# Patient Record
Sex: Female | Born: 1940 | Race: White | Hispanic: No | Smoking: Never smoker
Health system: Southern US, Community
[De-identification: ages and names within clinical notes are randomized; demographics above are authoritative.]

## PROBLEM LIST (undated history)

## (undated) DIAGNOSIS — C50919 Malignant neoplasm of unspecified site of unspecified female breast: Secondary | ICD-10-CM

## (undated) DIAGNOSIS — K219 Gastro-esophageal reflux disease without esophagitis: Secondary | ICD-10-CM

## (undated) DIAGNOSIS — M1990 Unspecified osteoarthritis, unspecified site: Secondary | ICD-10-CM

## (undated) DIAGNOSIS — S3210XA Unspecified fracture of sacrum, initial encounter for closed fracture: Secondary | ICD-10-CM

## (undated) DIAGNOSIS — I10 Essential (primary) hypertension: Secondary | ICD-10-CM

## (undated) DIAGNOSIS — E785 Hyperlipidemia, unspecified: Secondary | ICD-10-CM

## (undated) DIAGNOSIS — E039 Hypothyroidism, unspecified: Secondary | ICD-10-CM

## (undated) DIAGNOSIS — G3183 Dementia with Lewy bodies: Principal | ICD-10-CM

## (undated) DIAGNOSIS — F0280 Dementia in other diseases classified elsewhere without behavioral disturbance: Secondary | ICD-10-CM

## (undated) HISTORY — PX: CATARACT EXTRACTION W/ INTRAOCULAR LENS IMPLANT: SHX1309

## (undated) HISTORY — PX: ABDOMINAL HYSTERECTOMY: SHX81

## (undated) HISTORY — DX: Malignant neoplasm of unspecified site of unspecified female breast: C50.919

## (undated) HISTORY — PX: MASTECTOMY: SHX3

## (undated) HISTORY — DX: Hypothyroidism, unspecified: E03.9

## (undated) HISTORY — DX: Unspecified fracture of sacrum, initial encounter for closed fracture: S32.10XA

## (undated) HISTORY — DX: Dementia with Lewy bodies: G31.83

## (undated) HISTORY — PX: RETINAL DETACHMENT REPAIR W/ SCLERAL BUCKLE LE: SHX2338

## (undated) HISTORY — DX: Gastro-esophageal reflux disease without esophagitis: K21.9

## (undated) HISTORY — DX: Dementia in other diseases classified elsewhere, unspecified severity, without behavioral disturbance, psychotic disturbance, mood disturbance, and anxiety: F02.80

---

## 2008-01-31 ENCOUNTER — Ambulatory Visit: Payer: Self-pay | Admitting: Ophthalmology

## 2008-02-13 ENCOUNTER — Emergency Department: Payer: Self-pay | Admitting: Emergency Medicine

## 2008-02-14 ENCOUNTER — Other Ambulatory Visit: Payer: Self-pay

## 2008-02-26 ENCOUNTER — Ambulatory Visit: Payer: Self-pay | Admitting: Internal Medicine

## 2008-04-10 ENCOUNTER — Ambulatory Visit: Payer: Self-pay | Admitting: Internal Medicine

## 2008-05-11 ENCOUNTER — Ambulatory Visit: Payer: Self-pay | Admitting: Internal Medicine

## 2008-09-26 ENCOUNTER — Ambulatory Visit: Payer: Self-pay | Admitting: Family Medicine

## 2009-02-23 ENCOUNTER — Ambulatory Visit: Payer: Self-pay | Admitting: Nurse Practitioner

## 2009-07-13 ENCOUNTER — Ambulatory Visit: Payer: Self-pay | Admitting: Unknown Physician Specialty

## 2009-08-23 ENCOUNTER — Ambulatory Visit: Payer: Self-pay | Admitting: Internal Medicine

## 2009-09-15 ENCOUNTER — Ambulatory Visit: Payer: Self-pay | Admitting: Nurse Practitioner

## 2009-09-16 ENCOUNTER — Ambulatory Visit: Payer: Self-pay | Admitting: Unknown Physician Specialty

## 2010-03-04 ENCOUNTER — Ambulatory Visit: Payer: Self-pay | Admitting: Ophthalmology

## 2010-03-10 ENCOUNTER — Ambulatory Visit: Payer: Self-pay | Admitting: Ophthalmology

## 2010-05-18 ENCOUNTER — Ambulatory Visit: Payer: Self-pay | Admitting: Internal Medicine

## 2010-10-28 ENCOUNTER — Ambulatory Visit: Payer: Self-pay | Admitting: Internal Medicine

## 2010-10-28 ENCOUNTER — Emergency Department: Payer: Self-pay | Admitting: Emergency Medicine

## 2010-11-17 ENCOUNTER — Ambulatory Visit: Payer: Self-pay | Admitting: Ophthalmology

## 2011-07-02 ENCOUNTER — Ambulatory Visit: Payer: Self-pay | Admitting: Internal Medicine

## 2011-07-03 ENCOUNTER — Ambulatory Visit: Payer: Self-pay | Admitting: Family Medicine

## 2011-07-07 ENCOUNTER — Ambulatory Visit: Payer: Self-pay | Admitting: Family Medicine

## 2011-11-21 ENCOUNTER — Emergency Department: Payer: Self-pay | Admitting: Emergency Medicine

## 2011-12-05 ENCOUNTER — Ambulatory Visit: Payer: Self-pay | Admitting: Otolaryngology

## 2012-01-22 ENCOUNTER — Ambulatory Visit: Payer: Self-pay | Admitting: Internal Medicine

## 2012-01-22 LAB — URINALYSIS, COMPLETE

## 2012-01-24 LAB — URINE CULTURE

## 2012-07-01 ENCOUNTER — Ambulatory Visit: Payer: Self-pay | Admitting: Family Medicine

## 2012-07-01 LAB — URINALYSIS, COMPLETE
Glucose,UR: NEGATIVE mg/dL (ref 0–75)
Nitrite: NEGATIVE

## 2012-07-02 LAB — URINE CULTURE

## 2012-08-23 ENCOUNTER — Emergency Department: Payer: Self-pay | Admitting: *Deleted

## 2012-09-04 ENCOUNTER — Ambulatory Visit: Payer: Self-pay | Admitting: Family Medicine

## 2012-09-04 ENCOUNTER — Ambulatory Visit: Payer: Self-pay | Admitting: Orthopedic Surgery

## 2013-01-29 ENCOUNTER — Ambulatory Visit: Payer: Self-pay | Admitting: Family Medicine

## 2013-01-29 LAB — URINALYSIS, COMPLETE: WBC UR: >30 /HPF (ref 0–5)

## 2013-09-17 ENCOUNTER — Ambulatory Visit: Payer: Self-pay | Admitting: Family Medicine

## 2013-12-19 DIAGNOSIS — S3210XA Unspecified fracture of sacrum, initial encounter for closed fracture: Secondary | ICD-10-CM

## 2013-12-19 HISTORY — DX: Unspecified fracture of sacrum, initial encounter for closed fracture: S32.10XA

## 2014-01-01 ENCOUNTER — Emergency Department: Payer: Self-pay | Admitting: Emergency Medicine

## 2014-01-01 LAB — CBC WITH DIFFERENTIAL/PLATELET
Basophil #: 0 10*3/uL (ref 0.0–0.1)
Basophil %: 0.2 %
EOS PCT: 0.4 %
Eosinophil #: 0.1 10*3/uL (ref 0.0–0.7)
HCT: 37.5 % (ref 35.0–47.0)
HGB: 13 g/dL (ref 12.0–16.0)
Lymphocyte #: 1 10*3/uL (ref 1.0–3.6)
Lymphocyte %: 6.8 %
MCH: 34 pg (ref 26.0–34.0)
MCHC: 34.6 g/dL (ref 32.0–36.0)
MCV: 98 fL (ref 80–100)
Monocyte #: 0.6 x10 3/mm (ref 0.2–0.9)
Monocyte %: 3.8 %
NEUTROS PCT: 88.8 %
Neutrophil #: 13.4 10*3/uL — ABNORMAL HIGH (ref 1.4–6.5)
Platelet: 238 10*3/uL (ref 150–440)
RBC: 3.81 10*6/uL (ref 3.80–5.20)
RDW: 13.4 % (ref 11.5–14.5)
WBC: 15.1 10*3/uL — AB (ref 3.6–11.0)

## 2014-01-01 LAB — URINALYSIS, COMPLETE
Bilirubin,UR: NEGATIVE
Blood: NEGATIVE
GLUCOSE, UR: NEGATIVE mg/dL (ref 0–75)
Ketone: NEGATIVE
LEUKOCYTE ESTERASE: NEGATIVE
Nitrite: POSITIVE
PROTEIN: NEGATIVE
Ph: 7 (ref 4.5–8.0)
RBC,UR: 1 /HPF (ref 0–5)
Specific Gravity: 1.016 (ref 1.003–1.030)
Squamous Epithelial: 1
WBC UR: 2 /HPF (ref 0–5)

## 2014-01-01 LAB — BASIC METABOLIC PANEL
ANION GAP: 6 — AB (ref 7–16)
BUN: 11 mg/dL (ref 7–18)
CALCIUM: 8.3 mg/dL — AB (ref 8.5–10.1)
CHLORIDE: 106 mmol/L (ref 98–107)
Co2: 25 mmol/L (ref 21–32)
Creatinine: 0.63 mg/dL (ref 0.60–1.30)
EGFR (African American): >60
Glucose: 106 mg/dL — ABNORMAL HIGH (ref 65–99)
Osmolality: 274 (ref 275–301)
Potassium: 3.6 mmol/L (ref 3.5–5.1)
Sodium: 137 mmol/L (ref 136–145)

## 2014-01-01 LAB — CK TOTAL AND CKMB (NOT AT ARMC)
CK, Total: 66 U/L (ref 21–215)
CK-MB: 0.9 ng/mL (ref 0.5–3.6)

## 2014-01-01 LAB — TROPONIN I: Troponin-I: <0.02 ng/mL

## 2014-10-15 ENCOUNTER — Telehealth: Payer: Self-pay | Admitting: Internal Medicine

## 2014-10-15 NOTE — Telephone Encounter (Signed)
Left message on VM asking daughter to return my call.

## 2014-10-15 NOTE — Telephone Encounter (Signed)
Pt'S daugter would like a call back regarding the upcoming appt 11/17. She has a few questions about what will happen at this visit

## 2014-10-16 NOTE — Telephone Encounter (Signed)
.  left message to have patient's daughter return my call.  

## 2014-11-04 ENCOUNTER — Ambulatory Visit (INDEPENDENT_AMBULATORY_CARE_PROVIDER_SITE_OTHER): Payer: Medicare Other | Admitting: Internal Medicine

## 2014-11-04 ENCOUNTER — Encounter (INDEPENDENT_AMBULATORY_CARE_PROVIDER_SITE_OTHER): Payer: Self-pay

## 2014-11-04 ENCOUNTER — Encounter: Payer: Self-pay | Admitting: Internal Medicine

## 2014-11-04 VITALS — BP 120/70 | HR 84 | Temp 97.5°F | Ht 60.0 in | Wt 128.0 lb

## 2014-11-04 DIAGNOSIS — G3183 Dementia with Lewy bodies: Secondary | ICD-10-CM

## 2014-11-04 DIAGNOSIS — S3210XA Unspecified fracture of sacrum, initial encounter for closed fracture: Secondary | ICD-10-CM | POA: Insufficient documentation

## 2014-11-04 DIAGNOSIS — S3210XS Unspecified fracture of sacrum, sequela: Secondary | ICD-10-CM

## 2014-11-04 DIAGNOSIS — E039 Hypothyroidism, unspecified: Secondary | ICD-10-CM | POA: Insufficient documentation

## 2014-11-04 DIAGNOSIS — E890 Postprocedural hypothyroidism: Secondary | ICD-10-CM

## 2014-11-04 DIAGNOSIS — F0280 Dementia in other diseases classified elsewhere without behavioral disturbance: Secondary | ICD-10-CM | POA: Insufficient documentation

## 2014-11-04 DIAGNOSIS — K219 Gastro-esophageal reflux disease without esophagitis: Secondary | ICD-10-CM

## 2014-11-04 DIAGNOSIS — C50919 Malignant neoplasm of unspecified site of unspecified female breast: Secondary | ICD-10-CM | POA: Insufficient documentation

## 2014-11-04 MED ORDER — RIVASTIGMINE TARTRATE 3 MG PO CAPS: 3.0000 mg | ORAL_CAPSULE | Freq: Two times a day (BID) | ORAL | Status: DC

## 2014-11-04 NOTE — Progress Notes (Signed)
Subjective:    Patient ID: Julia Alvarado, female    DOB: [birth year], 73 y.o.   MRN: [id number]  HPI Here to establish Here with daughter Julia Alvarado and friend Julia Alvarado who stays with her 5 days per week Transferring due to referral to me due to her dementia She is home alone at night and weekends--family may stay with her if needed  Diagnosed with dementia -- from about a year ago Was seeing Dr Cathlean Marseilles ([facility] [place]) [place] yet seen neurologist Mild onset---not handling checkbook, repeating herself, etc Now with visual hallucinations--- sees people who are dead (mom and dad, husband, brother) Doesn't scare her Some concern for abnormal gait--Parkinsonism Had MRI of brain recently-- no CVA found  Has chronic pain in legs Since sacrum fracture 1/15 Not clear if gabapentin helps--may reduce some pain  Known hypothyroid--post op Had large cystic changes and it was removed  Breast cancer 06-Apr-2004 Right mastectomy and RT/chemo Then took med for years Sees oncologist in [place] Recent needle biopsies negative  GERD for a long time Quiet on the omeprazole Some dysphagia in past--- controlled now  No current outpatient prescriptions on file prior to visit.   No current facility-administered medications on file prior to visit.    Allergies  Allergen Reactions  . Cefdinir Other (See Comments)    Liquid form causes oral thrush  . Codeine Nausea And Vomiting    nausea  . Ibandronic Acid Nausea Only  . Latex Hives  . Nitrofurantoin     Other reaction(s): Vomiting  . Raloxifene Other (See Comments)    Per pt, "eye doctor said I cannot take"    Past Medical History  Diagnosis Date  . GERD (gastroesophageal reflux disease)   . Hypothyroidism   . Sacral fracture 1/15  . Lewy body dementia without behavioral disturbance   . Breast cancer     Past Surgical History  Procedure Laterality Date  . Mastectomy Right ~2005  . Retinal detachment repair w/ scleral buckle le     . Abdominal hysterectomy    . Cataract extraction w/ intraocular lens implant Right     Family History  Problem Relation Age of Onset  . Heart disease Father   . Cancer Brother     all except 1 died of cancer    History   Social History  . Marital Status: Widowed    Spouse Name: N/A    Number of Children: 2  . Years of Education: N/A   Occupational History  . [employer]     Retired   Social History Main Topics  . Smoking status: Never Smoker   . Smokeless tobacco: Never Used  . Alcohol Use: No  . Drug Use: No  . Sexual Activity: Not on file   Other Topics Concern  . Not on file   Social History Narrative   Widowed 04-06-2009   4 children--- 2 deceased      Has living will   Daughter and son have health care POA   Would accept resuscitation attempts but no prolonged ventilation   No feeding tube   Review of Systems  Constitutional: Negative for fatigue and unexpected weight change.  HENT: Positive for hearing loss. Negative for dental problem.   Eyes: Negative for visual disturbance.  Respiratory: Negative for cough, chest tightness and shortness of breath.   Cardiovascular: Positive for leg swelling. Negative for chest pain and palpitations.  Gastrointestinal: Negative for nausea, constipation and blood in stool.  Genitourinary: Negative for  dysuria, frequency and hematuria.  Musculoskeletal: Positive for back pain. Negative for arthralgias.  Skin: Negative for rash.  Neurological: Positive for dizziness, weakness and headaches. Negative for syncope.       Occ leg weakness  Psychiatric/Behavioral: Negative for sleep disturbance and dysphoric mood. The patient is nervous/anxious.        Regular nerves       Objective:   Physical Exam  Constitutional: She appears well-developed and well-nourished. No distress.  HENT:  Mouth/Throat: Oropharynx is clear and moist. No oropharyngeal exudate.  Neck: Normal range of motion. Neck supple. No thyromegaly present.    Cardiovascular: Normal rate, regular rhythm, normal heart sounds and intact distal pulses.  Exam reveals no gallop.   No murmur heard. Pulmonary/Chest: Effort normal and breath sounds normal. No respiratory distress. She has no wheezes. She has no rales.  Abdominal: Soft. There is no tenderness.  Musculoskeletal: She exhibits no edema or tenderness.  Lymphadenopathy:    She has no cervical adenopathy.  Neurological:  Knows name and birthday, knows address and phone number November 2015 President--- "Obama, ?" D-l-o-r...Marland KitchenMarland Kitchen. "I can't do that" [id number]-?  Mild truncal stiffness. Small steps but not overtly shuffling. No increased tone No tremor  Skin: No rash noted.  Psychiatric: She has a normal mood and affect. Her behavior is normal.          Assessment & Plan:

## 2014-11-04 NOTE — Progress Notes (Signed)
Pre visit review using our clinic review tool, if applicable. No additional management support is needed unless otherwise documented below in the visit note. 

## 2014-11-04 NOTE — Assessment & Plan Note (Signed)
Seems to be euthyroid Will check labs from other doctor

## 2014-11-04 NOTE — Assessment & Plan Note (Signed)
Still with some pain On gabapentin for this Will continue for now

## 2014-11-04 NOTE — Assessment & Plan Note (Signed)
Controlled on PPI °

## 2014-11-04 NOTE — Assessment & Plan Note (Signed)
Fairly classic presentation with memory issues, early hallucinations and Parkinsonism (though mild) Will get records from her previous physician Daughter already consulted with Lutricia Horsfall Will start rivastigmine Early counseling about long term care needs

## 2014-11-06 ENCOUNTER — Telehealth: Payer: Self-pay | Admitting: Internal Medicine

## 2014-12-08 ENCOUNTER — Ambulatory Visit: Payer: Medicare Other | Admitting: Internal Medicine

## 2014-12-09 ENCOUNTER — Telehealth: Payer: Self-pay | Admitting: Internal Medicine

## 2014-12-09 NOTE — Telephone Encounter (Signed)
Left message for daughter patricia paylor to call office

## 2014-12-09 NOTE — Telephone Encounter (Signed)
Patient did not come for their scheduled appointment 12/08/14 for 1 month follow up.  Please let me know if the patient needs to be contacted immediately for follow up or if no follow up is necessary.

## 2014-12-09 NOTE — Telephone Encounter (Signed)
She does need follow up soon You need to work through daughter since patient has dementia

## 2014-12-15 NOTE — Telephone Encounter (Signed)
Left message asking Julia Alvarado to call office

## 2014-12-22 NOTE — Telephone Encounter (Signed)
Left message asking Julia Alvarado to call office

## 2014-12-26 ENCOUNTER — Encounter: Payer: Self-pay | Admitting: Internal Medicine

## 2014-12-26 NOTE — Telephone Encounter (Signed)
Mailed letter °

## 2015-02-18 ENCOUNTER — Ambulatory Visit: Payer: Self-pay | Admitting: Nurse Practitioner

## 2015-06-15 ENCOUNTER — Telehealth: Payer: Self-pay | Admitting: *Deleted

## 2015-06-15 NOTE — Telephone Encounter (Signed)
Message left daughter to return my call about mammo.

## 2015-06-20 ENCOUNTER — Emergency Department: Payer: Medicare Other

## 2015-06-20 ENCOUNTER — Other Ambulatory Visit: Payer: Self-pay

## 2015-06-20 ENCOUNTER — Emergency Department
Admission: EM | Admit: 2015-06-20 | Discharge: 2015-06-20 | Disposition: A | Discharge status: Home / Self Care | Payer: Medicare Other | Attending: Emergency Medicine | Admitting: Emergency Medicine

## 2015-06-20 ENCOUNTER — Encounter: Payer: Self-pay | Admitting: Emergency Medicine

## 2015-06-20 DIAGNOSIS — N390 Urinary tract infection, site not specified: Secondary | ICD-10-CM | POA: Insufficient documentation

## 2015-06-20 DIAGNOSIS — Z91040 Latex allergy status: Secondary | ICD-10-CM | POA: Insufficient documentation

## 2015-06-20 DIAGNOSIS — F0280 Dementia in other diseases classified elsewhere without behavioral disturbance: Secondary | ICD-10-CM | POA: Insufficient documentation

## 2015-06-20 DIAGNOSIS — Z859 Personal history of malignant neoplasm, unspecified: Secondary | ICD-10-CM | POA: Diagnosis not present

## 2015-06-20 DIAGNOSIS — R079 Chest pain, unspecified: Secondary | ICD-10-CM | POA: Insufficient documentation

## 2015-06-20 DIAGNOSIS — Z9011 Acquired absence of right breast and nipple: Secondary | ICD-10-CM | POA: Diagnosis not present

## 2015-06-20 DIAGNOSIS — G3183 Dementia with Lewy bodies: Secondary | ICD-10-CM | POA: Diagnosis not present

## 2015-06-20 DIAGNOSIS — Z79899 Other long term (current) drug therapy: Secondary | ICD-10-CM | POA: Insufficient documentation

## 2015-06-20 LAB — URINALYSIS COMPLETE WITH MICROSCOPIC (ARMC ONLY)
BILIRUBIN URINE: NEGATIVE
GLUCOSE, UA: NEGATIVE mg/dL
Hgb urine dipstick: NEGATIVE
Ketones, ur: NEGATIVE mg/dL
Nitrite: NEGATIVE
Protein, ur: 30 mg/dL — AB
Specific Gravity, Urine: 1.03 (ref 1.005–1.030)
pH: 6 (ref 5.0–8.0)

## 2015-06-20 LAB — CBC
HCT: 34.3 % — ABNORMAL LOW (ref 35.0–47.0)
HEMOGLOBIN: 11.5 g/dL — AB (ref 12.0–16.0)
MCH: 34.2 pg — AB (ref 26.0–34.0)
MCHC: 33.6 g/dL (ref 32.0–36.0)
MCV: 101.6 fL — ABNORMAL HIGH (ref 80.0–100.0)
Platelets: 298 10*3/uL (ref 150–440)
RBC: 3.38 MIL/uL — ABNORMAL LOW (ref 3.80–5.20)
RDW: 14.2 % (ref 11.5–14.5)
WBC: 7.3 10*3/uL (ref 3.6–11.0)

## 2015-06-20 LAB — COMPREHENSIVE METABOLIC PANEL
ALT: 22 U/L (ref 14–54)
ANION GAP: 7 (ref 5–15)
AST: 20 U/L (ref 15–41)
Albumin: 4.4 g/dL (ref 3.5–5.0)
Alkaline Phosphatase: 64 U/L (ref 38–126)
BILIRUBIN TOTAL: 0.4 mg/dL (ref 0.3–1.2)
BUN: 10 mg/dL (ref 6–20)
CALCIUM: 9.3 mg/dL (ref 8.9–10.3)
CO2: 27 mmol/L (ref 22–32)
Chloride: 97 mmol/L — ABNORMAL LOW (ref 101–111)
Creatinine, Ser: 0.79 mg/dL (ref 0.44–1.00)
GLUCOSE: 104 mg/dL — AB (ref 65–99)
Potassium: 4.5 mmol/L (ref 3.5–5.1)
Sodium: 131 mmol/L — ABNORMAL LOW (ref 135–145)
TOTAL PROTEIN: 7.3 g/dL (ref 6.5–8.1)

## 2015-06-20 LAB — TROPONIN I: Troponin I: <0.03 ng/mL (ref <0.031)

## 2015-06-20 MED ORDER — CEPHALEXIN 500 MG PO CAPS
ORAL_CAPSULE | ORAL | Status: AC
  Filled 2015-06-20: qty 1

## 2015-06-20 MED ORDER — CEPHALEXIN 500 MG PO CAPS
500.0000 mg | ORAL_CAPSULE | Freq: Once | ORAL | Status: AC
  Administered 2015-06-20: 500 mg via ORAL

## 2015-06-20 MED ORDER — CEPHALEXIN 500 MG PO CAPS: 500.0000 mg | ORAL_CAPSULE | Freq: Three times a day (TID) | ORAL | Status: AC

## 2015-06-20 MED ORDER — BACITRACIN ZINC 500 UNIT/GM EX OINT: TOPICAL_OINTMENT | CUTANEOUS | Status: AC

## 2015-06-20 NOTE — ED Notes (Signed)
Patient transported to X-ray 

## 2015-06-20 NOTE — ED Notes (Signed)
Pt alert and oriented X4, active, cooperative, pt in NAD. RR even and unlabored, color WNL.  Pt informed to return if any life threatening symptoms occur.  Left with family members who are taking patient back to [facility].

## 2015-06-20 NOTE — ED Notes (Signed)
MD at bedside. 

## 2015-06-20 NOTE — ED Notes (Signed)
States felt "swimmy headed" while at assisted living, states symptoms have subsided

## 2015-06-20 NOTE — ED Notes (Signed)
Pt c/o chest pain that began last night, feeling light headed this AM. State CP is still present. Pt concerned about her bilateral great toes, wrapped with bandaids on arrival, color WNL.

## 2015-06-20 NOTE — ED Provider Notes (Signed)
[facility] Emergency Department Provider Note  ____________________________________________  Time seen: Approximately 150 PM  I have reviewed the triage vital signs and the nursing notes.   HISTORY  Chief Complaint Near Syncope    HPI Julia Alvarado is a 74 y.o. female who is presenting today with multiple complaints. Her triage complaint was chest pain for one day with lightheadedness. She did not have any syncopal episodes. She does not complain of any lightheadedness at this time but is complaining of right-sided chest pain which is constant and over her old surgical site where she had a right-sided mastectomy. She is also asking about her toe pain which is been a chronic issue over the last 2 months. She denies any shortness of breath. She is accompanied by her son and granddaughter who say that she has had worsening dementia over the past several months.They said that she has had urinary tract infections and she is very confused. The son says that she is not acting today like she is having a urinary tract infection despite her multiple complaints.   Past Medical History  Diagnosis Date  . GERD (gastroesophageal reflux disease)   . Hypothyroidism   . Sacral fracture 1/15  . Lewy body dementia without behavioral disturbance   . Breast cancer   . Breast cancer     Patient Active Problem List   Diagnosis Date Noted  . GERD (gastroesophageal reflux disease)   . Hypothyroidism   . Sacral fracture   . Lewy body dementia without behavioral disturbance   . Breast cancer     Past Surgical History  Procedure Laterality Date  . Mastectomy Right ~2005  . Retinal detachment repair w/ scleral buckle le    . Abdominal hysterectomy    . Cataract extraction w/ intraocular lens implant Right   . Mastectomy      Current Outpatient Rx  Name  Route  Sig  Dispense  Refill  . dorzolamide-timolol (COSOPT) 22.3-6.8 MG/ML ophthalmic solution   Right Eye   Place  1 drop into the right eye daily.          Marland Kitchen gabapentin (NEURONTIN) 300 MG capsule   Oral   Take 300 mg by mouth 2 (two) times daily.          Marland Kitchen glucosamine-chondroitin (GLUCOSAMINE-CHONDROITIN DS) 500-400 MG tablet   Oral   Take 1 tablet by mouth 2 (two) times daily.          Marland Kitchen latanoprost (XALATAN) 0.005 % ophthalmic solution   Right Eye   Place 1 drop into the right eye at bedtime. 1 drop nightly.         . Omega-3 Fatty Acids (FISH OIL) 1000 MG CAPS   Oral   Take 2 capsules by mouth 2 (two) times daily.          Marland Kitchen omeprazole (PRILOSEC) 20 MG capsule   Oral   Take 20 mg by mouth 2 (two) times daily before a meal.          . rivastigmine (EXELON) 3 MG capsule   Oral   Take 1 capsule (3 mg total) by mouth 2 (two) times daily.   60 capsule   1   . SYNTHROID 100 MCG tablet   Oral   Take 100 mcg by mouth daily before breakfast.            Allergies Cefdinir; Codeine; Ibandronic acid; Latex; Nitrofurantoin; and Raloxifene  Family History  Problem Relation Age of Onset  .  Heart disease Father   . Cancer Brother     all except 1 died of cancer    Social History History  Substance Use Topics  . Smoking status: Never Smoker   . Smokeless tobacco: Never Used  . Alcohol Use: No    Review of Systems Constitutional: No fever/chills Eyes: No visual changes. ENT: No sore throat. Cardiovascular: As above Respiratory: Denies shortness of breath. Gastrointestinal: No abdominal pain.  No nausea, no vomiting.  No diarrhea.  No constipation. Genitourinary: Negative for dysuria. Musculoskeletal: Negative for back pain. Skin: Negative for rash. Neurological: Negative for headaches, focal weakness or numbness.  10-point ROS otherwise negative.  ____________________________________________   PHYSICAL EXAM:  VITAL SIGNS: ED Triage Vitals  Enc Vitals Group     BP 06/20/15 1255 132/86 mmHg     Pulse Rate 06/20/15 1255 66     Resp 06/20/15 1255 20      Temp 06/20/15 1255 98.3 F (36.8 C)     Temp Source 06/20/15 1255 Oral     SpO2 06/20/15 1255 100 %     Weight 06/20/15 1255 135 lb (61.236 kg)     Height 06/20/15 1255 5\' 3"  (1.6 m)     Head Cir --      Peak Flow --      Pain Score 06/20/15 1256 0     Pain Loc --      Pain Edu? --      Excl. in [place]? --     Constitutional: Alert and oriented. Well appearing and in no acute distress. Eyes: Conjunctivae are normal. PERRL. EOMI. Head: Atraumatic. Nose: No congestion/rhinnorhea. Mouth/Throat: Mucous membranes are moist.  Oropharynx non-erythematous. Neck: No stridor.   Cardiovascular: Normal rate, regular rhythm. Grossly normal heart sounds.  Good peripheral circulation. Tender to palpation over the right-sided mastectomy site. There is no crepitus. No erythema or deformity visualized. Respiratory: Normal respiratory effort.  No retractions. Lungs CTAB. Gastrointestinal: Soft and nontender. No distention. No abdominal bruits. No CVA tenderness. Musculoskeletal: No lower extremity tenderness nor edema.  No joint effusions. No lesions to the feet or toes. Neurologic:  Normal speech and language. No gross focal neurologic deficits are appreciated. Speech is normal. No gait instability. Skin:  Skin is warm, dry and intact. No rash noted. Psychiatric: Mood and affect are normal. Speech and behavior are normal.  ____________________________________________   LABS (all labs ordered are listed, but only abnormal results are displayed)  Labs Reviewed  CBC - Abnormal; Notable for the following:    RBC 3.38 (*)    Hemoglobin 11.5 (*)    HCT 34.3 (*)    MCV 101.6 (*)    MCH 34.2 (*)    All other components within normal limits  COMPREHENSIVE METABOLIC PANEL - Abnormal; Notable for the following:    Sodium 131 (*)    Chloride 97 (*)    Glucose, Bld 104 (*)    All other components within normal limits  URINALYSIS COMPLETEWITH MICROSCOPIC ([facility] ONLY) - Abnormal; Notable for the following:     Color, Urine YELLOW (*)    APPearance CLEAR (*)    Protein, ur 30 (*)    Leukocytes, UA 3+ (*)    Bacteria, UA FEW (*)    Squamous Epithelial / LPF 0-5 (*)    All other components within normal limits  TROPONIN I   ____________________________________________  EKG  ED ECG REPORT I, Doran Stabler, the attending physician, personally viewed and interpreted this ECG.  Date: 06/20/2015  EKG Time: 1308  Rate: 59  Rhythm: sinus bradycardia  Axis: Normal axis  Intervals:none  ST&T Change: No ST elevations or depressions. No abnormal T-wave inversions.  ____________________________________________  RADIOLOGY  Chest x-ray with changes consistent with COPD. I personally reviewed these images. ____________________________________________   PROCEDURES   ____________________________________________   INITIAL IMPRESSION / ASSESSMENT AND PLAN / ED COURSE  Pertinent labs & imaging results that were available during my care of the patient were reviewed by me and considered in my medical decision making (see chart for details).  ----------------------------------------- 3:21 PM on 06/20/2015 -----------------------------------------  Patient with UA consistent with urinary tract infection. We'll treat with Keflex. Patient not complaining of chest pain at all this time. Perseverating over her right great toe where on the medial side she has had procedures done by her podiatrist for ingrown toenails. There appears to be a small amount of dried blood under the medial border of the toe but without raising the nail. There is no surrounding induration or pus. There is no tenderness on palpation. She is asking for something that will help the healing process since her procedure that her podiatrist. I will prescribe her bacitracin. The family was also told that they may be picked up Neosporin over-the-counter. Patient not complaining of chest pain or shortness of breath at all at this  time. Appears well. We'll discharge to home. Heart score of 3 which makes this patient a low risk patient. Will give follow-up with cardiology. ____________________________________________   FINAL CLINICAL IMPRESSION(S) / ED DIAGNOSES  Acute chest pain. Acute urinary tract infection. Initial visit.    Orbie Pyo, MD 06/20/15 [id number]

## 2015-06-23 ENCOUNTER — Telehealth: Payer: Self-pay | Admitting: *Deleted

## 2015-06-23 NOTE — Telephone Encounter (Signed)
Pt is now in [facility] and see doctors making house calls, spoke with grand-daughter.

## 2015-06-24 NOTE — Telephone Encounter (Signed)
Okay I hadn't heard that I don't think  Please take my name off as PCP

## 2015-07-23 ENCOUNTER — Encounter: Payer: Self-pay | Admitting: Emergency Medicine

## 2015-07-23 ENCOUNTER — Emergency Department: Payer: Medicare Other

## 2015-07-23 ENCOUNTER — Emergency Department
Admission: EM | Admit: 2015-07-23 | Discharge: 2015-07-23 | Disposition: A | Discharge status: Home / Self Care | Payer: Medicare Other | Attending: Emergency Medicine | Admitting: Emergency Medicine

## 2015-07-23 DIAGNOSIS — Z79899 Other long term (current) drug therapy: Secondary | ICD-10-CM | POA: Diagnosis not present

## 2015-07-23 DIAGNOSIS — F0280 Dementia in other diseases classified elsewhere without behavioral disturbance: Secondary | ICD-10-CM | POA: Insufficient documentation

## 2015-07-23 DIAGNOSIS — Z791 Long term (current) use of non-steroidal anti-inflammatories (NSAID): Secondary | ICD-10-CM | POA: Diagnosis not present

## 2015-07-23 DIAGNOSIS — Y92128 Other place in nursing home as the place of occurrence of the external cause: Secondary | ICD-10-CM | POA: Diagnosis not present

## 2015-07-23 DIAGNOSIS — Y998 Other external cause status: Secondary | ICD-10-CM | POA: Insufficient documentation

## 2015-07-23 DIAGNOSIS — Z8659 Personal history of other mental and behavioral disorders: Secondary | ICD-10-CM | POA: Diagnosis not present

## 2015-07-23 DIAGNOSIS — S3992XA Unspecified injury of lower back, initial encounter: Secondary | ICD-10-CM | POA: Insufficient documentation

## 2015-07-23 DIAGNOSIS — G3183 Dementia with Lewy bodies: Secondary | ICD-10-CM | POA: Insufficient documentation

## 2015-07-23 DIAGNOSIS — S0990XA Unspecified injury of head, initial encounter: Secondary | ICD-10-CM | POA: Diagnosis present

## 2015-07-23 DIAGNOSIS — Z79811 Long term (current) use of aromatase inhibitors: Secondary | ICD-10-CM | POA: Diagnosis not present

## 2015-07-23 DIAGNOSIS — W01198A Fall on same level from slipping, tripping and stumbling with subsequent striking against other object, initial encounter: Secondary | ICD-10-CM | POA: Diagnosis not present

## 2015-07-23 DIAGNOSIS — S0003XA Contusion of scalp, initial encounter: Secondary | ICD-10-CM | POA: Diagnosis not present

## 2015-07-23 DIAGNOSIS — M545 Low back pain, unspecified: Secondary | ICD-10-CM

## 2015-07-23 DIAGNOSIS — Z91040 Latex allergy status: Secondary | ICD-10-CM | POA: Diagnosis not present

## 2015-07-23 DIAGNOSIS — Y9389 Activity, other specified: Secondary | ICD-10-CM | POA: Diagnosis not present

## 2015-07-23 MED ORDER — ONDANSETRON 4 MG PO TBDP
4.0000 mg | ORAL_TABLET | Freq: Once | ORAL | Status: AC
  Administered 2015-07-23: 4 mg via ORAL
  Filled 2015-07-23: qty 1

## 2015-07-23 MED ORDER — HYDROCODONE-ACETAMINOPHEN 5-325 MG PO TABS
1.0000 | ORAL_TABLET | Freq: Once | ORAL | Status: AC
Start: 2015-07-23 — End: 2015-07-23
  Administered 2015-07-23: 1 via ORAL
  Filled 2015-07-23: qty 1

## 2015-07-23 NOTE — Discharge Instructions (Signed)
Head Injury °You have received a head injury. It does not appear serious at this time. Headaches and vomiting are common following head injury. It should be easy to awaken from sleeping. Sometimes it is necessary for you to stay in the emergency department for a while for observation. Sometimes admission to the hospital may be needed. After injuries such as yours, most problems occur within the first 24 hours, but side effects may occur up to 7-10 days after the injury. It is important for you to carefully monitor your condition and contact your health care provider or seek immediate medical care if there is a change in your condition. °WHAT ARE THE TYPES OF HEAD INJURIES? °Head injuries can be as minor as a bump. Some head injuries can be more severe. More severe head injuries include: °· A jarring injury to the brain (concussion). °· A bruise of the brain (contusion). This mean there is bleeding in the brain that can cause swelling. °· A cracked skull (skull fracture). °· Bleeding in the brain that collects, clots, and forms a bump (hematoma). °WHAT CAUSES A HEAD INJURY? °A serious head injury is most likely to happen to someone who is in a car wreck and is not wearing a seat belt. Other causes of major head injuries include bicycle or motorcycle accidents, sports injuries, and falls. °HOW ARE HEAD INJURIES DIAGNOSED? °A complete history of the event leading to the injury and your current symptoms will be helpful in diagnosing head injuries. Many times, pictures of the brain, such as CT or MRI are needed to see the extent of the injury. Often, an overnight hospital stay is necessary for observation.  °WHEN SHOULD I SEEK IMMEDIATE MEDICAL CARE?  °You should get help right away if: °· You have confusion or drowsiness. °· You feel sick to your stomach (nauseous) or have continued, forceful vomiting. °· You have dizziness or unsteadiness that is getting worse. °· You have severe, continued headaches not relieved by  medicine. Only take over-the-counter or prescription medicines for pain, fever, or discomfort as directed by your health care provider. °· You do not have normal function of the arms or legs or are unable to walk. °· You notice changes in the black spots in the center of the colored part of your eye (pupil). °· You have a clear or bloody fluid coming from your nose or ears. °· You have a loss of vision. °During the next 24 hours after the injury, you must stay with someone who can watch you for the warning signs. This person should contact local emergency services (911 in the [place] if you have seizures, you become unconscious, or you are unable to wake up. °HOW CAN I PREVENT A HEAD INJURY IN THE FUTURE? °The most important factor for preventing major head injuries is avoiding motor vehicle accidents.  To minimize the potential for damage to your head, it is crucial to wear seat belts while riding in motor vehicles. Wearing helmets while bike riding and playing collision sports (like football) is also helpful. Also, avoiding dangerous activities around the house will further help reduce your risk of head injury.  °WHEN CAN I RETURN TO NORMAL ACTIVITIES AND ATHLETICS? °You should be reevaluated by your health care provider before returning to these activities. If you have any of the following symptoms, you should not return to activities or contact sports until 1 week after the symptoms have stopped: °· Persistent headache. °· Dizziness or vertigo. °· Poor attention and concentration. °· Confusion. °·   Memory problems.  Nausea or vomiting.  Fatigue or tire easily.  Irritability.  Intolerant of bright lights or loud noises.  Anxiety or depression.  Disturbed sleep. MAKE SURE YOU:   Understand these instructions.  Will watch your condition.  Will get help right away if you are not doing well or get worse. Document Released: 12/05/2005 Document Revised: 12/10/2013 Document Reviewed:  08/12/2013 [facility] Patient Information 2015 [employer], [place]. This information is not intended to replace advice given to you by your health care provider. Make sure you discuss any questions you have with your health care provider. Return immediately if condition worsens, please take Tylenol or Motrin for pain. Neck primary physician for further outpatient evaluation as needed. No acute abnormalities were cited on CAT scan evaluation of the head, neck, and plain films of the lumbar spine.

## 2015-07-23 NOTE — ED Notes (Signed)
Pt used bedpan 

## 2015-07-23 NOTE — ED Notes (Signed)
Pt returns from CT with family at bedside.

## 2015-07-23 NOTE — ED Notes (Signed)
edp at bedside to clear pt from backboard with assist from 3 other staff members. Pt remains in [facility] until xrays are clear.

## 2015-07-23 NOTE — ED Notes (Signed)
Patient transported to CT. Pt given PO zofran for nausea. Pt continues to have episodes of nausea. Will reassess nausea prior to giving pain meds.

## 2015-07-23 NOTE — ED Notes (Signed)
Pt from [facility] to ed via ems with reports of having a fall. Pt was walking down hall, fell and hit the left side of head on the wall with leaving an indention in the wall. Pt fully immobilzed on arrival to ed with c/o low back pain and headache. Hematoma palpated to left side lateral head, no bleeding noted. Pt alert and oriented to self and place. Pt experienced one episode of nausea on arrival to ed with no vomiting notedl. vss.

## 2015-07-23 NOTE — ED Provider Notes (Signed)
[facility] Emergency Department Provider Note  ____________________________________________  Time seen: 33  I have reviewed the triage vital signs and the nursing notes.   HISTORY  Chief Complaint Fall   { HPI Julia Alvarado is a 74 y.o. female who presents after a non-syncopal fall local nursing facility. Patient apparently turned and lost her balance and struck the left side of her head on a wall showing an indentation in the wall. Patient was transported by EMS on board and collared and brought here to the emergency department. The patient primarily complains of low back pain. According to nursing staff she has a long history of complaints of back pain and there was no obvious indications of direct trauma to the back. Patient also has a known history of dementia and seems to be at her baseline mental status.  {  Past Medical History  Diagnosis Date  . GERD (gastroesophageal reflux disease)   . Hypothyroidism   . Sacral fracture 1/15  . Lewy body dementia without behavioral disturbance   . Breast cancer   . Breast cancer     Patient Active Problem List   Diagnosis Date Noted  . GERD (gastroesophageal reflux disease)   . Hypothyroidism   . Sacral fracture   . Lewy body dementia without behavioral disturbance   . Breast cancer     Past Surgical History  Procedure Laterality Date  . Mastectomy Right ~2005  . Retinal detachment repair w/ scleral buckle le    . Abdominal hysterectomy    . Cataract extraction w/ intraocular lens implant Right   . Mastectomy      Current Outpatient Rx  Name  Route  Sig  Dispense  Refill  . citalopram (CELEXA) 20 MG tablet   Oral   Take 20 mg by mouth daily.         . dorzolamide-timolol (COSOPT) 22.3-6.8 MG/ML ophthalmic solution   Right Eye   Place 1 drop into the right eye 2 (two) times daily.         . fluticasone (FLONASE) 50 MCG/ACT nasal spray   Each Nare   Place into both nostrils daily.          Marland Kitchen gabapentin (NEURONTIN) 100 MG capsule   Oral   Take 100 mg by mouth 3 (three) times daily. Take 1 capsule twice daily and then take 2 capsules at bedtime.         Marland Kitchen glucosamine-chondroitin (GLUCOSAMINE-CHONDROITIN DS) 500-400 MG tablet   Oral   Take 1 tablet by mouth 3 (three) times daily.         . GuaiFENesin (COUGH SYRUP PO)   Oral   Take 10 mLs by mouth every 6 (six) hours as needed.         Marland Kitchen ibuprofen (ADVIL,MOTRIN) 400 MG tablet   Oral   Take 400 mg by mouth 3 (three) times daily.         Marland Kitchen ibuprofen (ADVIL,MOTRIN) 800 MG tablet   Oral   Take 800 mg by mouth every 8 (eight) hours as needed for mild pain.         Marland Kitchen latanoprost (XALATAN) 0.005 % ophthalmic solution   Both Eyes   Place 1 drop into both eyes at bedtime.         Marland Kitchen levothyroxine (SYNTHROID, LEVOTHROID) 100 MCG tablet   Oral   Take 100 mcg by mouth daily before breakfast.         . lisinopril (PRINIVIL,ZESTRIL) 5 MG  tablet   Oral   Take 5 mg by mouth daily.         Marland Kitchen LORazepam (ATIVAN) 0.5 MG tablet   Oral   Take 0.5 mg by mouth every 8 (eight) hours as needed for anxiety.         . magnesium sulfate (EPSOM SALTS) crystals   Topical   Apply 1 application topically 2 (two) times daily. Two times daily for two weeks.         . Melatonin 3 MG CAPS   Oral   Take 1 capsule by mouth at bedtime.         . Omega-3 Fatty Acids (FISH OIL) 1000 MG CAPS   Oral   Take 1 capsule by mouth daily.         . sodium chloride (OCEAN) 0.65 % SOLN nasal spray   Each Nare   Place 1 spray into both nostrils 5 (five) times daily.         . traMADol (ULTRAM) 50 MG tablet   Oral   Take 50 mg by mouth every 6 (six) hours as needed.         . bacitracin ointment      Apply to affected area daily for 1-2 weeks.   30 g   0   . rivastigmine (EXELON) 3 MG capsule   Oral   Take 1 capsule (3 mg total) by mouth 2 (two) times daily.   60 capsule   1     Allergies Cefdinir;  Codeine; Ibandronic acid; Latex; Nitrofurantoin; and Raloxifene  Family History  Problem Relation Age of Onset  . Heart disease Father   . Cancer Brother     all except 1 died of cancer    Social History History  Substance Use Topics  . Smoking status: Never Smoker   . Smokeless tobacco: Never Used  . Alcohol Use: No    Review of Systems  Patient denies any complaints of arm, or thoracic spine pain patient denies any pain or swelling in the upper or lower extremities. There is no previous symptoms such as chest pain or near syncope stated by the patient at the scene. Patient does describe some nausea on arrival.   ____________________________________________   PHYSICAL EXAM:  VITAL SIGNS: ED Triage Vitals  Enc Vitals Group     BP 07/23/15 0902 152/61 mmHg     Pulse Rate 07/23/15 0902 61     Resp 07/23/15 0902 16     Temp 07/23/15 0902 98.2 F (36.8 C)     Temp Source 07/23/15 0902 Oral     SpO2 07/23/15 0902 97 %     Weight 07/23/15 0902 138 lb (62.596 kg)     Height 07/23/15 0902 5\' 6"  (1.676 m)     Head Cir --      Peak Flow --      Pain Score 07/23/15 0905 5     Pain Loc --      Pain Edu? --      Excl. in [place]? --     { Constitutional: Alert and oriented. Well appearing and in no distress. Eyes: Conjunctivae are normal.  ENT   Head: Patient has a 2 cm left parietal hematoma with no overlying bleeding or laceration.  Mouth/Throat: Mucous membranes are moist. Neck is supple. Collar was removed after CAT scan evaluation with no crepitus or tenderness noted. Cardiovascular: Normal rate, regular rhythm. Normal and symmetric distal pulses are present in all extremities.  No murmurs, rubs, or gallops. Respiratory: Normal respiratory effort without tachypnea nor retractions. Breath sounds are clear and equal bilaterally.  Gastrointestinal: Soft and non-tender in all quadrants. No distention. There is no CVA tenderness. Genitourinary: deferred Musculoskeletal:  Nontender with normal range of motion in all extremities. No lower extremity tenderness nor edema. Neurologic:  Normal speech and language. No gross focal neurologic deficits are appreciated. Skin:  Skin is warm, dry and intact. No rash noted. Psychiatric: Patient has baseline dementia and according to the family is at her normal mental status. Patient was log rolled and had palpation of the thoracic or lumbar spine but no crepitus or step-off noted. No posterior contusions or abrasions are noted. ____________________________________________    LABS (pertinent positives/negatives)  Labs Reviewed - No data to display  ____________________________________________   EKG  none  ____________________________________________    RADIOLOGY I have personally reviewed any xrays that were ordered on this patient: Patient had head CT, cervical spine CAT scan and lumbar spine plain films which did not show any significant abnormalities. Of note was a left parietal hematoma.  ____________________________________________   PROCEDURES  Procedure(s) performed: none  Critical Care performed: None  ____________________________________________   INITIAL IMPRESSION / ASSESSMENT AND PLAN / ED COURSE  Pertinent labs & imaging results that were available during my care of the patient were reviewed by me and considered in my medical decision making (see chart for details). Patient's stay was uneventful and she was able to ambulate with assistance. After her x-ray and CAT scan reviewed as collar was removed and the patient was able to demonstrate no significant neuropathy. Patient was taken back to the nursing facility by her family. All questions and concerns were addressed.  ____________________________________________   FINAL CLINICAL IMPRESSION(S) / ED DIAGNOSES  Final diagnoses:  Head trauma, initial encounter  Back pain at L4-L5 level     Daymon Larsen, MD 07/23/15 [phone]

## 2015-07-27 ENCOUNTER — Other Ambulatory Visit
Admission: RE | Admit: 2015-07-27 | Discharge: 2015-07-27 | Disposition: A | Discharge status: Home / Self Care | Payer: Medicare Other | Source: Ambulatory Visit | Attending: Nurse Practitioner | Admitting: Nurse Practitioner

## 2015-07-27 DIAGNOSIS — R410 Disorientation, unspecified: Secondary | ICD-10-CM | POA: Diagnosis present

## 2015-07-27 LAB — URINALYSIS COMPLETE WITH MICROSCOPIC (ARMC ONLY)
Bilirubin Urine: NEGATIVE
GLUCOSE, UA: NEGATIVE mg/dL
HGB URINE DIPSTICK: NEGATIVE
Ketones, ur: NEGATIVE mg/dL
Nitrite: POSITIVE — AB
Protein, ur: NEGATIVE mg/dL
Specific Gravity, Urine: 1.005 (ref 1.005–1.030)
pH: 6 (ref 5.0–8.0)

## 2015-07-29 LAB — URINE CULTURE

## 2015-08-15 ENCOUNTER — Emergency Department
Admission: EM | Admit: 2015-08-15 | Discharge: 2015-08-15 | Disposition: A | Discharge status: Home / Self Care | Payer: Medicare Other | Attending: Emergency Medicine | Admitting: Emergency Medicine

## 2015-08-15 ENCOUNTER — Encounter: Payer: Self-pay | Admitting: Emergency Medicine

## 2015-08-15 ENCOUNTER — Emergency Department: Payer: Medicare Other

## 2015-08-15 DIAGNOSIS — F0280 Dementia in other diseases classified elsewhere without behavioral disturbance: Secondary | ICD-10-CM | POA: Diagnosis not present

## 2015-08-15 DIAGNOSIS — Z79899 Other long term (current) drug therapy: Secondary | ICD-10-CM | POA: Diagnosis not present

## 2015-08-15 DIAGNOSIS — Y9389 Activity, other specified: Secondary | ICD-10-CM | POA: Insufficient documentation

## 2015-08-15 DIAGNOSIS — Y998 Other external cause status: Secondary | ICD-10-CM | POA: Diagnosis not present

## 2015-08-15 DIAGNOSIS — Y9289 Other specified places as the place of occurrence of the external cause: Secondary | ICD-10-CM | POA: Insufficient documentation

## 2015-08-15 DIAGNOSIS — Z792 Long term (current) use of antibiotics: Secondary | ICD-10-CM | POA: Diagnosis not present

## 2015-08-15 DIAGNOSIS — Z791 Long term (current) use of non-steroidal anti-inflammatories (NSAID): Secondary | ICD-10-CM | POA: Diagnosis not present

## 2015-08-15 DIAGNOSIS — S42214A Unspecified nondisplaced fracture of surgical neck of right humerus, initial encounter for closed fracture: Secondary | ICD-10-CM | POA: Diagnosis not present

## 2015-08-15 DIAGNOSIS — S42201A Unspecified fracture of upper end of right humerus, initial encounter for closed fracture: Secondary | ICD-10-CM

## 2015-08-15 DIAGNOSIS — S4991XA Unspecified injury of right shoulder and upper arm, initial encounter: Secondary | ICD-10-CM | POA: Diagnosis present

## 2015-08-15 DIAGNOSIS — Z91040 Latex allergy status: Secondary | ICD-10-CM | POA: Insufficient documentation

## 2015-08-15 DIAGNOSIS — G3183 Dementia with Lewy bodies: Secondary | ICD-10-CM | POA: Diagnosis not present

## 2015-08-15 DIAGNOSIS — W1839XA Other fall on same level, initial encounter: Secondary | ICD-10-CM | POA: Insufficient documentation

## 2015-08-15 MED ORDER — TRAMADOL HCL 50 MG PO TABS: 100.0000 mg | ORAL_TABLET | Freq: Once | ORAL | Status: DC
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
## 2015-08-15 NOTE — Discharge Instructions (Signed)
As we discussed, Julia Alvarado has a fracture of the upper part of her upper arm.  It is not expected that she will need surgery but she needs to wear her shoulder immobilizer to keep the bones together and allow them to begin to heal.  She should follow-up with Dr. Sabra Heck next week, in about 5 days.  Please have her continue taking over-the-counter pain medicine as needed.  He should also try to follow up with her regular doctor on Monday to discuss additional long-term care options given her apparently worsening dementia.   Humerus Fracture, Treated with Immobilization The humerus is the large bone in your upper arm. You have a broken (fractured) humerus. These fractures are easily diagnosed with X-rays. TREATMENT  Simple fractures which will heal without disability are treated with simple immobilization. Immobilization means you will wear a cast, splint, or sling. You have a fracture which will do well with immobilization. The fracture will heal well simply by being held in a good position until it is stable enough to begin range of motion exercises. Do not take part in activities which would further injure your arm.  HOME CARE INSTRUCTIONS   Put ice on the injured area.  Put ice in a plastic bag.  Place a towel between your skin and the bag.  Leave the ice on for 15-20 minutes, 03-04 times a day.  If you have a cast:  Do not scratch the skin under the cast using sharp or pointed objects.  Check the skin around the cast every day. You may put lotion on any red or sore areas.  Keep your cast dry and clean.  If you have a splint:  Wear the splint as directed.  Keep your splint dry and clean.  You may loosen the elastic around the splint if your fingers become numb, tingle, or turn cold or blue.  If you have a sling:  Wear the sling as directed.  Do not put pressure on any part of your cast or splint until it is fully hardened.  Your cast or splint can be protected during  bathing with a plastic bag. Do not lower the cast or splint into water.  Only take over-the-counter or prescription medicines for pain, discomfort, or fever as directed by your caregiver.  Do range of motion exercises as instructed by your caregiver.  Follow up as directed by your caregiver. This is very important in order to avoid permanent injury or disability and chronic pain. SEEK IMMEDIATE MEDICAL CARE IF:   Your skin or nails in the injured arm turn blue or gray.  Your arm feels cold or numb.  You develop severe pain in the injured arm.  You are having problems with the medicines you were given. MAKE SURE YOU:   Understand these instructions.  Will watch your condition.  Will get help right away if you are not doing well or get worse. Document Released: 03/13/2001 Document Revised: 02/27/2012 Document Reviewed: 01/19/2011 [employer] Patient Information 2015 [place], [place]. This information is not intended to replace advice given to you by your health care provider. Make sure you discuss any questions you have with your health care provider.

## 2015-08-15 NOTE — ED Notes (Signed)
Iv reported in computer from July d/c'd from computer

## 2015-08-17 ENCOUNTER — Telehealth: Payer: Self-pay

## 2015-08-17 LAB — HM MAMMOGRAPHY

## 2015-08-17 NOTE — Telephone Encounter (Signed)
Patient declined information on Mammogram.

## 2016-01-01 ENCOUNTER — Other Ambulatory Visit: Payer: Self-pay | Admitting: Unknown Physician Specialty

## 2016-01-01 DIAGNOSIS — R1310 Dysphagia, unspecified: Secondary | ICD-10-CM

## 2016-02-29 ENCOUNTER — Ambulatory Visit
Admission: RE | Admit: 2016-02-29 | Discharge: 2016-02-29 | Disposition: A | Discharge status: Home / Self Care | Payer: Medicare Other | Source: Ambulatory Visit | Attending: Unknown Physician Specialty | Admitting: Unknown Physician Specialty

## 2016-02-29 DIAGNOSIS — R1312 Dysphagia, oropharyngeal phase: Secondary | ICD-10-CM

## 2016-02-29 DIAGNOSIS — R1013 Epigastric pain: Secondary | ICD-10-CM | POA: Diagnosis not present

## 2016-02-29 DIAGNOSIS — R1310 Dysphagia, unspecified: Secondary | ICD-10-CM

## 2016-02-29 NOTE — Therapy (Signed)
[place], [place], [address] Phone: [phone]   Fax:     Modified Barium Swallow  Patient Details  Name: Julia Alvarado MRN: [id number] Date of Birth: 1941-06-26 No Data Recorded  Encounter Date: 02/29/2016      End of Session - 02/29/16 1306    Visit Number 1   Number of Visits 1   Date for SLP Re-Evaluation 02/29/16   SLP Start Time 1207   SLP Stop Time  1250   SLP Time Calculation (min) 43 min   Activity Tolerance Patient tolerated treatment well      Past Medical History  Diagnosis Date  . GERD (gastroesophageal reflux disease)   . Hypothyroidism   . Sacral fracture 1/15  . Lewy body dementia without behavioral disturbance   . Breast cancer   . Breast cancer     Past Surgical History  Procedure Laterality Date  . Mastectomy Right ~2005  . Retinal detachment repair w/ scleral buckle le    . Abdominal hysterectomy    . Cataract extraction w/ intraocular lens implant Right   . Mastectomy      There were no vitals filed for this visit.  Visit Diagnosis: Oropharyngeal dysphagia  Dysphagia - Plan: DG OP Swallowing Func-Medicare/Speech Path, DG OP Swallowing Func-Medicare/Speech Path     Subjective: Patient behavior: (alertness, ability to follow instructions, etc.): confused but able to follow simple directions  Chief complaint: foods and medication sticking in her throat   Objective:  Radiological Procedure: A videoflouroscopic evaluation of oral-preparatory, reflex initiation, and pharyngeal phases of the swallow was performed; as well as a screening of the upper esophageal phase.  I. POSTURE: Upright in MBS chair  II. VIEW: Lateral  III. COMPENSATORY STRATEGIES: alternate liquids and solids  IV. BOLUSES ADMINISTERED:   Thin Liquid: 2 small sips, 2 larger sips   Puree: 2 teaspoon presentations   Mechanical Soft: 1/4 graham cracker in applesauce  V. RESULTS  OF EVALUATION: A. ORAL PREPARATORY PHASE: (The lips, tongue, and velum are observed for strength and coordination)       **Overall Severity Rating: Within normal limits  B. SWALLOW INITIATION/REFLEX: (The reflex is normal if "triggered" by the time the bolus reached the base of the tongue)  **Overall Severity Rating: Mild- triggers at the valleculae  C. PHARYNGEAL PHASE: (Pharyngeal function is normal if the bolus shows rapid, smooth, and continuous transit through the pharynx and there is no pharyngeal residue after the swallow)  **Overall Severity Rating: Mild- decreased pharyngeal pressure generation with mild vallecular residue (puree only)  D. LARYNGEAL PENETRATION: (Material entering into the laryngeal inlet/vestibule but not aspirated) None  E. ASPIRATION: None  F. ESOPHAGEAL PHASE: (Screening of the upper esophagus) no abnormalities seen within the viewable cervical esophagus  ASSESSMENT: 75 year old woman, with history of Lewy Body dementia, GERD, thyroidectomy, and complaints of foods/medicines sticking in her throat, is presenting with mild oropharyngeal dysphagia.  Oral control of the bolus including oral hold, rotary mastication, and anterior to posterior transfer are within normal limits. Timing of the pharyngeal swallow is delayed, triggering at the valleculae.  The patient demonstrates decreased effort of swallowing resulting in decreased pharyngeal pressure generation with pureed solids (resulting in vallecular residue).  The patient demonstrates increased effort of swallow with chewable solid and thin liquids with trace residue post swallow.  Other pharyngeal aspects of the swallow including hyolaryngeal excursion, epiglottic inversion, duration/amplitude of UES opening, and laryngeal vestibule closure  at the height of the swallow are within normal limits.  The patient is at reduced risk for aspiration.  SLP advised caregiver to encourage alternating liquids and solids while eating  and to give meds whole in applesauce with water afterwards.  PLAN/RECOMMENDATIONS:   A. Diet: Regular   B. Swallowing Precautions: Alternate liquids and solids, medication in applesauce with water chaser   C. Recommended consultation to N/A   D. Therapy recommendations N/A   E. Results and recommendations were discussed with patient's caregiver immediately following the study and final report routed to referring MD           G-Codes - Mar 06, 2016 1308    Swallow Discharge Status [phone]) At least 20 percent but less than 40 percent impaired, limited or restricted          Problem List Patient Active Problem List   Diagnosis Date Noted  . GERD (gastroesophageal reflux disease)   . Hypothyroidism   . Sacral fracture ([place])   . Lewy body dementia without behavioral disturbance   . Breast cancer ([place])    Leroy Sea, MS/CCC- SLP  Lou Miner March 06, 2016, 1:11 PM  [facility] [place], [place], [address] Phone: [phone]   Fax:     Name: Julia Alvarado MRN: [id number] Date of Birth: 05/15/1941

## 2016-06-13 NOTE — Discharge Instructions (Signed)

## 2016-06-15 ENCOUNTER — Encounter
Admission: RE | Disposition: A | Discharge status: Home / Self Care | Payer: Self-pay | Source: Ambulatory Visit | Attending: Ophthalmology

## 2016-06-15 ENCOUNTER — Ambulatory Visit: Payer: Medicare Other | Admitting: Anesthesiology

## 2016-06-15 ENCOUNTER — Ambulatory Visit
Admission: RE | Admit: 2016-06-15 | Discharge: 2016-06-15 | Disposition: A | Discharge status: Home / Self Care | Payer: Medicare Other | Source: Ambulatory Visit | Attending: Ophthalmology | Admitting: Ophthalmology

## 2016-06-15 DIAGNOSIS — H2511 Age-related nuclear cataract, right eye: Secondary | ICD-10-CM | POA: Insufficient documentation

## 2016-06-15 DIAGNOSIS — M1990 Unspecified osteoarthritis, unspecified site: Secondary | ICD-10-CM | POA: Insufficient documentation

## 2016-06-15 DIAGNOSIS — Z791 Long term (current) use of non-steroidal anti-inflammatories (NSAID): Secondary | ICD-10-CM | POA: Insufficient documentation

## 2016-06-15 DIAGNOSIS — E039 Hypothyroidism, unspecified: Secondary | ICD-10-CM | POA: Diagnosis not present

## 2016-06-15 DIAGNOSIS — Z7951 Long term (current) use of inhaled steroids: Secondary | ICD-10-CM | POA: Insufficient documentation

## 2016-06-15 DIAGNOSIS — Z79899 Other long term (current) drug therapy: Secondary | ICD-10-CM | POA: Insufficient documentation

## 2016-06-15 DIAGNOSIS — H5703 Miosis: Secondary | ICD-10-CM | POA: Insufficient documentation

## 2016-06-15 HISTORY — PX: CATARACT EXTRACTION W/PHACO: SHX586

## 2016-06-15 HISTORY — DX: Hyperlipidemia, unspecified: E78.5

## 2016-06-15 HISTORY — DX: Unspecified osteoarthritis, unspecified site: M19.90

## 2016-06-15 SURGERY — PHACOEMULSIFICATION, CATARACT, WITH IOL INSERTION
Anesthesia: Monitor Anesthesia Care | Site: Eye | Laterality: Right | Wound class: Clean

## 2016-06-15 MED ORDER — EPINEPHRINE HCL 1 MG/ML IJ SOLN
INTRAOCULAR | Status: DC | PRN
  Administered 2016-06-15: 56 mL via OPHTHALMIC

## 2016-06-15 MED ORDER — MIDAZOLAM HCL 2 MG/2ML IJ SOLN
INTRAMUSCULAR | Status: DC | PRN
  Administered 2016-06-15: .5 mg via INTRAVENOUS

## 2016-06-15 MED ORDER — FENTANYL CITRATE (PF) 100 MCG/2ML IJ SOLN
INTRAMUSCULAR | Status: DC | PRN
  Administered 2016-06-15: 25 ug via INTRAVENOUS

## 2016-06-15 MED ORDER — BALANCED SALT IO SOLN
INTRAOCULAR | Status: DC | PRN
  Administered 2016-06-15: .5 mL via OPHTHALMIC

## 2016-06-15 MED ORDER — POVIDONE-IODINE 5 % OP SOLN
1.0000 "application " | OPHTHALMIC | Status: DC | PRN
  Administered 2016-06-15: 1 via OPHTHALMIC

## 2016-06-15 MED ORDER — TIMOLOL MALEATE 0.5 % OP SOLN
OPHTHALMIC | Status: DC | PRN
  Administered 2016-06-15: 1 [drp] via OPHTHALMIC

## 2016-06-15 MED ORDER — CEFUROXIME OPHTHALMIC INJECTION 1 MG/0.1 ML
INJECTION | OPHTHALMIC | Status: DC | PRN
  Administered 2016-06-15: 0.1 mL via INTRACAMERAL

## 2016-06-15 MED ORDER — ARMC OPHTHALMIC DILATING GEL
1.0000 "application " | OPHTHALMIC | Status: DC | PRN
  Administered 2016-06-15 (×2): 1 via OPHTHALMIC

## 2016-06-15 MED ORDER — NA HYALUR & NA CHOND-NA HYALUR 0.4-0.35 ML IO KIT
PACK | INTRAOCULAR | Status: DC | PRN
  Administered 2016-06-15: 1 mL via INTRAOCULAR

## 2016-06-15 MED ORDER — TETRACAINE HCL 0.5 % OP SOLN
1.0000 [drp] | OPHTHALMIC | Status: DC | PRN
  Administered 2016-06-15: 1 [drp] via OPHTHALMIC

## 2016-06-15 MED ORDER — BRIMONIDINE TARTRATE 0.2 % OP SOLN
OPHTHALMIC | Status: DC | PRN
  Administered 2016-06-15: 1 [drp] via OPHTHALMIC

## 2016-06-15 SURGICAL SUPPLY — 25 items
CANNULA ANT/CHMB 27GA (MISCELLANEOUS) ×3 IMPLANT
CARTRIDGE ABBOTT (MISCELLANEOUS) IMPLANT
GLOVE SURG LX 7.5 STRW (GLOVE) ×2
GLOVE SURG LX STRL 7.5 STRW (GLOVE) ×1 IMPLANT
GLOVE SURG TRIUMPH 8.0 PF LTX (GLOVE) ×3 IMPLANT
GOWN STRL REUS W/ TWL LRG LVL3 (GOWN DISPOSABLE) ×2 IMPLANT
GOWN STRL REUS W/TWL LRG LVL3 (GOWN DISPOSABLE) ×4
LENS IOL ACRYSOF IQ 22.5 (Intraocular Lens) ×3 IMPLANT
MARKER SKIN DUAL TIP RULER LAB (MISCELLANEOUS) ×3 IMPLANT
NDL RETROBULBAR .5 NSTRL (NEEDLE) IMPLANT
NEEDLE FILTER BLUNT 18X 1/2SAF (NEEDLE) ×4
NEEDLE FILTER BLUNT 18X1 1/2 (NEEDLE) ×2 IMPLANT
PACK CATARACT BRASINGTON (MISCELLANEOUS) ×3 IMPLANT
PACK EYE AFTER SURG (MISCELLANEOUS) ×3 IMPLANT
PACK OPTHALMIC (MISCELLANEOUS) ×3 IMPLANT
RING MALYGIN 7.0 (MISCELLANEOUS) ×3 IMPLANT
SUT ETHILON 10-0 CS-B-6CS-B-6 (SUTURE)
SUT VICRYL  9 0 (SUTURE)
SUT VICRYL 9 0 (SUTURE) IMPLANT
SUTURE EHLN 10-0 CS-B-6CS-B-6 (SUTURE) IMPLANT
SYR 3ML LL SCALE MARK (SYRINGE) ×6 IMPLANT
SYR 5ML LL (SYRINGE) ×3 IMPLANT
SYR TB 1ML LUER SLIP (SYRINGE) ×3 IMPLANT
WATER STERILE IRR 250ML POUR (IV SOLUTION) ×3 IMPLANT
WIPE NON LINTING 3.25X3.25 (MISCELLANEOUS) ×3 IMPLANT

## 2016-06-15 NOTE — Op Note (Signed)
OPERATIVE NOTE  Julia Alvarado [id number] 06/15/2016   PREOPERATIVE DIAGNOSIS:    Nuclear Sclerotic Cataract Right eye with miotic pupil.        H25.11  POSTOPERATIVE DIAGNOSIS: Nuclear Sclerotic Cataract Right eye with miotic pupil.          PROCEDURE:  Phacoemusification with posterior chamber intraocular lens placement of the right eye which required pupil stretching with the Malyugin pupil expansion device.  LENS:   Implant Name Type Inv. Item Serial No. Manufacturer Lot No. LRB No. Used  LENS IOL ACRYSOF IQ 22.5 - [id number] Intraocular Lens LENS IOL ACRYSOF IQ 22.5 [id number] ALCON   Right 1       ULTRASOUND TIME: 18.5 % of 1 minutes 7 seconds, CDE 12.4  SURGEON:  Wyonia Hough, MD   ANESTHESIA:  Topical with tetracaine drops and 2% Xylocaine jelly, augmented with 1% preservative-free intracameral lidocaine.   COMPLICATIONS:  None.   DESCRIPTION OF PROCEDURE:  The patient was identified in the holding room and transported to the operating room and placed in the supine position under the operating microscope. Theright eye was identified as the operative eye and it was prepped and draped in the usual sterile ophthalmic fashion.   A 1 millimeter clear-corneal paracentesis was made at the 12:00 position.  0.5 ml of preservative-free 1% lidocaine was injected into the anterior chamber. The anterior chamber was filled with Viscoat viscoelastic.  A 2.4 millimeter keratome was used to make a near-clear corneal incision at the 9:00 position. A Malyugin pupil expander was then placed through the main incision and into the anterior chamber of the eye.  The edge of the iris was secured on the lip of the pupil expander and it was released, thereby expanding the pupil to approximately 7 millimeters for completion of the cataract surgery.  Additional Viscoat was placed in the anterior chamber.  A cystotome and capsulorrhexis forceps were used to make a curvilinear capsulorrhexis.    Balanced salt solution was used to hydrodissect and hydrodelineate the lens nucleus.   Phacoemulsification was used in stop and chop fashion to remove the lens, nucleus and epinucleus.  The remaining cortex was aspirated using the irrigation aspiration handpiece.  Additional Provisc was placed into the eye to distend the capsular bag for lens placement.  A lens was then injected into the capsular bag.  The pupil expanding ring was removed using a Kuglen hook and insertion device. The remaining viscoelastic was aspirated from the capsular bag and the anterior chamber.  The anterior chamber was filled with balanced salt solution to inflate to a physiologic pressure.  Wounds were hydrated with balanced salt solution.  The anterior chamber was inflated to a physiologic pressure with balanced salt solution.  No wound leaks were noted.Cefuroxime 0.1 ml of a 10mg /ml solution was injected into the anterior chamber for a dose of 1 mg of intracameral antibiotic at the completion of the case. Timolol and Brimonidine drops were applied to the eye.  The patient was taken to the recovery room in stable condition without complications of anesthesia or surgery.  Charlee Whitebread 06/15/2016, 10:04 AM

## 2016-06-15 NOTE — H&P (Signed)
  The History and Physical notes are on paper, have been signed, and are to be scanned. The patient remains stable and unchanged from the H&P.   Previous H&P reviewed, patient examined, and there are no changes.  Anarie Kalish 06/15/2016 9:14 AM

## 2016-06-15 NOTE — Transfer of Care (Signed)
Immediate Anesthesia Transfer of Care Note  Patient: Julia Alvarado  Procedure(s) Performed: Procedure(s): CATARACT EXTRACTION PHACO AND INTRAOCULAR LENS PLACEMENT (IOC) (Right)  Patient Location: PACU  Anesthesia Type: MAC  Level of Consciousness: awake, alert  and patient cooperative  Airway and Oxygen Therapy: Patient Spontanous Breathing and Patient connected to supplemental oxygen  Post-op Assessment: Post-op Vital signs reviewed, Patient's Cardiovascular Status Stable, Respiratory Function Stable, Patent Airway and No signs of Nausea or vomiting  Post-op Vital Signs: Reviewed and stable  Complications: No apparent anesthesia complications

## 2016-06-15 NOTE — Anesthesia Postprocedure Evaluation (Signed)
Anesthesia Post Note  Patient: Julia Alvarado  Procedure(s) Performed: Procedure(s) (LRB): CATARACT EXTRACTION PHACO AND INTRAOCULAR LENS PLACEMENT (IOC) (Right)  Patient location during evaluation: PACU Anesthesia Type: MAC Level of consciousness: awake and alert and oriented Pain management: satisfactory to patient Vital Signs Assessment: post-procedure vital signs reviewed and stable Respiratory status: spontaneous breathing, nonlabored ventilation and respiratory function stable Cardiovascular status: blood pressure returned to baseline and stable Postop Assessment: Adequate PO intake and No signs of nausea or vomiting Anesthetic complications: no    Raliegh Ip

## 2016-06-15 NOTE — Anesthesia Preprocedure Evaluation (Signed)
Anesthesia Evaluation  Patient identified by MRN, date of birth, ID band  Reviewed: Allergy & Precautions, H&P , NPO status , Patient's Chart, lab work & pertinent test results  Airway Mallampati: III  TM Distance: >3 FB Neck ROM: full    Dental  (+) Edentulous Upper, Edentulous Lower   Pulmonary    Pulmonary exam normal        Cardiovascular  Rhythm:regular Rate:Normal     Neuro/Psych PSYCHIATRIC DISORDERS    GI/Hepatic GERD  ,  Endo/Other  Hypothyroidism   Renal/GU      Musculoskeletal   Abdominal   Peds  Hematology   Anesthesia Other Findings   Reproductive/Obstetrics                             Anesthesia Physical Anesthesia Plan  ASA: II  Anesthesia Plan: MAC   Post-op Pain Management:    Induction:   Airway Management Planned:   Additional Equipment:   Intra-op Plan:   Post-operative Plan:   Informed Consent: I have reviewed the patients History and Physical, chart, labs and discussed the procedure including the risks, benefits and alternatives for the proposed anesthesia with the patient or authorized representative who has indicated his/her understanding and acceptance.     Plan Discussed with: CRNA  Anesthesia Plan Comments:         Anesthesia Quick Evaluation

## 2016-06-16 ENCOUNTER — Encounter: Payer: Self-pay | Admitting: Ophthalmology

## 2016-06-24 NOTE — Telephone Encounter (Signed)
error 

## 2016-11-24 ENCOUNTER — Encounter: Payer: Self-pay | Admitting: *Deleted

## 2016-11-24 ENCOUNTER — Emergency Department: Payer: Medicare Other

## 2016-11-24 ENCOUNTER — Emergency Department
Admission: EM | Admit: 2016-11-24 | Discharge: 2016-11-24 | Disposition: A | Discharge status: Home / Self Care | Payer: Medicare Other | Attending: Emergency Medicine | Admitting: Emergency Medicine

## 2016-11-24 DIAGNOSIS — Z853 Personal history of malignant neoplasm of breast: Secondary | ICD-10-CM | POA: Insufficient documentation

## 2016-11-24 DIAGNOSIS — I10 Essential (primary) hypertension: Secondary | ICD-10-CM | POA: Diagnosis not present

## 2016-11-24 DIAGNOSIS — E039 Hypothyroidism, unspecified: Secondary | ICD-10-CM | POA: Diagnosis not present

## 2016-11-24 DIAGNOSIS — Z79899 Other long term (current) drug therapy: Secondary | ICD-10-CM | POA: Insufficient documentation

## 2016-11-24 DIAGNOSIS — R1030 Lower abdominal pain, unspecified: Secondary | ICD-10-CM | POA: Diagnosis present

## 2016-11-24 DIAGNOSIS — N390 Urinary tract infection, site not specified: Secondary | ICD-10-CM | POA: Insufficient documentation

## 2016-11-24 DIAGNOSIS — Z791 Long term (current) use of non-steroidal anti-inflammatories (NSAID): Secondary | ICD-10-CM | POA: Insufficient documentation

## 2016-11-24 HISTORY — DX: Essential (primary) hypertension: I10

## 2016-11-24 LAB — LIPASE, BLOOD: LIPASE: 24 U/L (ref 11–51)

## 2016-11-24 LAB — URINALYSIS, COMPLETE (UACMP) WITH MICROSCOPIC
Bacteria, UA: NONE SEEN
Bilirubin Urine: NEGATIVE
Glucose, UA: NEGATIVE mg/dL
Hgb urine dipstick: NEGATIVE
Ketones, ur: 5 mg/dL — AB
Nitrite: NEGATIVE
PH: 5 (ref 5.0–8.0)
Protein, ur: 30 mg/dL — AB
SPECIFIC GRAVITY, URINE: 1.019 (ref 1.005–1.030)

## 2016-11-24 LAB — COMPREHENSIVE METABOLIC PANEL
ALT: 21 U/L (ref 14–54)
AST: 35 U/L (ref 15–41)
Albumin: 4.3 g/dL (ref 3.5–5.0)
Alkaline Phosphatase: 64 U/L (ref 38–126)
Anion gap: 9 (ref 5–15)
BUN: 8 mg/dL (ref 6–20)
CO2: 25 mmol/L (ref 22–32)
CREATININE: 0.81 mg/dL (ref 0.44–1.00)
Calcium: 9.2 mg/dL (ref 8.9–10.3)
Chloride: 97 mmol/L — ABNORMAL LOW (ref 101–111)
GFR calc Af Amer: >60 mL/min (ref >60)
GLUCOSE: 117 mg/dL — AB (ref 65–99)
Potassium: 3.7 mmol/L (ref 3.5–5.1)
Sodium: 131 mmol/L — ABNORMAL LOW (ref 135–145)
Total Bilirubin: 0.2 mg/dL — ABNORMAL LOW (ref 0.3–1.2)
Total Protein: 7.5 g/dL (ref 6.5–8.1)

## 2016-11-24 LAB — CBC
HCT: 37.5 % (ref 35.0–47.0)
Hemoglobin: 12.9 g/dL (ref 12.0–16.0)
MCH: 31.8 pg (ref 26.0–34.0)
MCHC: 34.4 g/dL (ref 32.0–36.0)
MCV: 92.4 fL (ref 80.0–100.0)
PLATELETS: 315 10*3/uL (ref 150–440)
RBC: 4.06 MIL/uL (ref 3.80–5.20)
RDW: 14.4 % (ref 11.5–14.5)
WBC: 6.5 10*3/uL (ref 3.6–11.0)

## 2016-11-24 MED ORDER — IOPAMIDOL (ISOVUE-300) INJECTION 61%
100.0000 mL | Freq: Once | INTRAVENOUS | Status: AC | PRN
  Administered 2016-11-24: 100 mL via INTRAVENOUS

## 2016-11-24 MED ORDER — DEXTROSE 5 % IV SOLN: 1.0000 g | Freq: Once | INTRAVENOUS | Status: DC

## 2016-11-24 MED ORDER — IOPAMIDOL (ISOVUE-300) INJECTION 61%
30.0000 mL | Freq: Once | INTRAVENOUS | Status: AC
  Administered 2016-11-24: 30 mL via ORAL

## 2016-11-24 MED ORDER — CEPHALEXIN 500 MG PO CAPS: 500.0000 mg | ORAL_CAPSULE | Freq: Three times a day (TID) | ORAL | 0 refills | Status: AC

## 2016-11-24 MED ORDER — CEFTRIAXONE SODIUM-DEXTROSE 1-3.74 GM-% IV SOLR
1.0000 g | INTRAVENOUS | Status: DC
  Administered 2016-11-24: 1 g via INTRAVENOUS
  Filled 2016-11-24: qty 50

## 2016-11-24 NOTE — ED Triage Notes (Signed)
Pt lives at [facility], owner explains pt has had a UTI intermittent for the last 4-months, pt complains of lower abdominal pain with dysuria

## 2016-11-24 NOTE — ED Provider Notes (Signed)
[facility] Emergency Department Provider Note  ____________________________________________   I have reviewed the triage vital signs and the nursing notes.   HISTORY  Chief Complaint Abdominal Pain and Dysuria    HPI Julia Alvarado is a 75 y.o. female with a history of chronic recurrent urinary tract infections is been off and on antibiotics for the last 2 months according to patient. Patient and caretakers state that she still has urinary tract symptoms. She was seen by her doctor because she started to have lower abdominal pain. Her doctor that she likely had diverticulitis, started her on Cipro Flagyl. This was 2 days ago. Patient continues with dysuria, feels generally weak, and has lower abdominal discomfort. Denies fever or vomiting.      Past Medical History:  Diagnosis Date  . Arthritis   . Breast cancer ([place])   . Breast cancer ([place])   . GERD (gastroesophageal reflux disease)   . Hyperlipemia   . Hypertension   . Hypothyroidism   . Lewy body dementia without behavioral disturbance   . Sacral fracture ([place]) 1/15    Patient Active Problem List   Diagnosis Date Noted  . GERD (gastroesophageal reflux disease)   . Hypothyroidism   . Sacral fracture ([place])   . Lewy body dementia without behavioral disturbance   . Breast cancer [facility])     Past Surgical History:  Procedure Laterality Date  . ABDOMINAL HYSTERECTOMY    . CATARACT EXTRACTION W/ INTRAOCULAR LENS IMPLANT Right   . CATARACT EXTRACTION W/PHACO Right 06/15/2016   Procedure: CATARACT EXTRACTION PHACO AND INTRAOCULAR LENS PLACEMENT (IOC);  Surgeon: Leandrew Koyanagi, MD;  Location: [facility];  Service: Ophthalmology;  Laterality: Right;  . MASTECTOMY Right ~2005  . MASTECTOMY    . RETINAL DETACHMENT REPAIR W/ SCLERAL BUCKLE LE      Prior to Admission medications   Medication Sig Start Date End Date Taking? Authorizing Provider  citalopram (CELEXA) 20 MG tablet Take 20  mg by mouth daily.    Historical Provider, MD  donepezil (ARICEPT) 5 MG tablet Take 5 mg by mouth at bedtime.    Historical Provider, MD  dorzolamide-timolol (COSOPT) 22.3-6.8 MG/ML ophthalmic solution Place 1 drop into the right eye 2 (two) times daily.    Historical Provider, MD  fluticasone (FLONASE) 50 MCG/ACT nasal spray Place into both nostrils daily.    Historical Provider, MD  gabapentin (NEURONTIN) 100 MG capsule Take 100 mg by mouth 3 (three) times daily. Reported on 06/09/2016    Historical Provider, MD  glucosamine-chondroitin (GLUCOSAMINE-CHONDROITIN DS) 500-400 MG tablet Take 1 tablet by mouth 3 (three) times daily. Reported on 06/09/2016    Historical Provider, MD  GuaiFENesin (COUGH SYRUP PO) Take 10 mLs by mouth every 6 (six) hours as needed. Reported on 06/09/2016    Historical Provider, MD  hydroxypropyl methylcellulose / hypromellose (ISOPTO TEARS / GONIOVISC) 2.5 % ophthalmic solution 1 drop.    Historical Provider, MD  ibuprofen (ADVIL,MOTRIN) 400 MG tablet Take 400 mg by mouth 3 (three) times daily. Reported on 06/09/2016    Historical Provider, MD  ibuprofen (ADVIL,MOTRIN) 800 MG tablet Take 800 mg by mouth every 8 (eight) hours as needed for mild pain. Reported on 06/09/2016    Historical Provider, MD  latanoprost (XALATAN) 0.005 % ophthalmic solution Place 1 drop into both eyes at bedtime. Reported on 06/09/2016    Historical Provider, MD  levothyroxine (SYNTHROID, LEVOTHROID) 100 MCG tablet Take 100 mcg by mouth daily before breakfast. Reported on 06/09/2016  Historical Provider, MD  lisinopril (PRINIVIL,ZESTRIL) 5 MG tablet Take 5 mg by mouth daily. Reported on 06/09/2016    Historical Provider, MD  loratadine (CLARITIN) 10 MG tablet Take 10 mg by mouth daily.    Historical Provider, MD  LORazepam (ATIVAN) 0.5 MG tablet Take 0.5 mg by mouth every 8 (eight) hours as needed for anxiety. Reported on 06/09/2016    Historical Provider, MD  magnesium sulfate (EPSOM SALTS) crystals Apply  1 application topically 2 (two) times daily. Reported on 06/09/2016    Historical Provider, MD  Melatonin 3 MG CAPS Take 1 capsule by mouth at bedtime. Reported on 06/09/2016    Historical Provider, MD  memantine (NAMENDA) 5 MG tablet Take 5 mg by mouth 2 (two) times daily.    Historical Provider, MD  omega-3 acid ethyl esters (LOVAZA) 1 g capsule Take by mouth 2 (two) times daily.    Historical Provider, MD  Omega-3 Fatty Acids (FISH OIL) 1000 MG CAPS Take 1 capsule by mouth daily. Reported on 06/09/2016    Historical Provider, MD  risperiDONE (RISPERDAL) 0.25 MG tablet Take 0.25 mg by mouth 2 (two) times daily.    Historical Provider, MD  rivastigmine (EXELON) 3 MG capsule Take 1 capsule (3 mg total) by mouth 2 (two) times daily. Patient not taking: Reported on 06/09/2016 11/04/14   Venia Carbon, MD  sodium chloride (OCEAN) 0.65 % SOLN nasal spray Place 1 spray into both nostrils 5 (five) times daily. Reported on 06/09/2016    Historical Provider, MD  traMADol (ULTRAM) 50 MG tablet Take 50 mg by mouth every 6 (six) hours as needed. Reported on 06/09/2016    Historical Provider, MD  traZODone (DESYREL) 50 MG tablet Take 50 mg by mouth at bedtime.    Historical Provider, MD  vitamin B-12 (CYANOCOBALAMIN) 1000 MCG tablet Take 1,000 mcg by mouth daily.    Historical Provider, MD    Allergies Cefdinir; Codeine; Ibandronic acid; Latex; Nitrofurantoin; and Raloxifene  Family History  Problem Relation Age of Onset  . Heart disease Father   . Cancer Brother     all except 1 died of cancer    Social History Social History  Substance Use Topics  . Smoking status: Never Smoker  . Smokeless tobacco: Never Used  . Alcohol use No    Review of Systems Constitutional: No fever/chills Eyes: No visual changes. ENT: No sore throat. No stiff neck no neck pain Cardiovascular: Denies chest pain. Respiratory: Denies shortness of breath. Gastrointestinal:   no vomiting.  No diarrhea.  No  constipation. Genitourinary: Positive for dysuria. Musculoskeletal: Negative lower extremity swelling Skin: Negative for rash. Neurological: Negative for severe headaches, focal weakness or numbness. 10-point ROS otherwise negative.  ____________________________________________   PHYSICAL EXAM:  VITAL SIGNS: ED Triage Vitals  Enc Vitals Group     BP 11/24/16 1258 (!) 148/88     Pulse Rate 11/24/16 1258 89     Resp 11/24/16 1258 20     Temp 11/24/16 1258 98 F (36.7 C)     Temp Source 11/24/16 1258 Oral     SpO2 11/24/16 1258 98 %     Weight 11/24/16 1259 125 lb (56.7 kg)     Height 11/24/16 1259 5\' 2"  (1.575 m)     Head Circumference --      Peak Flow --      Pain Score --      Pain Loc --      Pain Edu? --  Excl. in [place]? --     Constitutional: Alert and oriented. Well appearing and in no acute distress. Eyes: Conjunctivae are normal. PERRL. EOMI. Head: Atraumatic. Nose: No congestion/rhinnorhea. Mouth/Throat: Mucous membranes are moist.  Oropharynx non-erythematous. Neck: No stridor.   Nontender with no meningismus Cardiovascular: Normal rate, regular rhythm. Grossly normal heart sounds.  Good peripheral circulation. Respiratory: Normal respiratory effort.  No retractions. Lungs CTAB. Abdominal: Soft and Lower abdominal tenderness noted. No distention. No guarding no rebound Back:  There is no focal tenderness or step off.  there is no midline tenderness there are no lesions noted. there is no CVA tenderness Musculoskeletal: No lower extremity tenderness, no upper extremity tenderness. No joint effusions, no DVT signs strong distal pulses no edema Neurologic:  Normal speech and language. No gross focal neurologic deficits are appreciated.  Skin:  Skin is warm, dry and intact. No rash noted. Psychiatric: Mood and affect are normal. Speech and behavior are normal.  ____________________________________________   LABS (all labs ordered are listed, but only abnormal  results are displayed)  Labs Reviewed  COMPREHENSIVE METABOLIC PANEL - Abnormal; Notable for the following:       Result Value   Sodium 131 (*)    Chloride 97 (*)    Glucose, Bld 117 (*)    Total Bilirubin 0.2 (*)    All other components within normal limits  URINALYSIS, COMPLETE (UACMP) WITH MICROSCOPIC - Abnormal; Notable for the following:    Color, Urine YELLOW (*)    APPearance CLEAR (*)    Ketones, ur 5 (*)    Protein, ur 30 (*)    Leukocytes, UA LARGE (*)    Squamous Epithelial / LPF 0-5 (*)    All other components within normal limits  URINE CULTURE  LIPASE, BLOOD  CBC   ____________________________________________  EKG  I personally interpreted any EKGs ordered by me or triage  ____________________________________________  RADIOLOGY  I reviewed any imaging ordered by me or triage that were performed during my shift and, if possible, patient and/or family made aware of any abnormal findings. ____________________________________________   PROCEDURES  Procedure(s) performed: None  Procedures  Critical Care performed: None  ____________________________________________   INITIAL IMPRESSION / ASSESSMENT AND PLAN / ED COURSE  Pertinent labs & imaging results that were available during my care of the patient were reviewed by me and considered in my medical decision making (see chart for details).  Patient has persistent urinary tract infection. No evidence of diverticulitis fortunately. She is on antibiotics at this time but not getting better and feeling weaker. Review of form or urine cultures suggested this is because of extensive resistance. Patient has allergy to Ceftin however she does not know what it is. Notes suggest that she has a oral thrush with cefdinir. This is not a true allergy. Patient states she has never swelled up.   The bacterium is resistant to Bactrim, also to Cipro it is sensitive to ceftriaxone but she has a cefdinir allergy which is  apparently oral thrush. If that is the only allergy, that should be okay to give her Rocephin and send her home on Keflex, however, the patient does not recall what her allergy is.. It is however sensitive to Zosyn but not oral forms of penicillin. Also appears to be sensitive to nitrofurantoin, however patient cannot tolerate that medication because it causes her to vomit and in any event is a cheer static medication. I think at this point given that there is no true allergy to  cephalosporins, we will give her a shot of Rocephin and see if we can safely get her home on Keflex if she tolerates that.  Clinical Course    ____________________________________________   FINAL CLINICAL IMPRESSION(S) / ED DIAGNOSES  Final diagnoses:  None      This chart was dictated using voice recognition software.  Despite best efforts to proofread,  errors can occur which can change meaning.      Schuyler Amor, MD 11/24/16 [phone]

## 2016-11-24 NOTE — Discharge Instructions (Signed)
Stop taking the Cipro and the Flagyl, started taking the Keflex return to the emergency room for any new or worrisome symptoms include fever, increased pain, weakness or you feel worse in any way.

## 2016-11-24 NOTE — ED Notes (Signed)
Patient transported to CT 

## 2016-11-24 NOTE — ED Notes (Signed)
Reviewed d/c instructions, follow-up care, and prescription with pt and caregiver. Pt and caregiver verbalized understanding

## 2016-11-25 LAB — URINE CULTURE: Culture: NO GROWTH

## 2016-12-16 ENCOUNTER — Inpatient Hospital Stay
Admission: EM | Admit: 2016-12-16 | Discharge: 2016-12-18 | DRG: 644 | Disposition: A | Discharge status: Home / Self Care | Payer: Medicare Other | Attending: Internal Medicine | Admitting: Internal Medicine

## 2016-12-16 ENCOUNTER — Emergency Department: Payer: Medicare Other

## 2016-12-16 DIAGNOSIS — E890 Postprocedural hypothyroidism: Secondary | ICD-10-CM

## 2016-12-16 DIAGNOSIS — K219 Gastro-esophageal reflux disease without esophagitis: Secondary | ICD-10-CM | POA: Diagnosis present

## 2016-12-16 DIAGNOSIS — E039 Hypothyroidism, unspecified: Secondary | ICD-10-CM | POA: Diagnosis present

## 2016-12-16 DIAGNOSIS — E785 Hyperlipidemia, unspecified: Secondary | ICD-10-CM | POA: Diagnosis present

## 2016-12-16 DIAGNOSIS — Z91040 Latex allergy status: Secondary | ICD-10-CM

## 2016-12-16 DIAGNOSIS — Z809 Family history of malignant neoplasm, unspecified: Secondary | ICD-10-CM

## 2016-12-16 DIAGNOSIS — R262 Difficulty in walking, not elsewhere classified: Secondary | ICD-10-CM

## 2016-12-16 DIAGNOSIS — Z888 Allergy status to other drugs, medicaments and biological substances status: Secondary | ICD-10-CM

## 2016-12-16 DIAGNOSIS — E871 Hypo-osmolality and hyponatremia: Secondary | ICD-10-CM

## 2016-12-16 DIAGNOSIS — Z79899 Other long term (current) drug therapy: Secondary | ICD-10-CM

## 2016-12-16 DIAGNOSIS — R531 Weakness: Secondary | ICD-10-CM

## 2016-12-16 DIAGNOSIS — I10 Essential (primary) hypertension: Secondary | ICD-10-CM | POA: Diagnosis present

## 2016-12-16 DIAGNOSIS — F329 Major depressive disorder, single episode, unspecified: Secondary | ICD-10-CM | POA: Diagnosis present

## 2016-12-16 DIAGNOSIS — Z961 Presence of intraocular lens: Secondary | ICD-10-CM | POA: Diagnosis present

## 2016-12-16 DIAGNOSIS — Z9011 Acquired absence of right breast and nipple: Secondary | ICD-10-CM

## 2016-12-16 DIAGNOSIS — Z885 Allergy status to narcotic agent status: Secondary | ICD-10-CM

## 2016-12-16 DIAGNOSIS — M6281 Muscle weakness (generalized): Secondary | ICD-10-CM

## 2016-12-16 DIAGNOSIS — Z8249 Family history of ischemic heart disease and other diseases of the circulatory system: Secondary | ICD-10-CM

## 2016-12-16 DIAGNOSIS — Z853 Personal history of malignant neoplasm of breast: Secondary | ICD-10-CM

## 2016-12-16 DIAGNOSIS — F0280 Dementia in other diseases classified elsewhere without behavioral disturbance: Secondary | ICD-10-CM | POA: Diagnosis present

## 2016-12-16 DIAGNOSIS — G3183 Dementia with Lewy bodies: Secondary | ICD-10-CM | POA: Diagnosis present

## 2016-12-16 LAB — COMPREHENSIVE METABOLIC PANEL WITH GFR
ALT: 14 U/L (ref 14–54)
AST: 19 U/L (ref 15–41)
Albumin: 4.1 g/dL (ref 3.5–5.0)
Alkaline Phosphatase: 50 U/L (ref 38–126)
Anion gap: 8 (ref 5–15)
BUN: 9 mg/dL (ref 6–20)
CO2: 26 mmol/L (ref 22–32)
Calcium: 9.2 mg/dL (ref 8.9–10.3)
Chloride: 93 mmol/L — ABNORMAL LOW (ref 101–111)
Creatinine, Ser: 0.68 mg/dL (ref 0.44–1.00)
GFR calc Af Amer: >60 mL/min
GFR calc non Af Amer: >60 mL/min
Glucose, Bld: 95 mg/dL (ref 65–99)
Potassium: 3.5 mmol/L (ref 3.5–5.1)
Sodium: 127 mmol/L — ABNORMAL LOW (ref 135–145)
Total Bilirubin: 0.8 mg/dL (ref 0.3–1.2)
Total Protein: 6.9 g/dL (ref 6.5–8.1)

## 2016-12-16 LAB — CBC
HCT: 33.4 % — ABNORMAL LOW (ref 35.0–47.0)
Hemoglobin: 11.5 g/dL — ABNORMAL LOW (ref 12.0–16.0)
MCH: 31.9 pg (ref 26.0–34.0)
MCHC: 34.5 g/dL (ref 32.0–36.0)
MCV: 92.7 fL (ref 80.0–100.0)
PLATELETS: 279 10*3/uL (ref 150–440)
RBC: 3.6 MIL/uL — AB (ref 3.80–5.20)
RDW: 15.5 % — AB (ref 11.5–14.5)
WBC: 5.1 10*3/uL (ref 3.6–11.0)

## 2016-12-16 NOTE — ED Notes (Signed)
ED Provider at bedside. 

## 2016-12-16 NOTE — ED Triage Notes (Signed)
Pt was treated for UTI over a week ago and was referred to urology for recurrent infection. Co pain all over, weakness, body aches, no fever.

## 2016-12-17 ENCOUNTER — Emergency Department: Payer: Medicare Other

## 2016-12-17 ENCOUNTER — Encounter: Payer: Self-pay | Admitting: *Deleted

## 2016-12-17 DIAGNOSIS — E871 Hypo-osmolality and hyponatremia: Secondary | ICD-10-CM | POA: Diagnosis present

## 2016-12-17 DIAGNOSIS — E890 Postprocedural hypothyroidism: Secondary | ICD-10-CM | POA: Diagnosis present

## 2016-12-17 DIAGNOSIS — Z79899 Other long term (current) drug therapy: Secondary | ICD-10-CM | POA: Diagnosis not present

## 2016-12-17 DIAGNOSIS — E785 Hyperlipidemia, unspecified: Secondary | ICD-10-CM | POA: Diagnosis present

## 2016-12-17 DIAGNOSIS — Z8249 Family history of ischemic heart disease and other diseases of the circulatory system: Secondary | ICD-10-CM | POA: Diagnosis not present

## 2016-12-17 DIAGNOSIS — K219 Gastro-esophageal reflux disease without esophagitis: Secondary | ICD-10-CM | POA: Diagnosis present

## 2016-12-17 DIAGNOSIS — Z888 Allergy status to other drugs, medicaments and biological substances status: Secondary | ICD-10-CM | POA: Diagnosis not present

## 2016-12-17 DIAGNOSIS — Z853 Personal history of malignant neoplasm of breast: Secondary | ICD-10-CM | POA: Diagnosis not present

## 2016-12-17 DIAGNOSIS — Z809 Family history of malignant neoplasm, unspecified: Secondary | ICD-10-CM | POA: Diagnosis not present

## 2016-12-17 DIAGNOSIS — I10 Essential (primary) hypertension: Secondary | ICD-10-CM | POA: Diagnosis present

## 2016-12-17 DIAGNOSIS — E039 Hypothyroidism, unspecified: Secondary | ICD-10-CM | POA: Diagnosis present

## 2016-12-17 DIAGNOSIS — Z9011 Acquired absence of right breast and nipple: Secondary | ICD-10-CM | POA: Diagnosis not present

## 2016-12-17 DIAGNOSIS — F0280 Dementia in other diseases classified elsewhere without behavioral disturbance: Secondary | ICD-10-CM | POA: Diagnosis present

## 2016-12-17 DIAGNOSIS — Z885 Allergy status to narcotic agent status: Secondary | ICD-10-CM | POA: Diagnosis not present

## 2016-12-17 DIAGNOSIS — G3183 Dementia with Lewy bodies: Secondary | ICD-10-CM | POA: Diagnosis present

## 2016-12-17 DIAGNOSIS — Z91040 Latex allergy status: Secondary | ICD-10-CM | POA: Diagnosis not present

## 2016-12-17 DIAGNOSIS — F329 Major depressive disorder, single episode, unspecified: Secondary | ICD-10-CM | POA: Diagnosis present

## 2016-12-17 DIAGNOSIS — Z961 Presence of intraocular lens: Secondary | ICD-10-CM | POA: Diagnosis present

## 2016-12-17 LAB — HEPATIC FUNCTION PANEL
ALBUMIN: 4 g/dL (ref 3.5–5.0)
ALT: 14 U/L (ref 14–54)
AST: 21 U/L (ref 15–41)
Alkaline Phosphatase: 48 U/L (ref 38–126)
BILIRUBIN TOTAL: 0.6 mg/dL (ref 0.3–1.2)
Bilirubin, Direct: <0.1 mg/dL — ABNORMAL LOW (ref 0.1–0.5)
TOTAL PROTEIN: 6.8 g/dL (ref 6.5–8.1)

## 2016-12-17 LAB — URINALYSIS, COMPLETE (UACMP) WITH MICROSCOPIC
BACTERIA UA: NONE SEEN
Bilirubin Urine: NEGATIVE
Glucose, UA: NEGATIVE mg/dL
Ketones, ur: NEGATIVE mg/dL
Leukocytes, UA: NEGATIVE
Nitrite: NEGATIVE
PROTEIN: NEGATIVE mg/dL
Specific Gravity, Urine: 1.005 (ref 1.005–1.030)
pH: 6 (ref 5.0–8.0)

## 2016-12-17 LAB — TROPONIN I

## 2016-12-17 LAB — LACTIC ACID, PLASMA: Lactic Acid, Venous: 0.8 mmol/L (ref 0.5–1.9)

## 2016-12-17 LAB — TSH: TSH: 47.074 u[IU]/mL — AB (ref 0.350–4.500)

## 2016-12-17 LAB — T4, FREE: Free T4: 0.92 ng/dL (ref 0.61–1.12)

## 2016-12-17 LAB — MRSA PCR SCREENING: MRSA by PCR: NEGATIVE

## 2016-12-17 MED ORDER — HYDRALAZINE HCL 20 MG/ML IJ SOLN
10.0000 mg | Freq: Once | INTRAMUSCULAR | Status: AC
  Administered 2016-12-17: 10 mg via INTRAVENOUS
  Filled 2016-12-17: qty 1

## 2016-12-17 MED ORDER — ACETAMINOPHEN 650 MG RE SUPP: 650.0000 mg | Freq: Four times a day (QID) | RECTAL | Status: DC | PRN

## 2016-12-17 MED ORDER — ENOXAPARIN SODIUM 40 MG/0.4ML ~~LOC~~ SOLN
40.0000 mg | SUBCUTANEOUS | Status: DC
  Administered 2016-12-17: 40 mg via SUBCUTANEOUS
  Filled 2016-12-17: qty 0.4

## 2016-12-17 MED ORDER — DONEPEZIL HCL 5 MG PO TABS
5.0000 mg | ORAL_TABLET | Freq: Every day | ORAL | Status: DC
  Administered 2016-12-17: 5 mg via ORAL
  Filled 2016-12-17: qty 1

## 2016-12-17 MED ORDER — LORATADINE 10 MG PO TABS
10.0000 mg | ORAL_TABLET | Freq: Every day | ORAL | Status: DC
  Administered 2016-12-17 – 2016-12-18 (×2): 10 mg via ORAL
  Filled 2016-12-17 (×2): qty 1

## 2016-12-17 MED ORDER — DOCUSATE SODIUM 100 MG PO CAPS
100.0000 mg | ORAL_CAPSULE | Freq: Two times a day (BID) | ORAL | Status: DC
  Administered 2016-12-17 – 2016-12-18 (×3): 100 mg via ORAL
  Filled 2016-12-17 (×3): qty 1

## 2016-12-17 MED ORDER — POLYVINYL ALCOHOL 1.4 % OP SOLN
1.0000 [drp] | OPHTHALMIC | Status: DC | PRN
  Filled 2016-12-17: qty 15

## 2016-12-17 MED ORDER — IBUPROFEN 400 MG PO TABS
400.0000 mg | ORAL_TABLET | Freq: Three times a day (TID) | ORAL | Status: DC
  Administered 2016-12-17 – 2016-12-18 (×4): 400 mg via ORAL
  Filled 2016-12-17 (×4): qty 1

## 2016-12-17 MED ORDER — SODIUM CHLORIDE 0.9% FLUSH
3.0000 mL | Freq: Two times a day (BID) | INTRAVENOUS | Status: DC
  Administered 2016-12-17 – 2016-12-18 (×3): 3 mL via INTRAVENOUS

## 2016-12-17 MED ORDER — HYDRALAZINE HCL 20 MG/ML IJ SOLN: 10.0000 mg | INTRAMUSCULAR | Status: DC | PRN

## 2016-12-17 MED ORDER — SALINE SPRAY 0.65 % NA SOLN
1.0000 | Freq: Every day | NASAL | Status: DC
  Administered 2016-12-17 – 2016-12-18 (×7): 1 via NASAL
  Filled 2016-12-17: qty 44

## 2016-12-17 MED ORDER — MELATONIN 5 MG PO TABS
2.5000 mg | ORAL_TABLET | Freq: Every day | ORAL | Status: DC
  Administered 2016-12-17: 2.5 mg via ORAL
  Filled 2016-12-17: qty 0.5

## 2016-12-17 MED ORDER — ONDANSETRON HCL 4 MG/2ML IJ SOLN: 4.0000 mg | Freq: Four times a day (QID) | INTRAMUSCULAR | Status: DC | PRN

## 2016-12-17 MED ORDER — GLUCOSAMINE-CHONDROITIN 500-400 MG PO TABS: 1.0000 | ORAL_TABLET | Freq: Three times a day (TID) | ORAL | Status: DC

## 2016-12-17 MED ORDER — LATANOPROST 0.005 % OP SOLN
1.0000 [drp] | Freq: Every day | OPHTHALMIC | Status: DC
  Administered 2016-12-17: 1 [drp] via OPHTHALMIC
  Filled 2016-12-17: qty 2.5

## 2016-12-17 MED ORDER — ONDANSETRON HCL 4 MG PO TABS: 4.0000 mg | ORAL_TABLET | Freq: Four times a day (QID) | ORAL | Status: DC | PRN

## 2016-12-17 MED ORDER — MELATONIN 3 MG PO CAPS: 1.0000 | ORAL_CAPSULE | Freq: Every day | ORAL | Status: DC

## 2016-12-17 MED ORDER — LEVOTHYROXINE SODIUM 50 MCG PO TABS
100.0000 ug | ORAL_TABLET | Freq: Every day | ORAL | Status: DC
  Administered 2016-12-17 – 2016-12-18 (×2): 100 ug via ORAL
  Filled 2016-12-17: qty 2
  Filled 2016-12-17 (×2): qty 1

## 2016-12-17 MED ORDER — VITAMIN B-12 1000 MCG PO TABS
1000.0000 ug | ORAL_TABLET | Freq: Every day | ORAL | Status: DC
  Administered 2016-12-17 – 2016-12-18 (×2): 1000 ug via ORAL
  Filled 2016-12-17 (×2): qty 1

## 2016-12-17 MED ORDER — MEMANTINE HCL 5 MG PO TABS
5.0000 mg | ORAL_TABLET | Freq: Two times a day (BID) | ORAL | Status: DC
  Administered 2016-12-17 – 2016-12-18 (×3): 5 mg via ORAL
  Filled 2016-12-17 (×3): qty 1

## 2016-12-17 MED ORDER — LEVOTHYROXINE SODIUM 100 MCG IV SOLR
25.0000 ug | Freq: Once | INTRAVENOUS | Status: AC
  Administered 2016-12-17: 25 ug via INTRAVENOUS
  Filled 2016-12-17: qty 5

## 2016-12-17 MED ORDER — TRAZODONE HCL 50 MG PO TABS
25.0000 mg | ORAL_TABLET | Freq: Every evening | ORAL | Status: DC | PRN
  Administered 2016-12-17 (×2): 25 mg via ORAL
  Filled 2016-12-17 (×2): qty 1

## 2016-12-17 MED ORDER — OMEGA-3-ACID ETHYL ESTERS 1 G PO CAPS
1.0000 g | ORAL_CAPSULE | Freq: Every day | ORAL | Status: DC
  Administered 2016-12-17 – 2016-12-18 (×2): 1 g via ORAL
  Filled 2016-12-17 (×2): qty 1

## 2016-12-17 MED ORDER — DORZOLAMIDE HCL-TIMOLOL MAL 2-0.5 % OP SOLN
1.0000 [drp] | Freq: Two times a day (BID) | OPHTHALMIC | Status: DC
  Administered 2016-12-17 – 2016-12-18 (×3): 1 [drp] via OPHTHALMIC
  Filled 2016-12-17: qty 10

## 2016-12-17 MED ORDER — HYPROMELLOSE (GONIOSCOPIC) 2.5 % OP SOLN: 1.0000 [drp] | OPHTHALMIC | Status: DC | PRN

## 2016-12-17 MED ORDER — CITALOPRAM HYDROBROMIDE 20 MG PO TABS
20.0000 mg | ORAL_TABLET | Freq: Every day | ORAL | Status: DC
  Administered 2016-12-17 – 2016-12-18 (×2): 20 mg via ORAL
  Filled 2016-12-17 (×2): qty 1

## 2016-12-17 MED ORDER — FLUTICASONE PROPIONATE 50 MCG/ACT NA SUSP
1.0000 | Freq: Every day | NASAL | Status: DC
  Administered 2016-12-17 – 2016-12-18 (×2): 1 via NASAL
  Filled 2016-12-17: qty 16

## 2016-12-17 MED ORDER — ACETAMINOPHEN 325 MG PO TABS
650.0000 mg | ORAL_TABLET | Freq: Four times a day (QID) | ORAL | Status: DC | PRN
  Administered 2016-12-17: 650 mg via ORAL
  Filled 2016-12-17: qty 2

## 2016-12-17 MED ORDER — RISPERIDONE 1 MG PO TABS: 0.5000 mg | ORAL_TABLET | Freq: Two times a day (BID) | ORAL | Status: DC | PRN

## 2016-12-17 NOTE — ED Provider Notes (Signed)
[facility] Emergency Department Provider Note   ____________________________________________   First MD Initiated Contact with Patient 12/16/16 2328     (approximate)  I have reviewed the triage vital signs and the nursing notes.   HISTORY  Chief Complaint Weakness    HPI Julia Alvarado is a 75 y.o. female who comes into the hospital today with weakness. According to the patient's caregivers she has had a UTI for the past few months. She has been steadily getting weaker and weaker. She was here about 2-3 weeks ago and found to have a UTI. The patient has not had a clear up of her symptoms. She has seen her primary care physician and was told that she still had a UTI. The patient has been referred to urology. She has an appointment on January 19 but has been more disoriented with some increased weakness and decreased appetite over the last 2 months. The patient may have a good day that is worse. She has been on Cipro, Keflex, fosfomycin but has not gotten any better. The patient has not had any fevers but has had some nausea with diarrhea dizziness and leg pain. The patient was brought in for further evaluation of these symptoms.   Past Medical History:  Diagnosis Date  . Arthritis   . Breast cancer ([place])   . Breast cancer ([place])   . GERD (gastroesophageal reflux disease)   . Hyperlipemia   . Hypertension   . Hypothyroidism   . Lewy body dementia without behavioral disturbance   . Sacral fracture ([place]) 1/15    Patient Active Problem List   Diagnosis Date Noted  . GERD (gastroesophageal reflux disease)   . Hypothyroidism   . Sacral fracture ([place])   . Lewy body dementia without behavioral disturbance   . Breast cancer [facility])     Past Surgical History:  Procedure Laterality Date  . ABDOMINAL HYSTERECTOMY    . CATARACT EXTRACTION W/ INTRAOCULAR LENS IMPLANT Right   . CATARACT EXTRACTION W/PHACO Right 06/15/2016   Procedure: CATARACT EXTRACTION  PHACO AND INTRAOCULAR LENS PLACEMENT (IOC);  Surgeon: Leandrew Koyanagi, MD;  Location: [place];  Service: Ophthalmology;  Laterality: Right;  . MASTECTOMY Right ~2005  . MASTECTOMY    . RETINAL DETACHMENT REPAIR W/ SCLERAL BUCKLE LE      Prior to Admission medications   Medication Sig Start Date End Date Taking? Authorizing Provider  citalopram (CELEXA) 20 MG tablet Take 20 mg by mouth daily.   Yes Historical Provider, MD  donepezil (ARICEPT) 5 MG tablet Take 5 mg by mouth at bedtime.   Yes Historical Provider, MD  dorzolamide-timolol (COSOPT) 22.3-6.8 MG/ML ophthalmic solution Place 1 drop into the right eye 2 (two) times daily.   Yes Historical Provider, MD  fluticasone (FLONASE) 50 MCG/ACT nasal spray Place into both nostrils daily.   Yes Historical Provider, MD  glucosamine-chondroitin (GLUCOSAMINE-CHONDROITIN DS) 500-400 MG tablet Take 1 tablet by mouth 3 (three) times daily. Reported on 06/09/2016   Yes Historical Provider, MD  hydroxypropyl methylcellulose / hypromellose (ISOPTO TEARS / GONIOVISC) 2.5 % ophthalmic solution Place 1 drop into both eyes as needed for dry eyes.    Yes Historical Provider, MD  ibuprofen (ADVIL,MOTRIN) 400 MG tablet Take 400 mg by mouth 3 (three) times daily. Reported on 06/09/2016   Yes Historical Provider, MD  latanoprost (XALATAN) 0.005 % ophthalmic solution Place 1 drop into both eyes at bedtime. Reported on 06/09/2016   Yes Historical Provider, MD  levothyroxine (SYNTHROID,  LEVOTHROID) 100 MCG tablet Take 100 mcg by mouth daily before breakfast. Reported on 06/09/2016   Yes Historical Provider, MD  loratadine (CLARITIN) 10 MG tablet Take 10 mg by mouth daily.   Yes Historical Provider, MD  Melatonin 3 MG CAPS Take 1 capsule by mouth at bedtime. Reported on 06/09/2016   Yes Historical Provider, MD  memantine (NAMENDA) 5 MG tablet Take 5 mg by mouth 2 (two) times daily.   Yes Historical Provider, MD  Omega-3 Fatty Acids (FISH OIL) 1000 MG CAPS Take  1 capsule by mouth daily. Reported on 06/09/2016   Yes Historical Provider, MD  risperiDONE (RISPERDAL) 0.25 MG tablet Take 0.5 mg by mouth 2 (two) times daily.    Yes Historical Provider, MD  sodium chloride (OCEAN) 0.65 % SOLN nasal spray Place 1 spray into both nostrils 5 (five) times daily. Reported on 06/09/2016   Yes Historical Provider, MD  traZODone (DESYREL) 50 MG tablet Take 25 mg by mouth at bedtime.    Yes Historical Provider, MD  vitamin B-12 (CYANOCOBALAMIN) 1000 MCG tablet Take 1,000 mcg by mouth daily.   Yes Historical Provider, MD  rivastigmine (EXELON) 3 MG capsule Take 1 capsule (3 mg total) by mouth 2 (two) times daily. Patient not taking: Reported on 12/17/2016 11/04/14   Venia Carbon, MD    Allergies Cefdinir; Codeine; Ibandronic acid; Latex; Nitrofurantoin; and Raloxifene  Family History  Problem Relation Age of Onset  . Heart disease Father   . Cancer Brother     all except 1 died of cancer    Social History Social History  Substance Use Topics  . Smoking status: Never Smoker  . Smokeless tobacco: Never Used  . Alcohol use No    Review of Systems Constitutional: No fever/chills Eyes: No visual changes. ENT: No sore throat. Cardiovascular: Denies chest pain. Respiratory: Denies shortness of breath. Gastrointestinal: No abdominal pain.  No nausea, no vomiting.  No diarrhea.  No constipation. Genitourinary: Negative for dysuria. Musculoskeletal: Negative for back pain. Skin: Negative for rash. Neurological: Generalized weakness  10-point ROS otherwise negative.  ____________________________________________   PHYSICAL EXAM:  VITAL SIGNS: ED Triage Vitals  Enc Vitals Group     BP 12/16/16 1932 (!) 163/68     Pulse Rate 12/16/16 1932 60     Resp --      Temp 12/16/16 1932 98.1 F (36.7 C)     Temp Source 12/16/16 1932 Oral     SpO2 12/16/16 1932 98 %     Weight 12/16/16 1939 135 lb (61.2 kg)     Height --      Head Circumference --       Peak Flow --      Pain Score 12/16/16 1938 5     Pain Loc --      Pain Edu? --      Excl. in [place]? --     Constitutional: Alert and Disoriented as is baseline. Well appearing and in no acute distress. Eyes: Conjunctivae are normal. PERRL. EOMI. Head: Atraumatic. Nose: No congestion/rhinnorhea. Mouth/Throat: Mucous membranes are moist.  Oropharynx non-erythematous. Cardiovascular: Normal rate, regular rhythm. Grossly normal heart sounds.  Good peripheral circulation. Respiratory: Normal respiratory effort.  No retractions. Lungs CTAB. Gastrointestinal: Soft With some mild left lower quadrant tenderness to palpation. No distention. Positive bowel sounds Musculoskeletal: No lower extremity tenderness nor edema.   Neurologic:  Normal speech and language.  Skin:  Skin is warm, dry and intact.  Psychiatric: Mood and affect are  normal.   ____________________________________________   LABS (all labs ordered are listed, but only abnormal results are displayed)  Labs Reviewed  CBC - Abnormal; Notable for the following:       Result Value   RBC 3.60 (*)    Hemoglobin 11.5 (*)    HCT 33.4 (*)    RDW 15.5 (*)    All other components within normal limits  COMPREHENSIVE METABOLIC PANEL - Abnormal; Notable for the following:    Sodium 127 (*)    Chloride 93 (*)    All other components within normal limits  URINALYSIS, COMPLETE (UACMP) WITH MICROSCOPIC - Abnormal; Notable for the following:    Color, Urine STRAW (*)    APPearance CLEAR (*)    Hgb urine dipstick SMALL (*)    Squamous Epithelial / LPF 0-5 (*)    All other components within normal limits  HEPATIC FUNCTION PANEL - Abnormal; Notable for the following:    Bilirubin, Direct <0.1 (*)    All other components within normal limits  TSH - Abnormal; Notable for the following:    TSH 47.074 (*)    All other components within normal limits  TROPONIN I  LACTIC ACID, PLASMA  T4    ____________________________________________  EKG  ED ECG REPORT I, Loney Hering, the attending physician, personally viewed and interpreted this ECG.   Date: 12/17/2016  EKG Time: 132  Rate: 54  Rhythm: sinus bradycardia  Axis: normal  Intervals:none  ST&T Change: none  ____________________________________________  RADIOLOGY  CT head Chest x-ray ____________________________________________   PROCEDURES  Procedure(s) performed: None  Procedures  Critical Care performed: No  ____________________________________________   INITIAL IMPRESSION / ASSESSMENT AND PLAN / ED COURSE  Pertinent labs & imaging results that were available during my care of the patient were reviewed by me and considered in my medical decision making (see chart for details).  This is a 75 year old female who comes into the hospital today with some increased weakness over the past few months. The family is concerned that this may be due to another UTI that has not cleared. The patient has had many clean-catch urinalysis he is but has not had a catheterization. I will catheter the patient for urine. I will also check some other blood work including the patient's thyroid studies. I will reassess the patient.  Clinical Course as of Dec 18 143  Sat Dec 17, 2016  0050 Stable atrophy and chronic small vessel ischemia. No CT findings of acute abnormality.   CT Head Wo Contrast [AW]  0059 Stable cardiomegaly with left basilar atelectasis. Biapical pleuroparenchymal thickening and scarring.   DG Chest 2 View [AW]    Clinical Course User Index [AW] Loney Hering, MD   The patient does have some mild hyponatremia. She also has a very elevated TSH with a concern that her thyroid levels may be off. I will give the patient a 500, bolus of normal saline. I will check an EKG analysis and the patient to the hospitalist service weakness.  I confirmed with the patient's son and he states that  the patient's face does seem fuller and her tongue does seem securing her skin seems drier. I feel that this is all symptoms of her hypothyroid and possibly some mild myxedema. The patient again will be admitted to the hospitalist service.  ____________________________________________   FINAL CLINICAL IMPRESSION(S) / ED DIAGNOSES  Final diagnoses:  Weakness  Postoperative hypothyroidism  Hyponatremia      NEW MEDICATIONS STARTED  DURING THIS VISIT:  New Prescriptions   No medications on file     Note:  This document was prepared using Dragon voice recognition software and may include unintentional dictation errors.    Loney Hering, MD 12/17/16 [phone]

## 2016-12-17 NOTE — H&P (Signed)
Julia Alvarado is an 75 y.o. female.   Chief Complaint: Weakness HPI: The patient with past medical history of dementia and hypothyroidism presents emergency department from her nursing home due to weakness. She cannot contribute to her own history. The patient has been treated multiple times over the last few months for recurrent urinary tract infection. Source of weakness was thought to be the same this admission however the patient seemed to have slowed behavior is well as nondependent edema. Laboratory evaluation revealed significantly elevated TSH. Urine appears to be clean. Due to her relative inability to ambulate secondary to weakness and mild myxedema the emergency department staff called the hospitalist service for admission.  Past Medical History:  Diagnosis Date  . Arthritis   . Breast cancer ([place])   . Breast cancer ([place])   . GERD (gastroesophageal reflux disease)   . Hyperlipemia   . Hypertension   . Hypothyroidism   . Lewy body dementia without behavioral disturbance   . Sacral fracture ([place]) 1/15    Past Surgical History:  Procedure Laterality Date  . ABDOMINAL HYSTERECTOMY    . CATARACT EXTRACTION W/ INTRAOCULAR LENS IMPLANT Right   . CATARACT EXTRACTION W/PHACO Right 06/15/2016   Procedure: CATARACT EXTRACTION PHACO AND INTRAOCULAR LENS PLACEMENT (IOC);  Surgeon: Leandrew Koyanagi, MD;  Location: [place];  Service: Ophthalmology;  Laterality: Right;  . MASTECTOMY Right ~2005  . MASTECTOMY    . RETINAL DETACHMENT REPAIR W/ SCLERAL BUCKLE LE      Family History  Problem Relation Age of Onset  . Heart disease Father   . Cancer Brother     all except 1 died of cancer   Social History:  reports that she has never smoked. She has never used smokeless tobacco. She reports that she does not drink alcohol or use drugs.  Allergies:  Allergies  Allergen Reactions  . Cefdinir Other (See Comments)    Liquid form causes oral thrush  . Codeine Nausea And  Vomiting    nausea  . Ibandronic Acid Nausea Only  . Latex Hives  . Nitrofurantoin     Other reaction(s): Vomiting  . Raloxifene Other (See Comments)    Per pt, "eye doctor said I cannot take"    Medications Prior to Admission  Medication Sig Dispense Refill  . citalopram (CELEXA) 20 MG tablet Take 20 mg by mouth daily.    Marland Kitchen donepezil (ARICEPT) 5 MG tablet Take 5 mg by mouth at bedtime.    . dorzolamide-timolol (COSOPT) 22.3-6.8 MG/ML ophthalmic solution Place 1 drop into the right eye 2 (two) times daily.    . fluticasone (FLONASE) 50 MCG/ACT nasal spray Place into both nostrils daily.    Marland Kitchen glucosamine-chondroitin (GLUCOSAMINE-CHONDROITIN DS) 500-400 MG tablet Take 1 tablet by mouth 3 (three) times daily. Reported on 06/09/2016    . hydroxypropyl methylcellulose / hypromellose (ISOPTO TEARS / GONIOVISC) 2.5 % ophthalmic solution Place 1 drop into both eyes as needed for dry eyes.     Marland Kitchen ibuprofen (ADVIL,MOTRIN) 400 MG tablet Take 400 mg by mouth 3 (three) times daily. Reported on 06/09/2016    . latanoprost (XALATAN) 0.005 % ophthalmic solution Place 1 drop into both eyes at bedtime. Reported on 06/09/2016    . levothyroxine (SYNTHROID, LEVOTHROID) 100 MCG tablet Take 100 mcg by mouth daily before breakfast. Reported on 06/09/2016    . loratadine (CLARITIN) 10 MG tablet Take 10 mg by mouth daily.    . Melatonin 3 MG CAPS Take 1 capsule by mouth  at bedtime. Reported on 06/09/2016    . memantine (NAMENDA) 5 MG tablet Take 5 mg by mouth 2 (two) times daily.    . Omega-3 Fatty Acids (FISH OIL) 1000 MG CAPS Take 1 capsule by mouth daily. Reported on 06/09/2016    . risperiDONE (RISPERDAL) 0.25 MG tablet Take 0.5 mg by mouth 2 (two) times daily.     . sodium chloride (OCEAN) 0.65 % SOLN nasal spray Place 1 spray into both nostrils 5 (five) times daily. Reported on 06/09/2016    . traZODone (DESYREL) 50 MG tablet Take 25 mg by mouth at bedtime.     . vitamin B-12 (CYANOCOBALAMIN) 1000 MCG tablet Take  1,000 mcg by mouth daily.    . rivastigmine (EXELON) 3 MG capsule Take 1 capsule (3 mg total) by mouth 2 (two) times daily. (Patient not taking: Reported on 12/17/2016) 60 capsule 1    Results for orders placed or performed during the hospital encounter of 12/16/16 (from the past 48 hour(s))  CBC     Status: Abnormal   Collection Time: 12/16/16  7:39 PM  Result Value Ref Range   WBC 5.1 3.6 - 11.0 K/uL   RBC 3.60 (L) 3.80 - 5.20 MIL/uL   Hemoglobin 11.5 (L) 12.0 - 16.0 g/dL   HCT 33.4 (L) 35.0 - 47.0 %   MCV 92.7 80.0 - 100.0 fL   MCH 31.9 26.0 - 34.0 pg   MCHC 34.5 32.0 - 36.0 g/dL   RDW 15.5 (H) 11.5 - 14.5 %   Platelets 279 150 - 440 K/uL  Comprehensive metabolic panel     Status: Abnormal   Collection Time: 12/16/16  7:39 PM  Result Value Ref Range   Sodium 127 (L) 135 - 145 mmol/L   Potassium 3.5 3.5 - 5.1 mmol/L   Chloride 93 (L) 101 - 111 mmol/L   CO2 26 22 - 32 mmol/L   Glucose, Bld 95 65 - 99 mg/dL   BUN 9 6 - 20 mg/dL   Creatinine, Ser 0.68 0.44 - 1.00 mg/dL   Calcium 9.2 8.9 - 10.3 mg/dL   Total Protein 6.9 6.5 - 8.1 g/dL   Albumin 4.1 3.5 - 5.0 g/dL   AST 19 15 - 41 U/L   ALT 14 14 - 54 U/L   Alkaline Phosphatase 50 38 - 126 U/L   Total Bilirubin 0.8 0.3 - 1.2 mg/dL   GFR calc non Af Amer >60 >60 mL/min   GFR calc Af Amer >60 >60 mL/min    Comment: (NOTE) The eGFR has been calculated using the CKD EPI equation. This calculation has not been validated in all clinical situations. eGFR's persistently <60 mL/min signify possible Chronic Kidney Disease.    Anion gap 8 5 - 15  Troponin I     Status: None   Collection Time: 12/16/16  7:39 PM  Result Value Ref Range   Troponin I <0.03 <0.03 ng/mL  Hepatic function panel     Status: Abnormal   Collection Time: 12/16/16  7:39 PM  Result Value Ref Range   Total Protein 6.8 6.5 - 8.1 g/dL   Albumin 4.0 3.5 - 5.0 g/dL   AST 21 15 - 41 U/L   ALT 14 14 - 54 U/L   Alkaline Phosphatase 48 38 - 126 U/L   Total  Bilirubin 0.6 0.3 - 1.2 mg/dL   Bilirubin, Direct <0.1 (L) 0.1 - 0.5 mg/dL   Indirect Bilirubin NOT CALCULATED 0.3 - 0.9 mg/dL  TSH  Status: Abnormal   Collection Time: 12/16/16  7:39 PM  Result Value Ref Range   TSH 47.074 (H) 0.350 - 4.500 uIU/mL    Comment: Performed by a 3rd Generation assay with a functional sensitivity of <=0.01 uIU/mL.  Urinalysis, Complete w Microscopic     Status: Abnormal   Collection Time: 12/16/16 11:50 PM  Result Value Ref Range   Color, Urine STRAW (A) YELLOW   APPearance CLEAR (A) CLEAR   Specific Gravity, Urine 1.005 1.005 - 1.030   pH 6.0 5.0 - 8.0   Glucose, UA NEGATIVE NEGATIVE mg/dL   Hgb urine dipstick SMALL (A) NEGATIVE   Bilirubin Urine NEGATIVE NEGATIVE   Ketones, ur NEGATIVE NEGATIVE mg/dL   Protein, ur NEGATIVE NEGATIVE mg/dL   Nitrite NEGATIVE NEGATIVE   Leukocytes, UA NEGATIVE NEGATIVE   RBC / HPF 0-5 0 - 5 RBC/hpf   WBC, UA 0-5 0 - 5 WBC/hpf   Bacteria, UA NONE SEEN NONE SEEN   Squamous Epithelial / LPF 0-5 (A) NONE SEEN   Mucous PRESENT   Lactic acid, plasma     Status: None   Collection Time: 12/16/16 11:50 PM  Result Value Ref Range   Lactic Acid, Venous 0.8 0.5 - 1.9 mmol/L   Dg Chest 2 View  Result Date: 12/17/2016 CLINICAL DATA:  Weakness, body ache and pain.  No fever. EXAM: CHEST  2 VIEW COMPARISON:  CXR 06/20/2015, right shoulder radiographs 08/15/2015 FINDINGS: There is stable cardiomegaly with chronic mild interstitial prominence and biapical pleuroparenchymal scarring and slight thickening. Left lower lobe atelectasis and/or scarring is noted. No pneumonic consolidation or overt pulmonary edema. No pneumothorax. Minimal blunting of the posterior costophrenic angle consistent with tiny effusion. Old right surgical neck fracture. IMPRESSION: Stable cardiomegaly with left basilar atelectasis. Biapical pleuroparenchymal thickening and scarring. Electronically Signed   By: Ashley Royalty M.D.   On: 12/17/2016 00:55   Ct Head Wo  Contrast  Result Date: 12/17/2016 CLINICAL DATA:  Increasing weakness. EXAM: CT HEAD WITHOUT CONTRAST TECHNIQUE: Contiguous axial images were obtained from the base of the skull through the vertex without intravenous contrast. COMPARISON:  07/23/2015 FINDINGS: Brain: No evidence of acute infarction, hemorrhage, hydrocephalus, extra-axial collection or mass lesion/mass effect. Generalized atrophy and chronic small vessel ischemia, stable from prior exam. Possible remote ischemia inferior right frontal lobe. Vascular: No hyperdense vessel or unexpected calcification. Skull: Normal. Negative for fracture or focal lesion. Sinuses/Orbits: Minimal opacification of lower right mastoid air cells. Paranasal sinuses are well-aerated. Other: None. IMPRESSION: Stable atrophy and chronic small vessel ischemia. No CT findings of acute abnormality. Electronically Signed   By: Jeb Levering M.D.   On: 12/17/2016 00:39    Review of Systems  Unable to perform ROS: Dementia    Blood pressure (!) 162/54, pulse (!) 58, temperature 98.2 F (36.8 C), temperature source Oral, resp. rate 20, height 5' (1.524 m), weight 61.2 kg (134 lb 14.4 oz), SpO2 99 %. Physical Exam  Nursing note and vitals reviewed. Constitutional: She is oriented to person, place, and time. She appears well-developed and well-nourished. No distress.  HENT:  Head: Normocephalic and atraumatic.  Mouth/Throat: Oropharynx is clear and moist.  Eyes: Conjunctivae and EOM are normal. Pupils are equal, round, and reactive to light. No scleral icterus.  Neck: Normal range of motion. Neck supple. No JVD present. No tracheal deviation present. No thyromegaly present.  Cardiovascular: Normal rate, regular rhythm and normal heart sounds.  Exam reveals no gallop and no friction rub.   No murmur  heard. Respiratory: Effort normal and breath sounds normal.  GI: Soft. Bowel sounds are normal. She exhibits no distension. There is no tenderness.  Genitourinary:   Genitourinary Comments: Deferred  Musculoskeletal: She exhibits edema (trace- face, hand, pre-tibial).  Lymphadenopathy:    She has no cervical adenopathy.  Neurological: She is alert and oriented to person, place, and time. No cranial nerve deficit. She exhibits normal muscle tone.  Skin: Skin is warm and dry. No rash noted. No erythema.  Psychiatric: She has a normal mood and affect. Her behavior is normal. Judgment and thought content normal.     Assessment/Plan This is a 75 year old female admitted for myxedema. 1. Myxedema: Trace edema and nondependent swelling of the face. I have ordered a dose of Synthroid IV. We will continue with oral thyroid hormone replacement. T4 pending. No signs or symptoms of heart failure at this time. Likely etiology is not taking medication on an empty stomach or missed administrations of medication at her nursing facility. The patient's family states that she does not pocket medicine and her mouth. 2. 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 I have encouraged the latter to be given only as needed. Encourage by mouth intake. Replete thyroid hormone. 3. Dementia: Continue Aricept and Namenda 4. Depression: Continue Celexa 5. DVT prophylaxis: Lovenox 6. GI prophylaxis: None The patient is a full code. Time spent on admission orders and care approximately 45 minutes  Harrie Foreman, MD 12/17/2016, 7:24 AM

## 2016-12-17 NOTE — Progress Notes (Signed)
PHARMACIST - PHYSICIAN ORDER COMMUNICATION  CONCERNING: P&T Medication Policy on Herbal Medications  DESCRIPTION:  This patient's order for:  Glucosamine-chondroitin  has been noted.  This product(s) is classified as an "herbal" or natural product. Due to a lack of definitive safety studies or FDA approval, nonstandard manufacturing practices, plus the potential risk of unknown drug-drug interactions while on inpatient medications, the Pharmacy and Therapeutics Committee does not permit the use of "herbal" or natural products of this type within [place].   ACTION TAKEN: The pharmacy department is unable to verify this order at this time  Please reevaluate patient's clinical condition at discharge and address if the herbal or natural product(s) should be resumed at that time.    

## 2016-12-17 NOTE — ED Notes (Signed)
Patient transported to X-ray 

## 2016-12-17 NOTE — Progress Notes (Signed)
Met with patient's family, she is essentially back to her baseline Anticipated discharge in the morning

## 2016-12-17 NOTE — Evaluation (Signed)
Physical Therapy Evaluation Patient Details Name: Julia Alvarado MRN: [id number] DOB: 1941-11-25 Today's Date: 12/17/2016   History of Present Illness  presented to ER secondary to progressive LE weakness, inability to walk; admitted with myxedema.  CTH negative for acute change; orthostatic vital signs negative.  Clinical Impression  Upon evaluation, patient alert and oriented to self only; follows simple commands, but remains globally confused with limited/no ability to carry-over new information at this time. Bilat UE/LEs generally weak and deconditioned, but functional for basic transfers and gait (at least 4-/5).  Denies pain, except bilat great toes (unrated). Currently requiring mod assist for bed mobility; min/mod assist for sit/stand, basic transfers and gait (4') with RW.  Manual facilitation for anterior/lateral weight shifting with gait efforts; poor ability to maintian balance outside immediate BOS.  Recommend use of RW and +1 assist for all mobility at this time. Would benefit from skilled PT to address above deficits and promote optimal return to PLOF; recommend transition to STR upon discharge from acute hospitalization.     Follow Up Recommendations SNF    Equipment Recommendations  Rolling walker with 5" wheels    Recommendations for Other Services       Precautions / Restrictions Precautions Precautions: Fall Restrictions Weight Bearing Restrictions: No      Mobility  Bed Mobility Overal bed mobility: Needs Assistance Bed Mobility: Supine to Sit     Supine to sit: Mod assist        Transfers Overall transfer level: Needs assistance Equipment used: Rolling walker (2 wheeled) Transfers: Sit to/from Stand Sit to Stand: Min assist;Mod assist            Ambulation/Gait Ambulation/Gait assistance: Min assist Ambulation Distance (Feet): 60 Feet Assistive device: Rolling walker (2 wheeled)     Gait velocity interpretation: <1.8 ft/sec,  indicative of risk for recurrent falls General Gait Details: shuffling gait pattern with decreased heel strike/toe off bilat, excessive L > R IR throughout gait cycle.  Poor balance reactions; requires min assist/hands-on assist for optimal safety with mobility.  Stairs            Wheelchair Mobility    Modified Rankin (Stroke Patients Only)       Balance Overall balance assessment: Needs assistance Sitting-balance support: Feet supported;No upper extremity supported Sitting balance-Leahy Scale: Good     Standing balance support: Bilateral upper extremity supported Standing balance-Leahy Scale: Fair                               Pertinent Vitals/Pain Pain Assessment: No/denies pain    Home Living                   Additional Comments: Patient unable to provide-reports living in [place] with her mother    Prior Function           Comments: Patient unable to provide-appears to utilize RW with mobility (fairly comfortable with use of device)     Hand Dominance        Extremity/Trunk Assessment   Upper Extremity Assessment Upper Extremity Assessment: Generalized weakness    Lower Extremity Assessment Lower Extremity Assessment: Generalized weakness (grossly 4-/5 throughout)       Communication   Communication: No difficulties  Cognition Arousal/Alertness: Awake/alert Behavior During Therapy: WFL for tasks assessed/performed Overall Cognitive Status: History of cognitive impairments - at baseline  General Comments: oriented to self only; follows simple, one-step commands, but demonstrates limited/no carry-over of new information, absent awareness of situation, deficits and need for assist    General Comments      Exercises     Assessment/Plan    PT Assessment Patient needs continued PT services  PT Problem List Decreased strength;Decreased activity tolerance;Decreased balance;Decreased mobility;Decreased  coordination;Decreased cognition;Decreased knowledge of use of DME;Decreased knowledge of precautions;Decreased safety awareness;Cardiopulmonary status limiting activity          PT Treatment Interventions DME instruction;Gait training;Functional mobility training;Therapeutic activities;Therapeutic exercise;Balance training;Cognitive remediation;Patient/family education    PT Goals (Current goals can be found in the Care Plan section)  Acute Rehab PT Goals PT Goal Formulation: Patient unable to participate in goal setting Time For Goal Achievement: 12/31/16 Potential to Achieve Goals: Fair    Frequency Min 2X/week   Barriers to discharge Decreased caregiver support      Co-evaluation               End of Session Equipment Utilized During Treatment: Gait belt Activity Tolerance: Patient tolerated treatment well Patient left: in chair;with call bell/phone within reach;with chair alarm set Nurse Communication: Mobility status         Time: 1043-1106 PT Time Calculation (min) (ACUTE ONLY): 23 min   Charges:   PT Evaluation $PT Eval Low Complexity: 1 Procedure     PT G Codes:        Miqueas Whilden H. Owens Shark, PT, DPT, NCS 12/17/16, 11:33 AM [phone]

## 2016-12-17 NOTE — Plan of Care (Signed)
Problem: Education: Goal: Knowledge of [place] [facility] Education information/materials will improve Outcome: Not Progressing Patient has dementia.  Problem: Safety: Goal: Ability to remain free from injury will improve Outcome: Progressing Patient has bed alarm on due to being unsteady on her feet.  Problem: Health Behavior/Discharge Planning: Goal: Ability to manage health-related needs will improve Outcome: Not Progressing Patient has dementia and son is power of attorney.  Problem: Activity: Goal: Risk for activity intolerance will decrease Outcome: Progressing Patient is two assist to bathroom and uses a walker to ambulate.

## 2016-12-18 ENCOUNTER — Encounter: Payer: Self-pay | Admitting: *Deleted

## 2016-12-18 LAB — BASIC METABOLIC PANEL
ANION GAP: 8 (ref 5–15)
BUN: 8 mg/dL (ref 6–20)
CO2: 26 mmol/L (ref 22–32)
CREATININE: 0.57 mg/dL (ref 0.44–1.00)
Calcium: 9 mg/dL (ref 8.9–10.3)
Chloride: 97 mmol/L — ABNORMAL LOW (ref 101–111)
GLUCOSE: 101 mg/dL — AB (ref 65–99)
Potassium: 3.3 mmol/L — ABNORMAL LOW (ref 3.5–5.1)
Sodium: 131 mmol/L — ABNORMAL LOW (ref 135–145)

## 2016-12-18 LAB — HEMOGLOBIN A1C
Hgb A1c MFr Bld: 5.4 % (ref 4.8–5.6)
Mean Plasma Glucose: 108 mg/dL

## 2016-12-18 MED ORDER — LEVOTHYROXINE SODIUM 112 MCG PO TABS: 112.0000 ug | ORAL_TABLET | Freq: Every day | ORAL | 0 refills | Status: DC

## 2016-12-18 NOTE — Clinical Social Work Note (Signed)
Clinical Social Work Assessment  Patient Details  Name: Julia Alvarado MRN: [id number] Date of Birth: 04/03/1941  Date of referral:  12/18/16               Reason for consult:  Facility Placement                Permission sought to share information with:  Facility Art therapist granted to share information::  Yes, Verbal Permission Granted  Name::        Agency::     Relationship::     Contact Information:     Housing/Transportation Living arrangements for the past 2 months:  Group Home Source of Information:  Adult Children Patient Interpreter Needed:  None Criminal Activity/Legal Involvement Pertinent to Current Situation/Hospitalization:  No - Comment as needed Significant Relationships:  Adult Children, Community Support Lives with:  Facility Resident Do you feel safe going back to the place where you live?  Yes Need for family participation in patient care:  No (Coment)  Care giving concerns:  [facility] Worker assessment / plan:  CSW alerted to facility resident through chart review. CSW contacted the patient's daughter Mardene Celeste to find out which facility: [facility]. Mardene Celeste reported that the family is refusing SNF (PT recommendation) and would prefer she return to the [facility]. Mardene Celeste reported that the patient's brother would be transporting her to the Home. CSW attempted to contact group home with no answer. CSW will con't to attempt contact for dc planning.   Employment status:  Retired Forensic scientist:  Medicare PT Recommendations:  [place] / Referral to community resources:   (None)  Patient/Family's Response to care:  Family thanked CSW for assistance.  Patient/Family's Understanding of and Emotional Response to Diagnosis, Current Treatment, and Prognosis:  Family involved with care and aware of needs.  Emotional Assessment Appearance:  Appears  stated age Attitude/Demeanor/Rapport:   (Patient has dementia.) Affect (typically observed):   (Patient has dementia.) Orientation:  Oriented to Self Alcohol / Substance use:  Never Used Psych involvement (Current and /or in the community):  No (Comment)  Discharge Needs  Concerns to be addressed:  Discharge Planning Concerns Readmission within the last 30 days:  No Current discharge risk:  None Barriers to Discharge:  No Barriers Identified   Zettie Pho, LCSW 12/18/2016, 10:05 AM

## 2016-12-18 NOTE — NC FL2 (Signed)
[place] LEVEL OF CARE SCREENING TOOL     IDENTIFICATION  Patient Name: Julia Alvarado Birthdate: 07/26/1941 Sex: female Admission Date (Current Location): 12/16/2016  [facility] and [place] Number:  Engineering geologist and Address:  [facility], [address] [place], [place] [address]      Provider Number:    Attending Physician Name and Address:  Lytle Butte, MD  Relative Name and Phone Number:       Current Level of Care: Hospital Recommended Level of Care: [facility] Prior Approval Number:    Date Approved/Denied:   PASRR Number:    Discharge Plan: Other (Comment) [facility])    Current Diagnoses: Patient Active Problem List   Diagnosis Date Noted  . 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 GERD (gastroesophageal reflux disease)   . Hypothyroidism   . Sacral fracture ([place])   . Lewy body dementia without behavioral disturbance   . Breast cancer ([place])     Orientation RESPIRATION BLADDER Height & Weight     Self  Normal Continent Weight: 137 lb 8 oz (62.4 kg) Height:  5' (152.4 cm)  BEHAVIORAL SYMPTOMS/MOOD NEUROLOGICAL BOWEL NUTRITION STATUS      Continent    AMBULATORY STATUS COMMUNICATION OF NEEDS Skin   Limited Assist Non-Verbally Normal                       Personal Care Assistance Level of Assistance  Bathing Bathing Assistance: Limited assistance   Dressing Assistance: Limited assistance     Functional Limitations Info             SPECIAL CARE FACTORS FREQUENCY                       Contractures Contractures Info: Present    Additional Factors Info  Allergies   Allergies Info: Cefdinir, Codeine, Ibandronic Acid, Latex, Nitrofurantoin, Raloxifene           Current Medications (12/18/2016):  This is the current hospital active medication list Current Facility-Administered Medications  Medication Dose Route Frequency Provider Last Rate Last Dose  . acetaminophen (TYLENOL)  tablet 650 mg  650 mg Oral Q6H PRN Harrie Foreman, MD   650 mg at 12/17/16 0813   Or  . acetaminophen (TYLENOL) suppository 650 mg  650 mg Rectal Q6H PRN Harrie Foreman, MD      . citalopram (CELEXA) tablet 20 mg  20 mg Oral Daily Harrie Foreman, MD   20 mg at 12/18/16 [id number]  . docusate sodium (COLACE) capsule 100 mg  100 mg Oral BID Harrie Foreman, MD   100 mg at 12/18/16 [id number]  . donepezil (ARICEPT) tablet 5 mg  5 mg Oral QHS Harrie Foreman, MD   5 mg at 12/17/16 2103  . dorzolamide-timolol (COSOPT) 22.3-6.8 MG/ML ophthalmic solution 1 drop  1 drop Right Eye BID Harrie Foreman, MD   1 drop at 12/18/16 0830  . enoxaparin (LOVENOX) injection 40 mg  40 mg Subcutaneous Q24H Harrie Foreman, MD   40 mg at 12/17/16 2102  . fluticasone (FLONASE) 50 MCG/ACT nasal spray 1 spray  1 spray Each Nare Daily Harrie Foreman, MD   1 spray at 12/18/16 0830  . hydrALAZINE (APRESOLINE) injection 10 mg  10 mg Intravenous Q4H PRN Lytle Butte, MD      . ibuprofen (ADVIL,MOTRIN) tablet 400 mg  400 mg Oral TID Norva Riffle  Marcille Blanco, MD   400 mg at 12/18/16 [id number]  . latanoprost (XALATAN) 0.005 % ophthalmic solution 1 drop  1 drop Both Eyes QHS Harrie Foreman, MD   1 drop at 12/17/16 2101  . levothyroxine (SYNTHROID, LEVOTHROID) tablet 100 mcg  100 mcg Oral QAC breakfast Harrie Foreman, MD   100 mcg at 12/18/16 [id number]  . loratadine (CLARITIN) tablet 10 mg  10 mg Oral Daily Harrie Foreman, MD   10 mg at 12/18/16 [id number]  . Melatonin TABS 2.5 mg  2.5 mg Oral QHS Harrie Foreman, MD   2.5 mg at 12/17/16 2103  . memantine (NAMENDA) tablet 5 mg  5 mg Oral BID Harrie Foreman, MD   5 mg at 12/18/16 [id number]  . omega-3 acid ethyl esters (LOVAZA) capsule 1 g  1 g Oral Daily Harrie Foreman, MD   1 g at 12/18/16 [id number]  . ondansetron (ZOFRAN) tablet 4 mg  4 mg Oral Q6H PRN Harrie Foreman, MD       Or  . ondansetron [facility]) injection 4 mg  4 mg Intravenous Q6H PRN Harrie Foreman, MD      . polyvinyl  alcohol (LIQUIFILM TEARS) 1.4 % ophthalmic solution 1 drop  1 drop Both Eyes PRN Harrie Foreman, MD      . risperiDONE (RISPERDAL) tablet 0.5 mg  0.5 mg Oral BID BM PRN Harrie Foreman, MD      . sodium chloride (OCEAN) 0.65 % nasal spray 1 spray  1 spray Each Nare 5 X Daily Harrie Foreman, MD   1 spray at 12/18/16 0830  . sodium chloride flush (NS) 0.9 % injection 3 mL  3 mL Intravenous Q12H Harrie Foreman, MD   3 mL at 12/18/16 1000  . traZODone (DESYREL) tablet 25 mg  25 mg Oral QHS PRN Harrie Foreman, MD   25 mg at 12/17/16 2301  . vitamin B-12 (CYANOCOBALAMIN) tablet 1,000 mcg  1,000 mcg Oral Daily Harrie Foreman, MD   1,000 mcg at 12/18/16 [id number]     Discharge Medications: DISCHARGE MEDICATIONS:       Current Discharge Medication List        CONTINUE these medications which have CHANGED   Details  levothyroxine (SYNTHROID, LEVOTHROID) 112 MCG tablet Take 1 tablet (112 mcg total) by mouth daily before breakfast. Reported on 06/09/2016 Qty: 30 tablet, Refills: 0          CONTINUE these medications which have NOT CHANGED   Details  citalopram (CELEXA) 20 MG tablet Take 20 mg by mouth daily.    donepezil (ARICEPT) 5 MG tablet Take 5 mg by mouth at bedtime.    dorzolamide-timolol (COSOPT) 22.3-6.8 MG/ML ophthalmic solution Place 1 drop into the right eye 2 (two) times daily.    fluticasone (FLONASE) 50 MCG/ACT nasal spray Place into both nostrils daily.    glucosamine-chondroitin (GLUCOSAMINE-CHONDROITIN DS) 500-400 MG tablet Take 1 tablet by mouth 3 (three) times daily. Reported on 06/09/2016    hydroxypropyl methylcellulose / hypromellose (ISOPTO TEARS / GONIOVISC) 2.5 % ophthalmic solution Place 1 drop into both eyes as needed for dry eyes.     ibuprofen (ADVIL,MOTRIN) 400 MG tablet Take 400 mg by mouth 3 (three) times daily. Reported on 06/09/2016    latanoprost (XALATAN) 0.005 % ophthalmic solution Place 1 drop into both eyes at bedtime. Reported  on 06/09/2016    loratadine (CLARITIN) 10 MG tablet Take 10 mg by mouth daily.  Melatonin 3 MG CAPS Take 1 capsule by mouth at bedtime. Reported on 06/09/2016    memantine (NAMENDA) 5 MG tablet Take 5 mg by mouth 2 (two) times daily.    Omega-3 Fatty Acids (FISH OIL) 1000 MG CAPS Take 1 capsule by mouth daily. Reported on 06/09/2016    risperiDONE (RISPERDAL) 0.25 MG tablet Take 0.5 mg by mouth 2 (two) times daily.     sodium chloride (OCEAN) 0.65 % SOLN nasal spray Place 1 spray into both nostrils 5 (five) times daily. Reported on 06/09/2016    traZODone (DESYREL) 50 MG tablet Take 25 mg by mouth at bedtime.     vitamin B-12 (CYANOCOBALAMIN) 1000 MCG tablet Take 1,000 mcg by mouth daily.         STOP taking these medications     rivastigmine (EXELON) 3 MG capsule       Relevant Imaging Results:  Relevant Lab Results:   Additional Information    Zettie Pho, LCSW

## 2016-12-18 NOTE — Clinical Social Work Note (Signed)
CSW able to contact [facility] representative. [facility] representative reported that their fax is not working and that they would prefer a printed copy of the FL 2. CSW preparing dc packet at their request.   Santiago Bumpers, MSW, LCSW-A [phone]

## 2016-12-18 NOTE — Progress Notes (Signed)
Dr. Lavetta Nielsen was notified of patient c/o left calf pain. No redness and swelling noted.

## 2016-12-18 NOTE — Progress Notes (Signed)
Alert and oriented x 2. Vital signs stable . No signs of acute distress. Discharge instructions given. Family verbalized understanding. No other issues noted at this time. Transported by son to group home.

## 2016-12-18 NOTE — Discharge Instructions (Signed)
Hyponatremia °Introduction °Hyponatremia is when the amount of salt (sodium) in your blood is too low. When salt levels are low, your cells absorb extra water and they swell. The swelling happens throughout the body, but it mostly affects the brain. °Follow these instructions at home: °· Take medicines only as told by your doctor. Many medicines can make this condition worse. Talk with your doctor about any medicines that you are currently taking. °· Carefully follow a recommended diet as told by your doctor. °· Carefully follow instructions from your doctor about fluid restrictions. °· Keep all follow-up visits as told by your doctor. This is important. °· Do not drink alcohol. °Contact a doctor if: °· You feel sicker to your stomach (nauseous). °· You feel more confused. °· You feel more tired (fatigued). °· Your headache gets worse. °· You feel weaker. °· Your symptoms go away and then they come back. °· You have trouble following the diet instructions. °Get help right away if: °· You start to twitch and shake (have a seizure). °· You pass out (faint). °· You keep having watery poop (diarrhea). °· You keep throwing up (vomiting). °This information is not intended to replace advice given to you by your health care provider. Make sure you discuss any questions you have with your health care provider. °Document Released: 08/17/2011 Document Revised: 05/12/2016 Document Reviewed: 12/01/2014 °© 2017 [employer] ° °

## 2016-12-18 NOTE — Discharge Summary (Signed)
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 Arthritis   . Breast cancer ([place])   . Breast cancer ([place])   . GERD (gastroesophageal reflux disease)   . Hyperlipemia   . Hypertension   . Hypothyroidism   . Lewy body dementia without behavioral disturbance   . Sacral fracture [facility]) 1/15    HOSPITAL COURSE:  Julia Alvarado  is a 75 y.o. female admitted 12/16/2016 with chief complaint Weakness . Please see H&P performed by Harrie Foreman, MD for further information. Patient presented with weakness and altered mental status described as lethargy. Noted abnormal TFTs and hyponatremia on admission. Received IV synthroid x1, increase in synthroid. Sodium improved, patient back to baseline per family  DISCHARGE CONDITIONS:   stable  CONSULTS OBTAINED:    DRUG ALLERGIES:   Allergies  Allergen Reactions  . Cefdinir Other (See Comments)    Liquid form causes oral thrush  . Codeine Nausea And Vomiting    nausea  . Ibandronic Acid Nausea Only  . Latex Hives  . Nitrofurantoin     Other reaction(s): Vomiting  . Raloxifene Other (See Comments)    Per pt, "eye doctor said I cannot take"    DISCHARGE MEDICATIONS:   Current Discharge Medication List    CONTINUE these medications which have CHANGED   Details  levothyroxine (SYNTHROID, LEVOTHROID) 112 MCG tablet Take 1 tablet (112 mcg total) by mouth daily before breakfast. Reported on 06/09/2016 Qty: 30 tablet, Refills: 0      CONTINUE these medications which  have NOT CHANGED   Details  citalopram (CELEXA) 20 MG tablet Take 20 mg by mouth daily.    donepezil (ARICEPT) 5 MG tablet Take 5 mg by mouth at bedtime.    dorzolamide-timolol (COSOPT) 22.3-6.8 MG/ML ophthalmic solution Place 1 drop into the right eye 2 (two) times daily.    fluticasone (FLONASE) 50 MCG/ACT nasal spray Place into both nostrils daily.    glucosamine-chondroitin (GLUCOSAMINE-CHONDROITIN DS) 500-400 MG tablet Take 1 tablet by mouth 3 (three) times daily. Reported on 06/09/2016    hydroxypropyl methylcellulose / hypromellose (ISOPTO TEARS / GONIOVISC) 2.5 % ophthalmic solution Place 1 drop into both eyes as needed for dry eyes.     ibuprofen (ADVIL,MOTRIN) 400 MG tablet Take 400 mg by mouth 3 (three) times daily. Reported on 06/09/2016    latanoprost (XALATAN) 0.005 % ophthalmic solution Place 1 drop into both eyes at bedtime. Reported on 06/09/2016    loratadine (CLARITIN) 10 MG tablet Take 10 mg by mouth daily.    Melatonin 3 MG CAPS Take 1 capsule by mouth at bedtime. Reported on 06/09/2016    memantine (NAMENDA) 5 MG tablet Take 5 mg by mouth 2 (two) times daily.    Omega-3 Fatty Acids (FISH OIL) 1000 MG CAPS Take 1 capsule by mouth daily. Reported on 06/09/2016    risperiDONE (RISPERDAL) 0.25 MG tablet Take 0.5 mg by mouth 2 (two) times daily.     sodium chloride (OCEAN) 0.65 % SOLN nasal spray Place 1 spray into both nostrils 5 (five) times daily.  Reported on 06/09/2016    traZODone (DESYREL) 50 MG tablet Take 25 mg by mouth at bedtime.     vitamin B-12 (CYANOCOBALAMIN) 1000 MCG tablet Take 1,000 mcg by mouth daily.      STOP taking these medications     rivastigmine (EXELON) 3 MG capsule          DISCHARGE INSTRUCTIONS:    DIET:  Regular diet  DISCHARGE CONDITION:  Stable  ACTIVITY:  Activity as tolerated  OXYGEN:  Home Oxygen: No.   Oxygen Delivery: room air  DISCHARGE LOCATION:  home   If you experience worsening of your admission  symptoms, develop shortness of breath, life threatening emergency, suicidal or homicidal thoughts you must seek medical attention immediately by calling 911 or calling your MD immediately  if symptoms less severe.  You Must read complete instructions/literature along with all the possible adverse reactions/side effects for all the Medicines you take and that have been prescribed to you. Take any new Medicines after you have completely understood and accpet all the possible adverse reactions/side effects.   Please note  You were cared for by a hospitalist during your hospital stay. If you have any questions about your discharge medications or the care you received while you were in the hospital after you are discharged, you can call the unit and asked to speak with the hospitalist on call if the hospitalist that took care of you is not available. Once you are discharged, your primary care physician will handle any further medical issues. Please note that NO REFILLS for any discharge medications will be authorized once you are discharged, as it is imperative that you return to your primary care physician (or establish a relationship with a primary care physician if you do not have one) for your aftercare needs so that they can reassess your need for medications and monitor your lab values.    On the day of Discharge:   VITAL SIGNS:  Blood pressure (!) 169/79, pulse 76, temperature 98.4 F (36.9 C), temperature source Oral, resp. rate (!) 21, height 5' (1.524 m), weight 62.4 kg (137 lb 8 oz), SpO2 96 %.  I/O:   Intake/Output Summary (Last 24 hours) at 12/18/16 0939 Last data filed at 12/17/16 2101  Gross per 24 hour  Intake              483 ml  Output              200 ml  Net              283 ml    PHYSICAL EXAMINATION:  GENERAL:  75 y.o.-year-old patient lying in the bed with no acute distress.  EYES: Pupils equal, round, reactive to light and accommodation. No scleral icterus. Extraocular  muscles intact.  HEENT: Head atraumatic, normocephalic. Oropharynx and nasopharynx clear.  NECK:  Supple, no jugular venous distention. No thyroid enlargement, no tenderness.  LUNGS: Normal breath sounds bilaterally, no wheezing, rales,rhonchi or crepitation. No use of accessory muscles of respiration.  CARDIOVASCULAR: S1, S2 normal. No murmurs, rubs, or gallops.  ABDOMEN: Soft, non-tender, non-distended. Bowel sounds present. No organomegaly or mass.  EXTREMITIES: No pedal edema, cyanosis, or clubbing.  NEUROLOGIC: Cranial nerves II through XII are intact. Muscle strength 5/5 in all extremities. Sensation intact. Gait not checked.  PSYCHIATRIC: The patient is alert and oriented x 3.  SKIN: No obvious rash, lesion, or ulcer.   DATA REVIEW:   CBC  Recent Labs Lab 12/16/16 1939  WBC 5.1  HGB 11.5*  HCT 33.4*  PLT 279    Chemistries   Recent Labs Lab 12/16/16 1939 12/18/16 0547  NA 127* 131*  K 3.5 3.3*  CL 93* 97*  CO2 26 26  GLUCOSE 95 101*  BUN 9 8  CREATININE 0.68 0.57  CALCIUM 9.2 9.0  AST 21  19  --   ALT 14  14  --   ALKPHOS 48  50  --   BILITOT 0.6  0.8  --     Cardiac Enzymes  Recent Labs Lab 12/16/16 1939  TROPONINI <0.03    Microbiology Results  Results for orders placed or performed during the hospital encounter of 12/16/16  MRSA PCR Screening     Status: None   Collection Time: 12/17/16  6:43 AM  Result Value Ref Range Status   MRSA by PCR NEGATIVE NEGATIVE Final    Comment:        The GeneXpert MRSA Assay (FDA approved for NASAL specimens only), is one component of a comprehensive MRSA colonization surveillance program. It is not intended to diagnose MRSA infection nor to guide or monitor treatment for MRSA infections.     RADIOLOGY:  Dg Chest 2 View  Result Date: 12/17/2016 CLINICAL DATA:  Weakness, body ache and pain.  No fever. EXAM: CHEST  2 VIEW COMPARISON:  CXR 06/20/2015, right shoulder radiographs 08/15/2015 FINDINGS:  There is stable cardiomegaly with chronic mild interstitial prominence and biapical pleuroparenchymal scarring and slight thickening. Left lower lobe atelectasis and/or scarring is noted. No pneumonic consolidation or overt pulmonary edema. No pneumothorax. Minimal blunting of the posterior costophrenic angle consistent with tiny effusion. Old right surgical neck fracture. IMPRESSION: Stable cardiomegaly with left basilar atelectasis. Biapical pleuroparenchymal thickening and scarring. Electronically Signed   By: Ashley Royalty M.D.   On: 12/17/2016 00:55   Ct Head Wo Contrast  Result Date: 12/17/2016 CLINICAL DATA:  Increasing weakness. EXAM: CT HEAD WITHOUT CONTRAST TECHNIQUE: Contiguous axial images were obtained from the base of the skull through the vertex without intravenous contrast. COMPARISON:  07/23/2015 FINDINGS: Brain: No evidence of acute infarction, hemorrhage, hydrocephalus, extra-axial collection or mass lesion/mass effect. Generalized atrophy and chronic small vessel ischemia, stable from prior exam. Possible remote ischemia inferior right frontal lobe. Vascular: No hyperdense vessel or unexpected calcification. Skull: Normal. Negative for fracture or focal lesion. Sinuses/Orbits: Minimal opacification of lower right mastoid air cells. Paranasal sinuses are well-aerated. Other: None. IMPRESSION: Stable atrophy and chronic small vessel ischemia. No CT findings of acute abnormality. Electronically Signed   By: Jeb Levering M.D.   On: 12/17/2016 00:39     Management plans discussed with the patient, family and they are in agreement.  CODE STATUS:     Code Status Orders        Start     Ordered   12/17/16 0514  Full code  Continuous     12/17/16 0513    Code Status History    Date Active Date Inactive Code Status Order ID Comments User Context   This patient has a current code status but no historical code status.    Advance Directive Documentation   Flowsheet Row Most Recent  Value  Type of Advance Directive  Living will, Healthcare Power of Attorney  Pre-existing out of facility DNR order (yellow form or pink MOST form)  No data  "MOST" Form in Place?  No data      TOTAL TIME TAKING CARE OF THIS PATIENT: 33 minutes.  Labrenda Lasky,  Karenann Cai.D on 12/18/2016 at 9:39 AM  Between 7am to 6pm - Pager - [phone]  After 6pm go to [email or web address] - Technical brewer [facility]  Office  [phone]  CC: Primary care physician; Valera Castle, MD

## 2016-12-18 NOTE — Plan of Care (Signed)
Problem: Education: Goal: Knowledge of [facility] Education information/materials will improve Outcome: Not Progressing Patient has dementia.  Problem: Health Behavior/Discharge Planning: Goal: Ability to manage health-related needs will improve Outcome: Not Progressing Patient has dementia.   

## 2016-12-19 LAB — T4: T4 TOTAL: 6.6 ug/dL (ref 4.5–12.0)

## 2017-01-03 ENCOUNTER — Encounter: Payer: Self-pay | Admitting: Urology

## 2017-01-03 ENCOUNTER — Ambulatory Visit (INDEPENDENT_AMBULATORY_CARE_PROVIDER_SITE_OTHER): Payer: Medicare Other | Admitting: Urology

## 2017-01-03 VITALS — BP 137/78 | HR 71 | Ht 63.0 in | Wt 134.7 lb

## 2017-01-03 DIAGNOSIS — N952 Postmenopausal atrophic vaginitis: Secondary | ICD-10-CM

## 2017-01-03 DIAGNOSIS — N390 Urinary tract infection, site not specified: Secondary | ICD-10-CM | POA: Diagnosis not present

## 2017-01-03 DIAGNOSIS — R32 Unspecified urinary incontinence: Secondary | ICD-10-CM

## 2017-01-03 LAB — URINALYSIS, COMPLETE
Bilirubin, UA: NEGATIVE
GLUCOSE, UA: NEGATIVE
KETONES UA: NEGATIVE
Nitrite, UA: NEGATIVE
Specific Gravity, UA: 1.015 (ref 1.005–1.030)
Urobilinogen, Ur: 0.2 mg/dL (ref 0.2–1.0)
pH, UA: 6 (ref 5.0–7.5)

## 2017-01-03 LAB — MICROSCOPIC EXAMINATION
Epithelial Cells (non renal): NONE SEEN /hpf (ref 0–10)
RBC, UA: NONE SEEN /hpf (ref 0–?)
WBC, UA: >30 /hpf — AB (ref 0–?)

## 2017-01-03 LAB — BLADDER SCAN AMB NON-IMAGING: SCAN RESULT: 233

## 2017-01-03 MED ORDER — RISAQUAD PO CAPS: 2.0000 | ORAL_CAPSULE | Freq: Every day | ORAL | Status: DC

## 2017-01-03 MED ORDER — VITAMIN C 500 MG PO TABS: 500.0000 mg | ORAL_TABLET | Freq: Every day | ORAL | 12 refills | Status: DC

## 2017-01-03 NOTE — Progress Notes (Signed)
01/03/2017 10:27 AM   [address] [birth year] [id number]  Referring provider: Valera Castle, MD [address] [place],  [id number]  Chief Complaint  Patient presents with  . New Patient (Initial Visit)    recurrent uti referred by Dr. Kym Groom    HPI: Patient is a 76 -year-old Caucasian female who is referred to [place] by, Dr. Kym Groom, for recurrent urinary tract infections who presents with her transportation, [name] from [facility].    Patient is a poor historian.  Patient states that she has had two to three urinary tract infections over the last year.  Her symptoms with a urinary tract infection consist of burning in the vaginal area and hurting in the bottom of her stomach.  She denies gross hematuria. She has not had any recent fevers, chills, nausea or vomiting.   She does not have a history of nephrolithiasis, GU surgery or GU trauma.   Reviewing her records,  she has had one documented positive urine culture for E. Coli in 2016 with a variable resistance pattern.    She is not sexually active.  She is post menopausal.   She has a history of breast cancer.  She admits to constipation and/or diarrhea.   She does not engage in good perineal hygiene.  She wears pull ups at all time.  She does not take tub baths.   She has incontinence.    She has had a contrast CT on 11/24/2016 which noted no acute finding to explain abdominal pain.  Extensive colonic diverticulosis.  I have independently reviewed the films.    She is drinking 5 glasses of water daily.  Pepsi once in awhile.  She has cranberry juice when she has UTI symptoms.  Her baseline urinary symptoms consist of frequency, dysuria, incontinence, intermittency and hesitancy.  Her CATH UA was malordous, >30 WBC's and moderate bacteria.  Her PVR was 233 mL.    She is complaining of burning in her vaginal area, headache and hurting in her lower stomach today.    PMH: Past Medical History:    Diagnosis Date  . Arthritis   . Breast cancer ([place])   . Breast cancer ([place])   . GERD (gastroesophageal reflux disease)   . Hyperlipemia   . Hypertension   . Hypothyroidism   . Lewy body dementia without behavioral disturbance   . Sacral fracture ([place]) 1/15    Surgical History: Past Surgical History:  Procedure Laterality Date  . ABDOMINAL HYSTERECTOMY    . CATARACT EXTRACTION W/ INTRAOCULAR LENS IMPLANT Right   . CATARACT EXTRACTION W/PHACO Right 06/15/2016   Procedure: CATARACT EXTRACTION PHACO AND INTRAOCULAR LENS PLACEMENT (IOC);  Surgeon: Leandrew Koyanagi, MD;  Location: [facility];  Service: Ophthalmology;  Laterality: Right;  . MASTECTOMY Right ~2005  . MASTECTOMY    . RETINAL DETACHMENT REPAIR W/ SCLERAL BUCKLE LE      Home Medications:  Allergies as of 01/03/2017      Reactions   Cefdinir Other (See Comments)   Liquid form causes oral thrush   Codeine Nausea And Vomiting   nausea   Ibandronic Acid Nausea Only   Latex Hives   Nitrofurantoin    Other reaction(s): Vomiting   Raloxifene Other (See Comments)   Per pt, "eye doctor said I cannot take" Per pt, "eye doctor said I cannot take" Per pt, "eye doctor said I cannot take"      Medication List  Accurate as of 01/03/17 10:27 AM. Always use your most recent med list.          citalopram 20 MG tablet Commonly known as:  CELEXA Take 20 mg by mouth daily.   donepezil 5 MG tablet Commonly known as:  ARICEPT Take 5 mg by mouth at bedtime.   dorzolamide-timolol 22.3-6.8 MG/ML ophthalmic solution Commonly known as:  COSOPT Place 1 drop into the right eye 2 (two) times daily.   Fish Oil 1000 MG Caps Take 1 capsule by mouth daily. Reported on 06/09/2016   fluconazole 150 MG tablet Commonly known as:  DIFLUCAN 1 tab po today and 1 tab in three days.   fluticasone 50 MCG/ACT nasal spray Commonly known as:  FLONASE Place into both nostrils daily.   GLUCOSAMINE-CHONDROITIN DS 500-400 MG  tablet Generic drug:  glucosamine-chondroitin Take 1 tablet by mouth 3 (three) times daily. Reported on 06/09/2016   hydroxypropyl methylcellulose / hypromellose 2.5 % ophthalmic solution Commonly known as:  ISOPTO TEARS / GONIOVISC Place 1 drop into both eyes as needed for dry eyes.   ibuprofen 400 MG tablet Commonly known as:  ADVIL,MOTRIN Take 400 mg by mouth 3 (three) times daily. Reported on 06/09/2016   latanoprost 0.005 % ophthalmic solution Commonly known as:  XALATAN Place 1 drop into both eyes at bedtime. Reported on 06/09/2016   levothyroxine 112 MCG tablet Commonly known as:  SYNTHROID, LEVOTHROID Take 1 tablet (112 mcg total) by mouth daily before breakfast. Reported on 06/09/2016   loratadine 10 MG tablet Commonly known as:  CLARITIN Take 10 mg by mouth daily.   Melatonin 3 MG Caps Take 1 capsule by mouth at bedtime. Reported on 06/09/2016   memantine 5 MG tablet Commonly known as:  NAMENDA Take 5 mg by mouth 2 (two) times daily.   risperiDONE 0.25 MG tablet Commonly known as:  RISPERDAL Take 0.5 mg by mouth 2 (two) times daily.   sodium chloride 0.65 % Soln nasal spray Commonly known as:  OCEAN Place 1 spray into both nostrils 5 (five) times daily. Reported on 06/09/2016   traZODone 50 MG tablet Commonly known as:  DESYREL Take 25 mg by mouth at bedtime.   vitamin B-12 1000 MCG tablet Commonly known as:  CYANOCOBALAMIN Take 1,000 mcg by mouth daily.       Allergies:  Allergies  Allergen Reactions  . Cefdinir Other (See Comments)    Liquid form causes oral thrush  . Codeine Nausea And Vomiting    nausea  . Ibandronic Acid Nausea Only  . Latex Hives  . Nitrofurantoin     Other reaction(s): Vomiting  . Raloxifene Other (See Comments)    Per pt, "eye doctor said I cannot take" Per pt, "eye doctor said I cannot take" Per pt, "eye doctor said I cannot take"    Family History: Family History  Problem Relation Age of Onset  . Heart disease Father    . Cancer Brother     all except 1 died of cancer    Social History:  reports that she has never smoked. She has never used smokeless tobacco. She reports that she does not drink alcohol or use drugs.  ROS: UROLOGY Frequent Urination?: Yes Hard to postpone urination?: No Burning/pain with urination?: Yes Get up at night to urinate?: No Leakage of urine?: Yes Urine stream starts and stops?: Yes Trouble starting stream?: Yes Do you have to strain to urinate?: No Blood in urine?: No Urinary tract infection?: Yes Sexually transmitted disease?:  No Injury to kidneys or bladder?: No Painful intercourse?: No Weak stream?: No Currently pregnant?: No Vaginal bleeding?: No Last menstrual period?: n  Gastrointestinal Nausea?: No Vomiting?: No Indigestion/heartburn?: No Diarrhea?: No Constipation?: No  Constitutional Fever: No Night sweats?: No Weight loss?: No Fatigue?: No  Skin Skin rash/lesions?: No Itching?: No  Eyes Blurred vision?: No Double vision?: No  Ears/Nose/Throat Sore throat?: No Sinus problems?: No  Hematologic/Lymphatic Swollen glands?: No Easy bruising?: No  Cardiovascular Leg swelling?: No Chest pain?: No  Respiratory Cough?: No Shortness of breath?: No  Endocrine Excessive thirst?: No  Musculoskeletal Back pain?: No Joint pain?: No  Neurological Headaches?: No Dizziness?: No  Psychologic Depression?: No Anxiety?: No  Physical Exam: BP 137/78   Pulse 71   Ht 5\' 3"  (1.6 m)   Wt 134 lb 11.2 oz (61.1 kg)   BMI 23.86 kg/m   Constitutional: Well nourished. Alert and oriented, No acute distress. HEENT: [place] AT, moist mucus membranes. Trachea midline, no masses. Cardiovascular: No clubbing, cyanosis, or edema. Respiratory: Normal respiratory effort, no increased work of breathing. GI: Abdomen is soft, non tender, non distended, no abdominal masses. Liver and spleen not palpable.  No hernias appreciated.  Stool sample for occult  testing is not indicated.   GU: No CVA tenderness.  No bladder fullness or masses.  Atrophic external genitalia, normal pubic hair distribution, no lesions.  Normal urethral meatus, no lesions, no prolapse, no discharge.   No urethral masses, tenderness and/or tenderness. No bladder fullness, tenderness or masses. Erythematous vagina mucosa, poor estrogen effect, no discharge, no lesions, good pelvic support, no cystocele.  Rectocele is noted.  Cervix, uterus and adnexa are surgically absent.   Anus and perineum are without rashes or lesions.    Skin: No rashes, bruises or suspicious lesions. Lymph: No cervical or inguinal adenopathy. Neurologic: Grossly intact, no focal deficits, moving all 4 extremities. Psychiatric: Normal mood and affect.  Laboratory Data: Lab Results  Component Value Date   WBC 5.1 12/16/2016   HGB 11.5 (L) 12/16/2016   HCT 33.4 (L) 12/16/2016   MCV 92.7 12/16/2016   PLT 279 12/16/2016    Lab Results  Component Value Date   CREATININE 0.57 12/18/2016     Lab Results  Component Value Date   HGBA1C 5.4 12/16/2016    Lab Results  Component Value Date   TSH 47.074 (H) 12/16/2016     Lab Results  Component Value Date   AST 19 12/16/2016   AST 21 12/16/2016   Lab Results  Component Value Date   ALT 14 12/16/2016   ALT 14 12/16/2016     Urinalysis > 30 WBC's. Moderate bacteria.  See EPIC.   Pertinent Imaging: CLINICAL DATA:  Lower abdominal pain with diarrhea and constipation.  EXAM: CT ABDOMEN AND PELVIS WITH CONTRAST  TECHNIQUE: Multidetector CT imaging of the abdomen and pelvis was performed using the standard protocol following bolus administration of intravenous contrast.  CONTRAST:  166mL ISOVUE-300 IOPAMIDOL (ISOVUE-300) INJECTION 61%  COMPARISON:  None.  FINDINGS: Lower chest: Partly seen right mastectomy site. Trace right pleural fluid.  Hepatobiliary: No focal liver abnormality.No evidence of biliary obstruction or  stone.  Pancreas: Unremarkable.  Spleen: Unremarkable.  Adrenals/Urinary Tract: Negative adrenals. No hydronephrosis or stone. No bladder inflammation noted. Prominent density of the base of the bladder could be vessels or from continence surgery.  Stomach/Bowel: Numerous colonic diverticula without evidence of diverticulitis. No bowel obstruction. No rectal impaction. No appendicitis.  Vascular/Lymphatic: No acute vascular abnormality.  No mass or adenopathy.  Reproductive:Hysterectomy with negative adnexa. Pelvic floor laxity. Mild prominence of gastric antral wall thickness but not definitive for antritis.  Other: No ascites or pneumoperitoneum.  Musculoskeletal: No acute abnormalities. Remote left obturator ring fractures. Mildly expansile sacral Tarlov cysts, incidental. Diffuse degenerative disc narrowing.  IMPRESSION: 1. No acute finding to explain abdominal pain. 2. Extensive colonic diverticulosis.   Electronically Signed   By: Monte Fantasia M.D.   On: 11/24/2016 15:55  Assessment & Plan:    1. LU TS  - I have only one documented UTI available to me at this visit, so patient does not meet criteria for recurrent UTI's at this time  - Patient is instructed to increase her water intake until the urine is pale yellow or clear.  I have advised her to take probiotics (yogurt, oral pills or vaginal suppositories), take cranberry pills or drink the juice.  She is to take Vitamin C 1,000 mg daily to acidify the urine.  She should also avoid soaking in tubs and wipe front to back after urinating.  She may benefit from core strengthening exercises.  We can refer her to PT if she desires.    - Because of her history of recurrent UTI's, I have asked the patient to contact our office if she should experience symptoms of urinary tract infection so that we can CATH her for an urine specimen for urinalysis and culture. This is to prevent a skin contaminant from showing up  in the urine culture.  If she should have her symptoms after hours or cannot get to our office, she should notify her other providers that she needs a catheterized specimen for UA and culture.   - I reviewed the symptoms of a urinary tract infection, such as a worsening of urinary urgency and frequency, dysuria, which is painful urination and not the pain of urine hitting sensitive perineal skin, hematuria, foul-smelling urine, suprapubic pain or mental status changes. Fevers, chills, nausea and or vomiting can also be signs of a possible UTI.  Positive urinalyses and positive urine cultures that are not associated with urinary symptoms should not be treated with antibiotics.    - I explained to the patient that being exposed to unnecessary antibiotics can put her at risk for increasing resistance of the bacteria to antibiotics, C. difficile and the side effects of the antibiotics.    - CATH UA  - urine sent for culture   2. Vaginal atrophy  - I explained to the patient that when women go through menopause and her estrogen levels are severely diminished, the normal vaginal flora will change.  This is due to an increase of the vaginal canal's pH. Because of this, the vaginal canal may be colonized by bacteria from the rectum instead of the protective lactobacillus.  This accompanied by the loss of the mucus barrier with vaginal atrophy is a cause of recurrent urinary tract infections.  - Patient is not a candidate for vaginal estrogen cream due to her history of breast cancer    - advised patient to use vaginal probiotics  - likely the cause of her "burning"- advised the use of olive oil or coconut oil for discomfort  3. Incontinence  - managed with pull ups                          Return in about 3 months (around 04/03/2017) for PVR and OAB questionnaire.  These notes generated with  voice recognition software. I apologize for typographical errors.  Zara Council, [facility] [address] [place], [place] [address] [phone]

## 2017-01-03 NOTE — Progress Notes (Signed)
In and Out Catheterization  Patient is present today for a I & O catheterization due to recurrent uti. Patient was cleaned and prepped in a sterile fashion with betadine and Lidocaine 2% jelly was instilled into the urethra.  A 14FR cath was inserted no complications were noted , 250ml of urine return was noted, urine was cloudy and dark yellow in color. A clean urine sample was collected for Urinalysis. Bladder was drained and catheter was removed with out difficulty.    Preformed by: Lyndee Hensen CMA

## 2017-01-03 NOTE — Patient Instructions (Signed)

## 2017-01-06 ENCOUNTER — Ambulatory Visit: Payer: Self-pay | Admitting: Urology

## 2017-01-08 LAB — CULTURE, URINE COMPREHENSIVE

## 2017-01-09 ENCOUNTER — Telehealth: Payer: Self-pay

## 2017-01-09 DIAGNOSIS — N390 Urinary tract infection, site not specified: Secondary | ICD-10-CM

## 2017-01-09 MED ORDER — SULFAMETHOXAZOLE-TRIMETHOPRIM 800-160 MG PO TABS: 1.0000 | ORAL_TABLET | Freq: Two times a day (BID) | ORAL | 0 refills | Status: AC

## 2017-01-09 NOTE — Telephone Encounter (Signed)
Spoke with pt caregiver in reference to +ucx. Made aware abx were sent to pharmacy. Caregiver voiced understanding.

## 2017-01-09 NOTE — Telephone Encounter (Signed)
-----   Message from Nori Riis, PA-C sent at 01/08/2017  3:49 PM EST ----- Patient has a +UCx.  They need to start Septra DS, one tablet twice daily for seven days.  They also need to take a probiotic with the antibiotic course.  The dosage is listed below:  L. acidophilus and L. casei (25 x 109 CFU/day for 2 days, then 50 x 109 CFU/day for duration of the antibiotic course)

## 2017-01-19 ENCOUNTER — Ambulatory Visit (INDEPENDENT_AMBULATORY_CARE_PROVIDER_SITE_OTHER): Payer: Medicare Other

## 2017-01-19 VITALS — BP 153/76 | HR 72 | Ht 60.0 in | Wt 132.9 lb

## 2017-01-19 DIAGNOSIS — N390 Urinary tract infection, site not specified: Secondary | ICD-10-CM

## 2017-01-19 NOTE — Progress Notes (Signed)
Pt presents today with c/o dysuria and lower abd pain. A CATH specimen was obtained for u/a and cx. Pt has been on abx in the last 30 days.  Blood pressure (!) 153/76, pulse 72, height 5' (1.524 m), weight 132 lb 14.4 oz (60.3 kg).

## 2017-01-20 LAB — URINALYSIS, COMPLETE
BILIRUBIN UA: NEGATIVE
GLUCOSE, UA: NEGATIVE
Ketones, UA: NEGATIVE
Nitrite, UA: POSITIVE — AB
PH UA: 6 (ref 5.0–7.5)
PROTEIN UA: NEGATIVE
Specific Gravity, UA: 1.015 (ref 1.005–1.030)
UUROB: 0.2 mg/dL (ref 0.2–1.0)

## 2017-01-20 LAB — MICROSCOPIC EXAMINATION: RBC, UA: NONE SEEN /hpf (ref 0–?)

## 2017-01-21 LAB — CULTURE, URINE COMPREHENSIVE

## 2017-01-23 ENCOUNTER — Telehealth: Payer: Self-pay

## 2017-01-23 DIAGNOSIS — N390 Urinary tract infection, site not specified: Secondary | ICD-10-CM

## 2017-01-23 NOTE — Telephone Encounter (Signed)
Spoke with pt caregiver, Julia Alvarado, who was unable to tell me what reaction pt had to macrobid. Julia Alvarado stated that she had the reaction when in the hospital last. Julia Alvarado also requested pt have PICC line placed and home health come out and administer medication. Please advise.

## 2017-01-23 NOTE — Telephone Encounter (Signed)
-----   Message from Nori Riis, PA-C sent at 01/22/2017  9:26 PM EST ----- The only oral antibiotic that this bacteria is sensitive to is nitrofurantoin. She has it listed and her allergies, but there is no description as to what type of reaction she has with the nitrofurantoin. Please call the patient to clarify what her allergy is to the antibiotic, nitrofurantoin.

## 2017-01-23 NOTE — Telephone Encounter (Signed)
She needs a referral to ID for PICC line and IV antibiotics as we do not manage IV antibiotics.

## 2017-01-23 NOTE — Telephone Encounter (Signed)
Spoke with receptionist of facility. Receptionist is going to have caregiver call back.

## 2017-01-23 NOTE — Telephone Encounter (Signed)
Referral to ID was placed

## 2017-01-24 ENCOUNTER — Telehealth: Payer: Self-pay | Admitting: Urology

## 2017-01-24 NOTE — Telephone Encounter (Signed)
FAXED REFERRAL TO [facility] OFFICE AT [phone] THEY WILL CONTACT THE PATIENT WITH AN APP   Julia Alvarado

## 2017-02-03 ENCOUNTER — Encounter: Payer: Self-pay | Admitting: Emergency Medicine

## 2017-02-03 ENCOUNTER — Inpatient Hospital Stay
Admission: EM | Admit: 2017-02-03 | Discharge: 2017-02-04 | DRG: 690 | Disposition: A | Discharge status: Home Health | Payer: Medicare Other | Attending: Internal Medicine | Admitting: Internal Medicine

## 2017-02-03 DIAGNOSIS — R6510 Systemic inflammatory response syndrome (SIRS) of non-infectious origin without acute organ dysfunction: Secondary | ICD-10-CM | POA: Diagnosis present

## 2017-02-03 DIAGNOSIS — E785 Hyperlipidemia, unspecified: Secondary | ICD-10-CM | POA: Diagnosis present

## 2017-02-03 DIAGNOSIS — Z1612 Extended spectrum beta lactamase (ESBL) resistance: Secondary | ICD-10-CM | POA: Diagnosis present

## 2017-02-03 DIAGNOSIS — E039 Hypothyroidism, unspecified: Secondary | ICD-10-CM | POA: Diagnosis not present

## 2017-02-03 DIAGNOSIS — Z961 Presence of intraocular lens: Secondary | ICD-10-CM | POA: Diagnosis present

## 2017-02-03 DIAGNOSIS — Z791 Long term (current) use of non-steroidal anti-inflammatories (NSAID): Secondary | ICD-10-CM

## 2017-02-03 DIAGNOSIS — K219 Gastro-esophageal reflux disease without esophagitis: Secondary | ICD-10-CM | POA: Diagnosis present

## 2017-02-03 DIAGNOSIS — Z8249 Family history of ischemic heart disease and other diseases of the circulatory system: Secondary | ICD-10-CM | POA: Diagnosis not present

## 2017-02-03 DIAGNOSIS — G3183 Dementia with Lewy bodies: Secondary | ICD-10-CM | POA: Diagnosis present

## 2017-02-03 DIAGNOSIS — Z853 Personal history of malignant neoplasm of breast: Secondary | ICD-10-CM

## 2017-02-03 DIAGNOSIS — Z90710 Acquired absence of both cervix and uterus: Secondary | ICD-10-CM | POA: Diagnosis not present

## 2017-02-03 DIAGNOSIS — Z87440 Personal history of urinary (tract) infections: Secondary | ICD-10-CM | POA: Diagnosis not present

## 2017-02-03 DIAGNOSIS — Z91040 Latex allergy status: Secondary | ICD-10-CM

## 2017-02-03 DIAGNOSIS — Z79899 Other long term (current) drug therapy: Secondary | ICD-10-CM

## 2017-02-03 DIAGNOSIS — N390 Urinary tract infection, site not specified: Secondary | ICD-10-CM

## 2017-02-03 DIAGNOSIS — I10 Essential (primary) hypertension: Secondary | ICD-10-CM | POA: Diagnosis not present

## 2017-02-03 DIAGNOSIS — R1310 Dysphagia, unspecified: Secondary | ICD-10-CM | POA: Diagnosis present

## 2017-02-03 DIAGNOSIS — Z9841 Cataract extraction status, right eye: Secondary | ICD-10-CM | POA: Diagnosis not present

## 2017-02-03 DIAGNOSIS — F0280 Dementia in other diseases classified elsewhere without behavioral disturbance: Secondary | ICD-10-CM | POA: Diagnosis present

## 2017-02-03 DIAGNOSIS — Z885 Allergy status to narcotic agent status: Secondary | ICD-10-CM

## 2017-02-03 DIAGNOSIS — H409 Unspecified glaucoma: Secondary | ICD-10-CM | POA: Diagnosis present

## 2017-02-03 DIAGNOSIS — R609 Edema, unspecified: Secondary | ICD-10-CM

## 2017-02-03 DIAGNOSIS — B9620 Unspecified Escherichia coli [E. coli] as the cause of diseases classified elsewhere: Secondary | ICD-10-CM | POA: Diagnosis present

## 2017-02-03 DIAGNOSIS — A499 Bacterial infection, unspecified: Secondary | ICD-10-CM

## 2017-02-03 DIAGNOSIS — Z888 Allergy status to other drugs, medicaments and biological substances status: Secondary | ICD-10-CM

## 2017-02-03 DIAGNOSIS — I639 Cerebral infarction, unspecified: Secondary | ICD-10-CM

## 2017-02-03 DIAGNOSIS — Z9011 Acquired absence of right breast and nipple: Secondary | ICD-10-CM

## 2017-02-03 DIAGNOSIS — R4182 Altered mental status, unspecified: Secondary | ICD-10-CM

## 2017-02-03 LAB — URINALYSIS, COMPLETE (UACMP) WITH MICROSCOPIC
Bilirubin Urine: NEGATIVE
GLUCOSE, UA: NEGATIVE mg/dL
HGB URINE DIPSTICK: NEGATIVE
KETONES UR: NEGATIVE mg/dL
Nitrite: POSITIVE — AB
PROTEIN: NEGATIVE mg/dL
RBC / HPF: NONE SEEN RBC/hpf (ref 0–5)
Specific Gravity, Urine: 1.01 (ref 1.005–1.030)
pH: 6 (ref 5.0–8.0)

## 2017-02-03 LAB — CBC WITH DIFFERENTIAL/PLATELET
BASOS PCT: 1 %
Basophils Absolute: 0.1 10*3/uL (ref 0–0.1)
EOS ABS: 0.1 10*3/uL (ref 0–0.7)
EOS PCT: 1 %
HCT: 32.6 % — ABNORMAL LOW (ref 35.0–47.0)
Hemoglobin: 11.4 g/dL — ABNORMAL LOW (ref 12.0–16.0)
LYMPHS PCT: 18 %
Lymphs Abs: 1.2 10*3/uL (ref 1.0–3.6)
MCH: 31.9 pg (ref 26.0–34.0)
MCHC: 34.9 g/dL (ref 32.0–36.0)
MCV: 91.4 fL (ref 80.0–100.0)
Monocytes Absolute: 0.7 10*3/uL (ref 0.2–0.9)
Monocytes Relative: 11 %
Neutro Abs: 4.4 10*3/uL (ref 1.4–6.5)
Neutrophils Relative %: 69 %
PLATELETS: 319 10*3/uL (ref 150–440)
RBC: 3.57 MIL/uL — AB (ref 3.80–5.20)
RDW: 14.4 % (ref 11.5–14.5)
WBC: 6.4 10*3/uL (ref 3.6–11.0)

## 2017-02-03 LAB — COMPREHENSIVE METABOLIC PANEL
ALBUMIN: 3.9 g/dL (ref 3.5–5.0)
ALT: 11 U/L — AB (ref 14–54)
AST: 19 U/L (ref 15–41)
Alkaline Phosphatase: 86 U/L (ref 38–126)
Anion gap: 11 (ref 5–15)
BUN: 8 mg/dL (ref 6–20)
CHLORIDE: 94 mmol/L — AB (ref 101–111)
CO2: 25 mmol/L (ref 22–32)
CREATININE: 0.74 mg/dL (ref 0.44–1.00)
Calcium: 9 mg/dL (ref 8.9–10.3)
GFR calc Af Amer: >60 mL/min (ref >60)
GFR calc non Af Amer: >60 mL/min (ref >60)
Glucose, Bld: 115 mg/dL — ABNORMAL HIGH (ref 65–99)
POTASSIUM: 3.5 mmol/L (ref 3.5–5.1)
SODIUM: 130 mmol/L — AB (ref 135–145)
Total Bilirubin: 0.4 mg/dL (ref 0.3–1.2)
Total Protein: 6.8 g/dL (ref 6.5–8.1)

## 2017-02-03 LAB — TROPONIN I

## 2017-02-03 MED ORDER — HEPARIN SODIUM (PORCINE) 5000 UNIT/ML IJ SOLN
5000.0000 [IU] | Freq: Three times a day (TID) | INTRAMUSCULAR | Status: DC
  Administered 2017-02-04 (×3): 5000 [IU] via SUBCUTANEOUS
  Filled 2017-02-03 (×3): qty 1

## 2017-02-03 MED ORDER — LATANOPROST 0.005 % OP SOLN
1.0000 [drp] | Freq: Every day | OPHTHALMIC | Status: DC
  Filled 2017-02-03 (×2): qty 2.5

## 2017-02-03 MED ORDER — MEMANTINE HCL 5 MG PO TABS
5.0000 mg | ORAL_TABLET | Freq: Two times a day (BID) | ORAL | Status: DC
  Administered 2017-02-03 – 2017-02-04 (×2): 5 mg via ORAL
  Filled 2017-02-03 (×2): qty 1

## 2017-02-03 MED ORDER — VITAMIN C 500 MG PO TABS
500.0000 mg | ORAL_TABLET | Freq: Every day | ORAL | Status: DC
  Administered 2017-02-04: 500 mg via ORAL
  Filled 2017-02-03: qty 1

## 2017-02-03 MED ORDER — POLYVINYL ALCOHOL 1.4 % OP SOLN
1.0000 [drp] | OPHTHALMIC | Status: DC | PRN
  Filled 2017-02-03: qty 15

## 2017-02-03 MED ORDER — DORZOLAMIDE HCL-TIMOLOL MAL 2-0.5 % OP SOLN: 1.0000 [drp] | Freq: Two times a day (BID) | OPHTHALMIC | Status: DC

## 2017-02-03 MED ORDER — TRAZODONE HCL 50 MG PO TABS
25.0000 mg | ORAL_TABLET | Freq: Every day | ORAL | Status: DC
  Administered 2017-02-03: 25 mg via ORAL
  Filled 2017-02-03: qty 1

## 2017-02-03 MED ORDER — RISAQUAD PO CAPS
2.0000 | ORAL_CAPSULE | Freq: Every day | ORAL | Status: DC
  Administered 2017-02-03 – 2017-02-04 (×2): 2 via ORAL
  Filled 2017-02-03 (×2): qty 2

## 2017-02-03 MED ORDER — OMEGA-3-ACID ETHYL ESTERS 1 G PO CAPS
1.0000 g | ORAL_CAPSULE | Freq: Every day | ORAL | Status: DC
  Administered 2017-02-04: 1 g via ORAL
  Filled 2017-02-03: qty 1

## 2017-02-03 MED ORDER — IBUPROFEN 400 MG PO TABS
400.0000 mg | ORAL_TABLET | Freq: Three times a day (TID) | ORAL | Status: DC
  Administered 2017-02-03 – 2017-02-04 (×3): 400 mg via ORAL
  Filled 2017-02-03 (×4): qty 1

## 2017-02-03 MED ORDER — MEROPENEM-SODIUM CHLORIDE 500 MG/50ML IV SOLR
500.0000 mg | Freq: Three times a day (TID) | INTRAVENOUS | Status: DC
  Administered 2017-02-04 (×3): 500 mg via INTRAVENOUS
  Filled 2017-02-03 (×7): qty 50

## 2017-02-03 MED ORDER — NITROFURANTOIN MONOHYD MACRO 100 MG PO CAPS
100.0000 mg | ORAL_CAPSULE | Freq: Two times a day (BID) | ORAL | Status: DC
  Administered 2017-02-03: 100 mg via ORAL
  Filled 2017-02-03: qty 1

## 2017-02-03 MED ORDER — DORZOLAMIDE HCL-TIMOLOL MAL PF 22.3-6.8 MG/ML OP SOLN
1.0000 [drp] | Freq: Two times a day (BID) | OPHTHALMIC | Status: DC
  Administered 2017-02-04: 1 [drp] via OPHTHALMIC
  Filled 2017-02-03 (×4): qty 1

## 2017-02-03 MED ORDER — CITALOPRAM HYDROBROMIDE 20 MG PO TABS
20.0000 mg | ORAL_TABLET | Freq: Every day | ORAL | Status: DC
  Administered 2017-02-04: 20 mg via ORAL
  Filled 2017-02-03: qty 1

## 2017-02-03 MED ORDER — RISPERIDONE 1 MG PO TABS
0.5000 mg | ORAL_TABLET | Freq: Two times a day (BID) | ORAL | Status: DC
  Administered 2017-02-03 – 2017-02-04 (×2): 0.5 mg via ORAL
  Filled 2017-02-03 (×3): qty 1

## 2017-02-03 MED ORDER — HALOPERIDOL LACTATE 5 MG/ML IJ SOLN
5.0000 mg | Freq: Once | INTRAMUSCULAR | Status: AC
  Administered 2017-02-03: 5 mg via INTRAMUSCULAR
  Filled 2017-02-03: qty 1

## 2017-02-03 MED ORDER — SALINE SPRAY 0.65 % NA SOLN
1.0000 | Freq: Every day | NASAL | Status: DC
  Administered 2017-02-04 (×2): 1 via NASAL
  Filled 2017-02-03 (×2): qty 44

## 2017-02-03 MED ORDER — LEVOTHYROXINE SODIUM 112 MCG PO TABS
112.0000 ug | ORAL_TABLET | Freq: Every day | ORAL | Status: DC
  Administered 2017-02-04: 112 ug via ORAL
  Filled 2017-02-03: qty 1

## 2017-02-03 MED ORDER — GLUCOSAMINE-CHONDROITIN 500-400 MG PO TABS: 1.0000 | ORAL_TABLET | Freq: Three times a day (TID) | ORAL | Status: DC

## 2017-02-03 MED ORDER — SODIUM CHLORIDE 0.9 % IV SOLN: 1.0000 g | Freq: Three times a day (TID) | INTRAVENOUS | Status: DC

## 2017-02-03 MED ORDER — MELATONIN 5 MG PO TABS
5.0000 mg | ORAL_TABLET | Freq: Every day | ORAL | Status: DC
  Filled 2017-02-03 (×2): qty 1

## 2017-02-03 MED ORDER — VITAMIN B-12 1000 MCG PO TABS
1000.0000 ug | ORAL_TABLET | Freq: Every day | ORAL | Status: DC
  Administered 2017-02-04: 1000 ug via ORAL
  Filled 2017-02-03: qty 1

## 2017-02-03 MED ORDER — MEROPENEM-SODIUM CHLORIDE 1 GM/50ML IV SOLR
1.0000 g | Freq: Once | INTRAVENOUS | Status: AC
Start: 2017-02-03 — End: 2017-02-03
  Administered 2017-02-03: 1 g via INTRAVENOUS
  Filled 2017-02-03: qty 50

## 2017-02-03 MED ORDER — DOCUSATE SODIUM 100 MG PO CAPS
100.0000 mg | ORAL_CAPSULE | Freq: Two times a day (BID) | ORAL | Status: DC | PRN
  Administered 2017-02-04: 100 mg via ORAL
  Filled 2017-02-03: qty 1

## 2017-02-03 MED ORDER — LORATADINE 10 MG PO TABS
10.0000 mg | ORAL_TABLET | Freq: Every day | ORAL | Status: DC
  Administered 2017-02-04: 10 mg via ORAL
  Filled 2017-02-03: qty 1

## 2017-02-03 MED ORDER — DONEPEZIL HCL 5 MG PO TABS
5.0000 mg | ORAL_TABLET | Freq: Every day | ORAL | Status: DC
  Filled 2017-02-03 (×3): qty 1

## 2017-02-03 NOTE — Progress Notes (Signed)
PHARMACIST - PHYSICIAN ORDER COMMUNICATION  CONCERNING: P&T Medication Policy on Herbal Medications  DESCRIPTION:  This patient's order for:  Glucosamine-chondroitin  has been noted.  This product(s) is classified as an "herbal" or natural product. Due to a lack of definitive safety studies or FDA approval, nonstandard manufacturing practices, plus the potential risk of unknown drug-drug interactions while on inpatient medications, the Pharmacy and Therapeutics Committee does not permit the use of "herbal" or natural products of this type within [place].   ACTION TAKEN: The pharmacy department is unable to verify this order at this time and your patient has been informed of this safety policy. Please reevaluate patient's clinical condition at discharge and address if the herbal or natural product(s) should be resumed at that time.   

## 2017-02-03 NOTE — ED Triage Notes (Signed)
[facility] ([address]  Staff states that patient was diagnosed with a UTI 2 weeks ago and was not placed on abx due to "wide spread resistance" per PCP. Patient has appt with infectious disease for port and IV ABX. Patient arrived acutely agitated and altered above baseline

## 2017-02-03 NOTE — H&P (Signed)
[facility] NAME: Julia Alvarado    MR#:  [id number]  DATE OF BIRTH:  11/30/1941  DATE OF ADMISSION:  02/03/2017  PRIMARY CARE PHYSICIAN: Valera Castle, MD   REQUESTING/REFERRING PHYSICIAN: Archie Balboa  CHIEF COMPLAINT:   Chief Complaint  Patient presents with  . Altered Mental Status    HISTORY OF PRESENT ILLNESS: Julia Alvarado  is a 76 y.o. female with a known history of Arthritis, breast cancer, gastroesophageal reflux disease, hyperlipidemia, hypertension, hypothyroidism, sacral fracture- lives in a nursing home because of her dementia, was treated for UTI over there for last 1 or 2 weeks initially with oral antibiotic but when the culture result shows the bacteria are resistant to most of the antibiotics date scheduled her to see infectious disease specialist in the clinic and that was set up for next week. But today patient was noted to have more confusion and had some fever so she was sent to emergency room. She was noted to have UTI and as per review of her previous urine culture result 2 weeks ago show she has resistant Escherichia coli which is only sensitive to ertapenem , imipenem and nitrofurantoin so ER physician started on IV Meropenem. PAST MEDICAL HISTORY:   Past Medical History:  Diagnosis Date  . Arthritis   . Breast cancer ([place])   . Breast cancer ([place])   . GERD (gastroesophageal reflux disease)   . Hyperlipemia   . Hypertension   . Hypothyroidism   . Lewy body dementia without behavioral disturbance   . Sacral fracture ([place]) 1/15    PAST SURGICAL HISTORY: Past Surgical History:  Procedure Laterality Date  . ABDOMINAL HYSTERECTOMY    . CATARACT EXTRACTION W/ INTRAOCULAR LENS IMPLANT Right   . CATARACT EXTRACTION W/PHACO Right 06/15/2016   Procedure: CATARACT EXTRACTION PHACO AND INTRAOCULAR LENS PLACEMENT (IOC);  Surgeon: Leandrew Koyanagi, MD;  Location: [place];  Service: Ophthalmology;   Laterality: Right;  . MASTECTOMY Right ~2005  . MASTECTOMY    . RETINAL DETACHMENT REPAIR W/ SCLERAL BUCKLE LE      SOCIAL HISTORY:  Social History  Substance Use Topics  . Smoking status: Never Smoker  . Smokeless tobacco: Never Used  . Alcohol use No    FAMILY HISTORY:  Family History  Problem Relation Age of Onset  . Heart disease Father   . Cancer Brother     all except 1 died of cancer    DRUG ALLERGIES:  Allergies  Allergen Reactions  . Cefdinir Other (See Comments)    Liquid form causes oral thrush  . Codeine Nausea And Vomiting    nausea  . Ibandronic Acid Nausea Only  . Latex Hives  . Nitrofurantoin     Other reaction(s): Vomiting  . Raloxifene Other (See Comments)    Per pt, "eye doctor said I cannot take" Per pt, "eye doctor said I cannot take" Per pt, "eye doctor said I cannot take"    REVIEW OF SYSTEMS:   Because of dementia patient is not able to provide review of system, history obtained from her son and daughter who were present in the room during my visit.  MEDICATIONS AT HOME:  Prior to Admission medications   Medication Sig Start Date End Date Taking? Authorizing Provider  citalopram (CELEXA) 20 MG tablet Take 20 mg by mouth daily.    Historical Provider, MD  donepezil (ARICEPT) 5 MG tablet Take 5 mg by mouth at bedtime.    Historical  Provider, MD  dorzolamide-timolol (COSOPT) 22.3-6.8 MG/ML ophthalmic solution Place 1 drop into the right eye 2 (two) times daily.    Historical Provider, MD  fluconazole (DIFLUCAN) 150 MG tablet 1 tab po today and 1 tab in three days. 12/21/16   Historical Provider, MD  fluticasone (FLONASE) 50 MCG/ACT nasal spray Place into both nostrils daily.    Historical Provider, MD  glucosamine-chondroitin (GLUCOSAMINE-CHONDROITIN DS) 500-400 MG tablet Take 1 tablet by mouth 3 (three) times daily. Reported on 06/09/2016    Historical Provider, MD  hydroxypropyl methylcellulose / hypromellose (ISOPTO TEARS / GONIOVISC) 2.5 %  ophthalmic solution Place 1 drop into both eyes as needed for dry eyes.     Historical Provider, MD  ibuprofen (ADVIL,MOTRIN) 400 MG tablet Take 400 mg by mouth 3 (three) times daily. Reported on 06/09/2016    Historical Provider, MD  latanoprost (XALATAN) 0.005 % ophthalmic solution Place 1 drop into both eyes at bedtime. Reported on 06/09/2016    Historical Provider, MD  levothyroxine (SYNTHROID, LEVOTHROID) 112 MCG tablet Take 1 tablet (112 mcg total) by mouth daily before breakfast. Reported on 06/09/2016 12/18/16   Lytle Butte, MD  loratadine (CLARITIN) 10 MG tablet Take 10 mg by mouth daily.    Historical Provider, MD  Melatonin 3 MG CAPS Take 1 capsule by mouth at bedtime. Reported on 06/09/2016    Historical Provider, MD  memantine (NAMENDA) 5 MG tablet Take 5 mg by mouth 2 (two) times daily.    Historical Provider, MD  Omega-3 Fatty Acids (FISH OIL) 1000 MG CAPS Take 1 capsule by mouth daily. Reported on 06/09/2016    Historical Provider, MD  risperiDONE (RISPERDAL) 0.25 MG tablet Take 0.5 mg by mouth 2 (two) times daily.     Historical Provider, MD  sodium chloride (OCEAN) 0.65 % SOLN nasal spray Place 1 spray into both nostrils 5 (five) times daily. Reported on 06/09/2016    Historical Provider, MD  traZODone (DESYREL) 50 MG tablet Take 25 mg by mouth at bedtime.     Historical Provider, MD  vitamin B-12 (CYANOCOBALAMIN) 1000 MCG tablet Take 1,000 mcg by mouth daily.    Historical Provider, MD  vitamin C (ASCORBIC ACID) 500 MG tablet Take 1 tablet (500 mg total) by mouth daily. 01/03/17   Shannon A McGowan, PA-C      PHYSICAL EXAMINATION:   VITAL SIGNS: Pulse (!) 110, temperature 98.6 F (37 C), resp. rate 19, height 5' (1.524 m), weight 60.3 kg (132 lb 15 oz), SpO2 100 %.  GENERAL:  76 y.o.-year-old patient lying in the bed with no acute distress.  EYES: Pupils equal, round, reactive to light and accommodation. No scleral icterus. Extraocular muscles intact.  HEENT: Head atraumatic,  normocephalic. Oropharynx and nasopharynx clear.  NECK:  Supple, no jugular venous distention. No thyroid enlargement, no tenderness.  LUNGS: Normal breath sounds bilaterally, no wheezing, rales,rhonchi or crepitation. No use of accessory muscles of respiration.  CARDIOVASCULAR: S1, S2 normal. No murmurs, rubs, or gallops.  ABDOMEN: Soft, nontender, nondistended. Bowel sounds present. No organomegaly or mass.  EXTREMITIES: No pedal edema, cyanosis, or clubbing.  NEUROLOGIC: Cranial nerves II through XII are intact. Muscle strength 3-4/5 in all extremities. Sensation intact. Gait not checked.  PSYCHIATRIC: The patient is alert and oriented x 1.  SKIN: No obvious rash, lesion, or ulcer.   LABORATORY PANEL:   CBC  Recent Labs Lab 02/03/17 1538  WBC 6.4  HGB 11.4*  HCT 32.6*  PLT 319  MCV 91.4  MCH 31.9  MCHC 34.9  RDW 14.4  LYMPHSABS 1.2  MONOABS 0.7  EOSABS 0.1  BASOSABS 0.1   ------------------------------------------------------------------------------------------------------------------  Chemistries   Recent Labs Lab 02/03/17 1538  NA 130*  K 3.5  CL 94*  CO2 25  GLUCOSE 115*  BUN 8  CREATININE 0.74  CALCIUM 9.0  AST 19  ALT 11*  ALKPHOS 86  BILITOT 0.4   ------------------------------------------------------------------------------------------------------------------ estimated creatinine clearance is 50.1 mL/min (by C-G formula based on SCr of 0.74 mg/dL). ------------------------------------------------------------------------------------------------------------------ No results for input(s): TSH, T4TOTAL, T3FREE, THYROIDAB in the last 72 hours.  Invalid input(s): FREET3   Coagulation profile No results for input(s): INR, PROTIME in the last 168 hours. ------------------------------------------------------------------------------------------------------------------- No results for input(s): DDIMER in the last 72  hours. -------------------------------------------------------------------------------------------------------------------  Cardiac Enzymes No results for input(s): CKMB, TROPONINI, MYOGLOBIN in the last 168 hours.  Invalid input(s): CK ------------------------------------------------------------------------------------------------------------------ Invalid input(s): POCBNP  ---------------------------------------------------------------------------------------------------------------  Urinalysis    Component Value Date/Time   COLORURINE YELLOW (A) 02/03/2017 1538   APPEARANCEUR HAZY (A) 02/03/2017 1538   APPEARANCEUR Cloudy (A) 01/19/2017 1616   LABSPEC 1.010 02/03/2017 1538   LABSPEC 1.016 01/01/2014 1148   PHURINE 6.0 02/03/2017 1538   GLUCOSEU NEGATIVE 02/03/2017 1538   GLUCOSEU Negative 01/01/2014 1148   HGBUR NEGATIVE 02/03/2017 1538   BILIRUBINUR NEGATIVE 02/03/2017 1538   BILIRUBINUR Negative 01/19/2017 1616   BILIRUBINUR Negative 01/01/2014 1148   KETONESUR NEGATIVE 02/03/2017 1538   PROTEINUR NEGATIVE 02/03/2017 1538   NITRITE POSITIVE (A) 02/03/2017 1538   LEUKOCYTESUR MODERATE (A) 02/03/2017 1538   LEUKOCYTESUR 3+ (A) 01/19/2017 1616   LEUKOCYTESUR Negative 01/01/2014 1148     RADIOLOGY: No results found.  EKG: Orders placed or performed during the hospital encounter of 02/03/17  . ED EKG  . ED EKG  . EKG 12-Lead  . EKG 12-Lead    IMPRESSION AND PLAN:  * UTI   Altered mental status and drowsiness    She was treated with oral therapy at nursing home but all cultures should found to have a resistant organism.   As per the culture and sensitivity result start on IV meropenem and we may help to arrange to give 10 days IV course antibiotics which can be arranged at her nursing home with help of social worker.  * Glaucoma   Continue eye drops.  * Hypothyroidism   Continue levothyroxine.  * Hyperlipidemia   Cont Omega 3 fatty acid.  All the records  are reviewed and case discussed with ED provider. Management plans discussed with the patient, family and they are in agreement.  CODE STATUS: Full code. Code Status History    Date Active Date Inactive Code Status Order ID Comments User Context   12/17/2016  5:13 AM 12/18/2016  3:43 PM Full Code [id number]  Harrie Foreman, MD Inpatient     Patient's son and daughter were present in the room during my visit.  TOTAL TIME TAKING CARE OF THIS PATIENT: 50 minutes.    Vaughan Basta M.D on 02/03/2017   Between 7am to 6pm - Pager - [phone]  After 6pm go to [email or web address] - password [email or web address] [facility]  Office  [phone]  CC: Primary care physician; Valera Castle, MD   Note: This dictation was prepared with Dragon dictation along with smaller phrase technology. Any transcriptional errors that result from this process are unintentional.

## 2017-02-03 NOTE — Progress Notes (Signed)
Patient wears dentures upper and lower. Facility delivered dentures and only the top dentures were in the denture case

## 2017-02-03 NOTE — Progress Notes (Signed)
Family Meeting Note  Advance Directive:yes  Today a meeting took place with the son and daughter.  Patient is unable to participate due [id number] capacity dementia   The following clinical team members were present during this meeting:MD  The following were discussed:Patient's diagnosis: dementia , Patient's progosis: Unable to determine and Goals for treatment: Continue present management  Additional follow-up to be provided: PMD  Time spent during discussion:20 minutes  Julia Alvarado, [name], MD

## 2017-02-03 NOTE — Progress Notes (Signed)
Pharmacy Antibiotic Note  Julia Alvarado is a 76 y.o. female admitted on 02/03/2017 with ESBL E. COlI  UTI.  Pharmacy has been consulted for meropenem dosing. Meropenem 1gm IV x 1 dose was given in ED.   Plan: Will start patient on meropenem 500mg  IV every 8 hours based on current CrCl of 50.33ml/min. Will monitor renal function and adjust dose as needed.   Height: 5' (152.4 cm) Weight: 132 lb 15 oz (60.3 kg) IBW/kg (Calculated) : 45.5  Temp (24hrs), Avg:98.6 F (37 C), Min:98.6 F (37 C), Max:98.6 F (37 C)   Recent Labs Lab 02/03/17 1538  WBC 6.4  CREATININE 0.74    Estimated Creatinine Clearance: 50.1 mL/min (by C-G formula based on SCr of 0.74 mg/dL).    Allergies  Allergen Reactions  . Cefdinir Other (See Comments)    Liquid form causes oral thrush  . Codeine Nausea And Vomiting    nausea  . Ibandronic Acid Nausea Only  . Latex Hives  . Nitrofurantoin     Other reaction(s): Vomiting  . Raloxifene Other (See Comments)    Per pt, "eye doctor said I cannot take" Per pt, "eye doctor said I cannot take" Per pt, "eye doctor said I cannot take"    Antimicrobials this admission: 2/26 meropenem >>   Dose adjustments this admission:  Microbiology results: 2/1 UCx: >100,000 E. Coli   Thank you for allowing pharmacy to be a part of this patient's care.  Pernell Dupre, PharmD, BCPS Clinical Pharmacist 02/03/2017 4:57 PM

## 2017-02-03 NOTE — ED Provider Notes (Signed)
[facility] Emergency Department Provider Note    First MD Initiated Contact with Patient 02/03/17 1533     (approximate)  I have reviewed the triage vital signs and the nursing notes.   HISTORY  Chief Complaint Altered Mental Status  Level V Caveat:  Severe demenita  HPI DEMII Alvarado is a 76 y.o. female with a history of Lou body dementia presents from nursing facility with reported concern for a refractory UTI. No other information available or sent by EMS. Patient unable to provide any history. Patient repeating herself. No evidence of trauma. According to EMS the patient was to have outpatient follow-up with infectious disease due to recurrent resistant urinary tract infections.   Past Medical History:  Diagnosis Date  . Arthritis   . Breast cancer ([place])   . Breast cancer ([place])   . GERD (gastroesophageal reflux disease)   . Hyperlipemia   . Hypertension   . Hypothyroidism   . Lewy body dementia without behavioral disturbance   . Sacral fracture ([place]) 1/15   Family History  Problem Relation Age of Onset  . Heart disease Father   . Cancer Brother     all except 1 died of cancer   Past Surgical History:  Procedure Laterality Date  . ABDOMINAL HYSTERECTOMY    . CATARACT EXTRACTION W/ INTRAOCULAR LENS IMPLANT Right   . CATARACT EXTRACTION W/PHACO Right 06/15/2016   Procedure: CATARACT EXTRACTION PHACO AND INTRAOCULAR LENS PLACEMENT (IOC);  Surgeon: Leandrew Koyanagi, MD;  Location: [facility];  Service: Ophthalmology;  Laterality: Right;  . MASTECTOMY Right ~2005  . MASTECTOMY    . RETINAL DETACHMENT REPAIR W/ SCLERAL BUCKLE LE     Patient Active Problem List   Diagnosis Date Noted  . 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 GERD (gastroesophageal reflux disease)   . Hypothyroidism   . Sacral fracture ([place])   . Lewy body dementia without behavioral disturbance   . Breast cancer ([place])       Prior to Admission medications   Medication  Sig Start Date End Date Taking? Authorizing Provider  citalopram (CELEXA) 20 MG tablet Take 20 mg by mouth daily.    Historical Provider, MD  donepezil (ARICEPT) 5 MG tablet Take 5 mg by mouth at bedtime.    Historical Provider, MD  dorzolamide-timolol (COSOPT) 22.3-6.8 MG/ML ophthalmic solution Place 1 drop into the right eye 2 (two) times daily.    Historical Provider, MD  fluconazole (DIFLUCAN) 150 MG tablet 1 tab po today and 1 tab in three days. 12/21/16   Historical Provider, MD  fluticasone (FLONASE) 50 MCG/ACT nasal spray Place into both nostrils daily.    Historical Provider, MD  glucosamine-chondroitin (GLUCOSAMINE-CHONDROITIN DS) 500-400 MG tablet Take 1 tablet by mouth 3 (three) times daily. Reported on 06/09/2016    Historical Provider, MD  hydroxypropyl methylcellulose / hypromellose (ISOPTO TEARS / GONIOVISC) 2.5 % ophthalmic solution Place 1 drop into both eyes as needed for dry eyes.     Historical Provider, MD  ibuprofen (ADVIL,MOTRIN) 400 MG tablet Take 400 mg by mouth 3 (three) times daily. Reported on 06/09/2016    Historical Provider, MD  latanoprost (XALATAN) 0.005 % ophthalmic solution Place 1 drop into both eyes at bedtime. Reported on 06/09/2016    Historical Provider, MD  levothyroxine (SYNTHROID, LEVOTHROID) 112 MCG tablet Take 1 tablet (112 mcg total) by mouth daily before breakfast. Reported on 06/09/2016 12/18/16   Lytle Butte, MD  loratadine (CLARITIN) 10 MG tablet Take 10 mg  by mouth daily.    Historical Provider, MD  Melatonin 3 MG CAPS Take 1 capsule by mouth at bedtime. Reported on 06/09/2016    Historical Provider, MD  memantine (NAMENDA) 5 MG tablet Take 5 mg by mouth 2 (two) times daily.    Historical Provider, MD  Omega-3 Fatty Acids (FISH OIL) 1000 MG CAPS Take 1 capsule by mouth daily. Reported on 06/09/2016    Historical Provider, MD  risperiDONE (RISPERDAL) 0.25 MG tablet Take 0.5 mg by mouth 2 (two) times daily.     Historical Provider, MD  sodium chloride  (OCEAN) 0.65 % SOLN nasal spray Place 1 spray into both nostrils 5 (five) times daily. Reported on 06/09/2016    Historical Provider, MD  traZODone (DESYREL) 50 MG tablet Take 25 mg by mouth at bedtime.     Historical Provider, MD  vitamin B-12 (CYANOCOBALAMIN) 1000 MCG tablet Take 1,000 mcg by mouth daily.    Historical Provider, MD  vitamin C (ASCORBIC ACID) 500 MG tablet Take 1 tablet (500 mg total) by mouth daily. 01/03/17   Nori Riis, PA-C    Allergies Cefdinir; Codeine; Ibandronic acid; Latex; Nitrofurantoin; and Raloxifene    Social History Social History  Substance Use Topics  . Smoking status: Never Smoker  . Smokeless tobacco: Never Used  . Alcohol use No    Review of Systems Patient denies headaches, rhinorrhea, blurry vision, numbness, shortness of breath, chest pain, edema, cough, abdominal pain, nausea, vomiting, diarrhea, dysuria, fevers, rashes or hallucinations unless otherwise stated above in HPI. ____________________________________________   PHYSICAL EXAM:  VITAL SIGNS: Vitals:   02/03/17 1620  Pulse: (!) 110  Resp: 19  Temp: 98.6 F (37 C)    Constitutional: Alert but disoriented,  Acutely encephalopathic Eyes: Conjunctivae are normal. PERRL. EOMI. Head: Atraumatic. Nose: No congestion/rhinnorhea. Mouth/Throat: Mucous membranes are dry.  Oropharynx non-erythematous. Neck: No stridor. Painless ROM. No cervical spine tenderness to palpation Hematological/Lymphatic/Immunilogical: No cervical lymphadenopathy. Cardiovascular: milldly tachycardic, regular rhythm. Grossly normal heart sounds.  Good peripheral circulation. Respiratory: Normal respiratory effort.  No retractions. Lungs CTAB. Gastrointestinal: Soft and nontender. No distention. No abdominal bruits. No CVA tenderness. Genitourinary:  Musculoskeletal: No lower extremity tenderness nor edema.  No joint effusions. Neurologic:  Normal speech and language. No gross focal neurologic deficits  are appreciated. No gait instability. Skin:  Skin is warm, dry and intact. No rash noted. ________________________________________   LABS (all labs ordered are listed, but only abnormal results are displayed)  Results for orders placed or performed during the hospital encounter of 02/03/17 (from the past 24 hour(s))  CBC with Differential/Platelet     Status: Abnormal   Collection Time: 02/03/17  3:38 PM  Result Value Ref Range   WBC 6.4 3.6 - 11.0 K/uL   RBC 3.57 (L) 3.80 - 5.20 MIL/uL   Hemoglobin 11.4 (L) 12.0 - 16.0 g/dL   HCT 32.6 (L) 35.0 - 47.0 %   MCV 91.4 80.0 - 100.0 fL   MCH 31.9 26.0 - 34.0 pg   MCHC 34.9 32.0 - 36.0 g/dL   RDW 14.4 11.5 - 14.5 %   Platelets 319 150 - 440 K/uL   Neutrophils Relative % 69 %   Neutro Abs 4.4 1.4 - 6.5 K/uL   Lymphocytes Relative 18 %   Lymphs Abs 1.2 1.0 - 3.6 K/uL   Monocytes Relative 11 %   Monocytes Absolute 0.7 0.2 - 0.9 K/uL   Eosinophils Relative 1 %   Eosinophils Absolute 0.1 0 -  0.7 K/uL   Basophils Relative 1 %   Basophils Absolute 0.1 0 - 0.1 K/uL   ____________________________________________  EKG My review and personal interpretation at Time: 15:40   Indication: chest pain  Rate: 70  Rhythm: sinus Axis: normal Other: no acute ischemia,  Normal intervals ____________________________________________  _______________________________________   PROCEDURES  Procedure(s) performed:  Procedures    Critical Care performed: no ____________________________________________   INITIAL IMPRESSION / ASSESSMENT AND PLAN / ED COURSE  Pertinent labs & imaging results that were available during my care of the patient were reviewed by me and considered in my medical decision making (see chart for details).  DDX: Dehydration, sepsis, pna, uti, hypoglycemia, cva, drug effect, withdrawal, encephalitis   Julia Alvarado is a 76 y.o. who presents to the ED with Evidence of ESBL UTI not on any antibiotics and According to the  nursing home patient was febrile to 100.6 today.  She arrives to the ER afebrile but is mildly tachycardic. No evidence of significant acidosis on blood work. No leukocytosis at this time but patient according to family is more encephalopathic and baseline. No evidence of trauma. Do not feel CT imaging clinically indicated at this time. The patient will be placed on continuous pulse oximetry and telemetry for monitoring.  Laboratory evaluation will be sent to evaluate for the above complaints.      Clinical Course as of Feb 03 1726  Fri Feb 03, 2017     1715 The patient was febrile today with history of ESBL mild tachycardia with mild hyponatremia do feel patient will require admission for IV antibiotics and further hemodynamic monitoring prior to discharge back to her facility. We'll also arrange PICC placement.  I spoke with Dr. Marthann Schiller who agrees to admit patient for further evaluation and management.  Have discussed with the patient and available family all diagnostics and treatments performed thus far and all questions were answered to the best of my ability. The patient demonstrates understanding and agreement with plan.  [PR]    Clinical Course User Index [PR] Merlyn Lot, MD     ____________________________________________   FINAL CLINICAL IMPRESSION(S) / ED DIAGNOSES  Final diagnoses:  ESBL (extended spectrum beta-lactamase) producing bacteria infection  SIRS (systemic inflammatory response syndrome) (HCC)  Altered mental status, unspecified altered mental status type      NEW MEDICATIONS STARTED DURING THIS VISIT:  New Prescriptions   No medications on file     Note:  This document was prepared using Dragon voice recognition software and may include unintentional dictation errors.    Merlyn Lot, MD 02/03/17 [phone]

## 2017-02-04 ENCOUNTER — Inpatient Hospital Stay: Payer: Medicare Other

## 2017-02-04 DIAGNOSIS — N390 Urinary tract infection, site not specified: Secondary | ICD-10-CM | POA: Diagnosis not present

## 2017-02-04 LAB — CBC
HCT: 30.4 % — ABNORMAL LOW (ref 35.0–47.0)
Hemoglobin: 10.9 g/dL — ABNORMAL LOW (ref 12.0–16.0)
MCH: 32.8 pg (ref 26.0–34.0)
MCHC: 35.8 g/dL (ref 32.0–36.0)
MCV: 91.6 fL (ref 80.0–100.0)
Platelets: 269 10*3/uL (ref 150–440)
RBC: 3.32 MIL/uL — AB (ref 3.80–5.20)
RDW: 14 % (ref 11.5–14.5)
WBC: 3.8 10*3/uL (ref 3.6–11.0)

## 2017-02-04 LAB — BASIC METABOLIC PANEL
Anion gap: 3 — ABNORMAL LOW (ref 5–15)
BUN: 6 mg/dL (ref 6–20)
CHLORIDE: 104 mmol/L (ref 101–111)
CO2: 27 mmol/L (ref 22–32)
CREATININE: 0.73 mg/dL (ref 0.44–1.00)
Calcium: 9 mg/dL (ref 8.9–10.3)
Glucose, Bld: 89 mg/dL (ref 65–99)
POTASSIUM: 3.3 mmol/L — AB (ref 3.5–5.1)
SODIUM: 134 mmol/L — AB (ref 135–145)

## 2017-02-04 LAB — MRSA PCR SCREENING: MRSA by PCR: NEGATIVE

## 2017-02-04 MED ORDER — VITAMIN C 500 MG PO TABS: 500.0000 mg | ORAL_TABLET | Freq: Two times a day (BID) | ORAL | 0 refills | Status: DC

## 2017-02-04 MED ORDER — SODIUM CHLORIDE 0.9 % IV SOLN: 1.0000 g | INTRAVENOUS | 0 refills | Status: AC

## 2017-02-04 MED ORDER — POTASSIUM CHLORIDE CRYS ER 20 MEQ PO TBCR
40.0000 meq | EXTENDED_RELEASE_TABLET | Freq: Once | ORAL | Status: AC
  Administered 2017-02-04: 40 meq via ORAL
  Filled 2017-02-04: qty 2

## 2017-02-04 NOTE — Discharge Summary (Addendum)
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 Arthritis   . Breast cancer ([place])   . Breast cancer ([place])   . GERD (gastroesophageal reflux disease)   . Hyperlipemia   . Hypertension   . Hypothyroidism   . Lewy body dementia without behavioral disturbance   . Sacral fracture ([place]) 1/15    HOSPITAL COURSE:   76 year old female with dementia, recurrent ESBL UTI who presents with increasing confusion.  1. ESBL UTI from previous culture 2 weeks ago treated with an correct antibiotics Patient has Midline and will be discharged with imipenem for total of 10 days. PICC line was not able to be performed. We confirmed that Colbert Ewing can be administered through midline.  2. Dementia: Patient will continue outpatient medications including donepezil and Namenda  3. Hypothyroidism: Continue Synthroid  4. Glaucoma: Continue eyedrops  DISCHARGE CONDITIONS AND DIET:   Stable  Dysphagia 2 with thin liquids diet  CONSULTS OBTAINED:    DRUG ALLERGIES:   Allergies  Allergen Reactions  . Cefdinir Other (See Comments)    Liquid form causes oral thrush  . Codeine Nausea And Vomiting    nausea  . Ibandronic Acid Nausea Only  . Latex Hives  . Nitrofurantoin     Other reaction(s): Vomiting  . Raloxifene Other (See Comments)    Per pt,  "eye doctor said I cannot take" Per pt, "eye doctor said I cannot take" Per pt, "eye doctor said I cannot take"    DISCHARGE MEDICATIONS:   Current Discharge Medication List    START taking these medications   Details  ertapenem 1 g in sodium chloride 0.9 % 50 mL Inject 1 g into the vein daily. Qty: 9 g, Refills: 0      CONTINUE these medications which have CHANGED   Details  vitamin C (ASCORBIC ACID) 500 MG tablet Take 1 tablet (500 mg total) by mouth 2 (two) times daily. Qty: 30 tablet, Refills: 0      CONTINUE these medications which have NOT CHANGED   Details  divalproex (DEPAKOTE) 125 MG DR tablet Take 250 mg by mouth 2 (two) times daily.    donepezil (ARICEPT) 5 MG tablet Take 5 mg by mouth at bedtime.    dorzolamide-timolol (COSOPT) 22.3-6.8 MG/ML ophthalmic solution Place 1 drop into the right eye 2 (two) times daily.    fluticasone (FLONASE) 50 MCG/ACT nasal spray Place into both nostrils daily.    glucosamine-chondroitin (GLUCOSAMINE-CHONDROITIN DS) 500-400 MG tablet Take 1 tablet by mouth 3 (three) times daily. Reported on 06/09/2016    hydroxypropyl methylcellulose / hypromellose (ISOPTO TEARS / GONIOVISC) 2.5 % ophthalmic solution Place 1 drop into both eyes as needed for dry eyes.     ibuprofen (ADVIL,MOTRIN) 400 MG tablet Take 400 mg by mouth 3 (three) times daily. Reported on 06/09/2016    latanoprost (XALATAN) 0.005 % ophthalmic solution Place 1 drop into both eyes at bedtime.  Reported on 06/09/2016    levothyroxine (SYNTHROID, LEVOTHROID) 112 MCG tablet Take 1 tablet (112 mcg total) by mouth daily before breakfast. Reported on 06/09/2016 Qty: 30 tablet, Refills: 0    loratadine (CLARITIN) 10 MG tablet Take 10 mg by mouth daily.    Melatonin 3 MG CAPS Take 1 capsule by mouth at bedtime. Reported on 06/09/2016    memantine (NAMENDA) 5 MG tablet Take 5 mg by mouth 2 (two) times daily.    Omega-3 Fatty Acids (FISH OIL) 1000 MG CAPS Take 1 capsule by mouth  daily. Reported on 06/09/2016    Probiotic Product (PROBIOTIC PO) Place 1 capsule vaginally at bedtime.    sodium chloride (OCEAN) 0.65 % SOLN nasal spray Place 1 spray into both nostrils 5 (five) times daily. Reported on 06/09/2016    traZODone (DESYREL) 50 MG tablet Take 25 mg by mouth at bedtime.     vitamin B-12 (CYANOCOBALAMIN) 1000 MCG tablet Take 1,000 mcg by mouth daily.    risperiDONE (RISPERDAL) 0.25 MG tablet Take 0.5 mg by mouth 2 (two) times daily.       STOP taking these medications     citalopram (CELEXA) 20 MG tablet           Today   CHIEF COMPLAINT:  Patient with no acute events overnight   VITAL SIGNS:  Blood pressure (!) 145/67, pulse 70, temperature 98.6 F (37 C), temperature source Oral, resp. rate 15, height 5' (1.524 m), weight 60.3 kg (132 lb 15 oz), SpO2 97 %.   REVIEW OF SYSTEMS:  ROS Dementia limiting ROS Does say she is having pain her legs  PHYSICAL EXAMINATION:  GENERAL:  76 y.o.-year-old patient lying in the bed with no acute distress.  NECK:  Supple, no jugular venous distention. No thyroid enlargement, no tenderness.  LUNGS: Normal breath sounds bilaterally, no wheezing, rales,rhonchi  No use of accessory muscles of respiration.  CARDIOVASCULAR: S1, S2 normal. No murmurs, rubs, or gallops.  ABDOMEN: Soft, non-tender, non-distended. Bowel sounds present. No organomegaly or mass.  EXTREMITIES: No pedal edema, cyanosis, or clubbing.  PSYCHIATRIC: The patient is alert and oriented x name only SKIN: No obvious rash, lesion, or ulcer.  No calf tenderness good ankle ROM no edema LE or redness  DATA REVIEW:   CBC  Recent Labs Lab 02/04/17 0317  WBC 3.8  HGB 10.9*  HCT 30.4*  PLT 269    Chemistries   Recent Labs Lab 02/03/17 1538 02/04/17 0317  NA 130* 134*  K 3.5 3.3*  CL 94* 104  CO2 25 27  GLUCOSE 115* 89  BUN 8 6  CREATININE 0.74 0.73  CALCIUM 9.0 9.0  AST 19  --   ALT 11*  --   ALKPHOS 86  --   BILITOT 0.4   --     Cardiac Enzymes  Recent Labs Lab 02/03/17 1538  TROPONINI <0.03    Microbiology Results  @MICRORSLT48 @  RADIOLOGY:  Ct Head Wo Contrast  Result Date: 02/04/2017 CLINICAL DATA:  CVA.  Increased confusion. EXAM: CT HEAD WITHOUT CONTRAST TECHNIQUE: Contiguous axial images were obtained from the base of the skull through the vertex without intravenous contrast. COMPARISON:  12/17/2016 FINDINGS: Brain: No evidence of acute infarction, hemorrhage, hydrocephalus, extra-axial collection or mass lesion/mass effect. Atrophy with ventriculomegaly. Gliosis in the left temporal pole and inferior right frontal lobes, likely posttraumatic. Vascular: No hyperdense vessel or unexpected calcification. Skull: No acute finding Sinuses/Orbits: No acute finding IMPRESSION: 1. No acute finding. 2. Atrophy with  ventriculomegaly. 3. Left temporal pole and inferior right frontal gliosis, likely posttraumatic. Electronically Signed   By: Monte Fantasia M.D.   On: 02/04/2017 12:37   US Venous Img Lower Unilateral Left  Result Date: 02/04/2017 CLINICAL DATA:  Left lower extremity edema EXAM: LEFT LOWER EXTREMITY VENOUS DUPLEX ULTRASOUND TECHNIQUE: Doppler venous assessment of the left lower extremity deep venous system was performed, including characterization of spectral flow, compressibility, and phasicity. COMPARISON:  None. FINDINGS: There is complete compressibility of the common femoral, femoral, and popliteal veins. Doppler analysis demonstrates respiratory phasicity and augmentation of flow with calf compression. No obvious superficial vein or calf vein thrombosis. Left peroneal vein was poorly visualized limiting the examination. IMPRESSION: No evidence of left lower extremity DVT. Electronically Signed   By: Marybelle Killings M.D.   On: 02/04/2017 13:03      Current Discharge Medication List    START taking these medications   Details  ertapenem 1 g in sodium chloride 0.9 % 50 mL Inject 1 g into the  vein daily. Qty: 9 g, Refills: 0      CONTINUE these medications which have CHANGED   Details  vitamin C (ASCORBIC ACID) 500 MG tablet Take 1 tablet (500 mg total) by mouth 2 (two) times daily. Qty: 30 tablet, Refills: 0      CONTINUE these medications which have NOT CHANGED   Details  divalproex (DEPAKOTE) 125 MG DR tablet Take 250 mg by mouth 2 (two) times daily.    donepezil (ARICEPT) 5 MG tablet Take 5 mg by mouth at bedtime.    dorzolamide-timolol (COSOPT) 22.3-6.8 MG/ML ophthalmic solution Place 1 drop into the right eye 2 (two) times daily.    fluticasone (FLONASE) 50 MCG/ACT nasal spray Place into both nostrils daily.    glucosamine-chondroitin (GLUCOSAMINE-CHONDROITIN DS) 500-400 MG tablet Take 1 tablet by mouth 3 (three) times daily. Reported on 06/09/2016    hydroxypropyl methylcellulose / hypromellose (ISOPTO TEARS / GONIOVISC) 2.5 % ophthalmic solution Place 1 drop into both eyes as needed for dry eyes.     ibuprofen (ADVIL,MOTRIN) 400 MG tablet Take 400 mg by mouth 3 (three) times daily. Reported on 06/09/2016    latanoprost (XALATAN) 0.005 % ophthalmic solution Place 1 drop into both eyes at bedtime. Reported on 06/09/2016    levothyroxine (SYNTHROID, LEVOTHROID) 112 MCG tablet Take 1 tablet (112 mcg total) by mouth daily before breakfast. Reported on 06/09/2016 Qty: 30 tablet, Refills: 0    loratadine (CLARITIN) 10 MG tablet Take 10 mg by mouth daily.    Melatonin 3 MG CAPS Take 1 capsule by mouth at bedtime. Reported on 06/09/2016    memantine (NAMENDA) 5 MG tablet Take 5 mg by mouth 2 (two) times daily.    Omega-3 Fatty Acids (FISH OIL) 1000 MG CAPS Take 1 capsule by mouth daily. Reported on 06/09/2016    Probiotic Product (PROBIOTIC PO) Place 1 capsule vaginally at bedtime.    sodium chloride (OCEAN) 0.65 % SOLN nasal spray Place 1 spray into both nostrils 5 (five) times daily. Reported on 06/09/2016    traZODone (DESYREL) 50 MG tablet Take 25 mg by mouth at  bedtime.     vitamin B-12 (CYANOCOBALAMIN) 1000 MCG tablet Take 1,000 mcg by mouth daily.    risperiDONE (RISPERDAL) 0.25 MG tablet Take 0.5 mg by mouth 2 (two) times daily.       STOP taking these medications     citalopram (CELEXA) 20 MG tablet  Management plans discussed with the patient and her family is in agreement. Stable for discharge SNF  Patient should follow up with pcp  CODE STATUS:     Code Status Orders        Start     Ordered   02/03/17 2030  Full code  Continuous     02/03/17 2029    Code Status History    Date Active Date Inactive Code Status Order ID Comments User Context   12/17/2016  5:13 AM 12/18/2016  3:43 PM Full Code [id number]  Harrie Foreman, MD Inpatient    Advance Directive Documentation   Flowsheet Row Most Recent Value  Type of Advance Directive  Healthcare Power of Attorney  Pre-existing out of facility DNR order (yellow form or pink MOST form)  No data  "MOST" Form in Place?  No data      TOTAL TIME TAKING CARE OF THIS PATIENT: 37 minutes.    Note: This dictation was prepared with Dragon dictation along with smaller phrase technology. Any transcriptional errors that result from this process are unintentional.  Piper Hassebrock M.D on 02/04/2017 at 3:16 PM  Between 7am to 6pm - Pager - [phone] After 6pm go to [email or web address] - password [email or web address] [facility]  Office  [phone]  CC: Primary care physician; Valera Castle, MD

## 2017-02-04 NOTE — Consult Note (Signed)
PICC line attempt in left arm unsuccessful because of blockage in chest.. MD Mody aware order changed to midline which was placed successfully

## 2017-02-04 NOTE — Evaluation (Signed)
Clinical/Bedside Swallow Evaluation Patient Details  Name: Julia Alvarado MRN: [id number] Date of Birth: [birth year]  Today's Date: 02/04/2017 Time: SLP Start Time (ACUTE ONLY): 1315 SLP Stop Time (ACUTE ONLY): 1410 SLP Time Calculation (min) (ACUTE ONLY): 55 min  Past Medical History:  Past Medical History:  Diagnosis Date  . Arthritis   . Breast cancer ([place])   . Breast cancer ([place])   . GERD (gastroesophageal reflux disease)   . Hyperlipemia   . Hypertension   . Hypothyroidism   . Lewy body dementia without behavioral disturbance   . Sacral fracture ([place]) 1/15   Past Surgical History:  Past Surgical History:  Procedure Laterality Date  . ABDOMINAL HYSTERECTOMY    . CATARACT EXTRACTION W/ INTRAOCULAR LENS IMPLANT Right   . CATARACT EXTRACTION W/PHACO Right 06/15/2016   Procedure: CATARACT EXTRACTION PHACO AND INTRAOCULAR LENS PLACEMENT (IOC);  Surgeon: Leandrew Koyanagi, MD;  Location: [facility];  Service: Ophthalmology;  Laterality: Right;  . MASTECTOMY Right ~2005  . MASTECTOMY    . RETINAL DETACHMENT REPAIR W/ SCLERAL BUCKLE LE     HPI:  Julia Alvarado  is a 76 y.o. female with a known history of Arthritis, breast cancer, gastroesophageal reflux disease, hyperlipidemia, hypertension, hypothyroidism, sacral fracture- lives in a nursing home because of her dementia, was treated for UTI over there for last 1 or 2 weeks initially with oral antibiotic but when the culture result shows the bacteria are resistant to most of the antibiotics date scheduled her to see infectious disease specialist in the clinic and that was set up for next week.But today patient was noted to have more confusion and had some fever so she was sent to emergency room.   Assessment / Plan / Recommendation Clinical Impression  Pt presents w/mild oral dysphagia and no apparent pharyngeal dysphagia. Pt consumed trials of thin, puree and mechanical soft. Pt demonstrated no overt s/s of aspiration  with any tested consistency. Laryngeal elevation appeared adequate and vocal quality remained clear. Pt demonstrated mild oral phase deficits c/b impaired mastication of larger boluses of soft solids. When smaller boluses were given pt was able to [facility] and clear adequately. Pt judged to be mild aspiraiton risk d/t cognitive deficits and oral dysphagia. Aspiration risk is diminished w/aspiration precautions. Pt observed to have slurred speech which family reports occurs intermittently. Pt was able to produce clearer speech when asked to speak loud and clear. Recommend Dysphagia II diet w/thin liquids and aspiration precautions. Recommend home health SLP to [employer] and to further assess speech deficits as indicated. MD in agreement.     Aspiration Risk  Mild aspiration risk    Diet Recommendation Dysphagia 2 (Fine chop);Thin liquid   Liquid Administration via: Cup Medication Administration: Whole meds with puree Supervision: Patient able to self feed;Intermittent supervision to cue for compensatory strategies Compensations: Slow rate;Minimize environmental distractions;Small sips/bites;Follow solids with liquid Postural Changes: Seated upright at 90 degrees;Remain upright for at least 30 minutes after po intake    Other  Recommendations Oral Care Recommendations: Oral care BID   Follow up Recommendations Home health SLP      Frequency and Duration min 2x/week  1 week       Prognosis Prognosis for Safe Diet Advancement: Good      Swallow Study   General Date of Onset: 02/04/17 HPI: Julia Alvarado  is a 76 y.o. female with a known history of Arthritis, breast cancer, gastroesophageal reflux disease, hyperlipidemia, hypertension, hypothyroidism, sacral fracture- lives in a nursing home  because of her dementia, was treated for UTI over there for last 1 or 2 weeks initially with oral antibiotic but when the culture result shows the bacteria are resistant to most of the antibiotics  date scheduled her to see infectious disease specialist in the clinic and that was set up for next week.But today patient was noted to have more confusion and had some fever so she was sent to emergency room. Type of Study: Bedside Swallow Evaluation Previous Swallow Assessment: Pt had MBSS performed in March of 2017 that revealed on aspiration/laryngeal penetration and mild pharyngeal residue.  Diet Prior to this Study: Dysphagia 2 (chopped);Thin liquids Temperature Spikes Noted: No Respiratory Status: Room air History of Recent Intubation: No Behavior/Cognition: Alert;Cooperative;Pleasant mood Oral Cavity Assessment: Within Functional Limits Oral Care Completed by SLP: No Oral Cavity - Dentition: Dentures, top;Other (Comment) (No lower teeth) Vision: Functional for self-feeding Self-Feeding Abilities: Able to feed self;Needs set up Patient Positioning: Upright in bed Baseline Vocal Quality: Normal Volitional Swallow: Able to elicit    Oral/Motor/Sensory Function Overall Oral Motor/Sensory Function: Mild impairment (Pt observed to have mild generalized weakness, but unsure if some weakness is perceived d/t decrased ability to folllow commands)   Ice Chips Ice chips: Not tested   Thin Liquid Thin Liquid: Within functional limits Presentation: Cup    Nectar Thick Nectar Thick Liquid: Not tested   Honey Thick Honey Thick Liquid: Not tested   Puree Puree: Within functional limits Presentation: Self Fed;Spoon   Solid   GO   Solid: Impaired (Dysphagia 2 level solid) Oral Phase Impairments: Impaired mastication Oral Phase Functional Implications: Impaired mastication Other Comments: Pt demonstrated impaired mastication and would expectorate large bites. But if small boluses were given pt able to masticate and clear well.         Ol[place]o and Olmito,Hikeem Andersson 02/04/2017,2:15 PM

## 2017-02-04 NOTE — Clinical Social Work Note (Signed)
CSW met with patient's family to discuss dc planning. The family reports that they will transport the patient at discharge and are aware that the patient will have [facility] for IV ABX. CSW attempted to speak with the patient for a full assessment and was informed by the vascular wellness person that the family was "probably in the waiting room" and that the patient is "confused". CSW contacted the group home to update on the discharge. CSW will con't to follow pending dc needs.  Santiago Bumpers, MSW, Latanya Presser [phone]

## 2017-02-04 NOTE — Clinical Social Work Note (Signed)
CSW has contacted the patient's [facility] to make sure that they can accept the patient today for discharge. CSW is waiting for clearance from [facility] owner.   Santiago Bumpers, MSW, Latanya Presser [phone]

## 2017-02-04 NOTE — Progress Notes (Signed)
Patient discharging. Midline in place on left upper arm. Dressing dry and intact. Family to transport.

## 2017-02-04 NOTE — NC FL2 (Signed)
[place] LEVEL OF CARE SCREENING TOOL     IDENTIFICATION  Patient Name: Julia Alvarado Birthdate: [birth year] Sex: female Admission Date (Current Location): 02/03/2017  [place] and [place] Number:  Engineering geologist and Address:  [facility], [address] [place], [place] [address]      Provider Number: [id number]  Attending Physician Name and Address:  Bettey Costa, MD  Relative Name and Phone Number:       Current Level of Care: Hospital Recommended Level of Care: Other (Comment) (Group Home) Prior Approval Number:    Date Approved/Denied:   PASRR Number:    Discharge Plan: Other (Comment) (Group home)    Current Diagnoses: Patient Active Problem List   Diagnosis Date Noted  . UTI (urinary tract infection) 02/03/2017  . 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 GERD (gastroesophageal reflux disease)   . Hypothyroidism   . Sacral fracture ([place])   . Lewy body dementia without behavioral disturbance   . Breast cancer ([place])     Orientation RESPIRATION BLADDER Height & Weight     Self  Normal Continent Weight: 132 lb 15 oz (60.3 kg) Height:  5' (152.4 cm)  BEHAVIORAL SYMPTOMS/MOOD NEUROLOGICAL BOWEL NUTRITION STATUS      Continent    AMBULATORY STATUS COMMUNICATION OF NEEDS Skin   Limited Assist Verbally Surgical wounds                       Personal Care Assistance Level of Assistance  Bathing, Feeding, Dressing Bathing Assistance: Limited assistance Feeding assistance: Independent Dressing Assistance: Limited assistance     Functional Limitations Info             SPECIAL CARE FACTORS FREQUENCY   (IV ABX to be handled by [facility] Provider)                    Contractures Contractures Info: Present    Additional Factors Info  Allergies   Allergies Info: Cefdinir, Codeine, Ibandronic Acid, Latex, Nitrofurantoin, Raloxifene           Current Medications (02/04/2017):  This is the current hospital  active medication list Current Facility-Administered Medications  Medication Dose Route Frequency Provider Last Rate Last Dose  . acidophilus (RISAQUAD) capsule 2 capsule  2 capsule Oral Daily Vaughan Basta, MD   2 capsule at 02/04/17 [id number]  . citalopram (CELEXA) tablet 20 mg  20 mg Oral Daily Vaughan Basta, MD   20 mg at 02/04/17 [id number]  . docusate sodium (COLACE) capsule 100 mg  100 mg Oral BID PRN Vaughan Basta, MD   100 mg at 02/04/17 [id number]  . donepezil (ARICEPT) tablet 5 mg  5 mg Oral QHS Vaughan Basta, MD      . dorzolamidel-timolol (COSOPT) 22.3-6.8 MG/ML ophthalmic solution SOLN 1 drop  1 drop Right Eye BID Vaughan Basta, MD   1 drop at 02/04/17 0839  . heparin injection 5,000 Units  5,000 Units Subcutaneous Q8H Vaughan Basta, MD   5,000 Units at 02/04/17 0558  . ibuprofen (ADVIL,MOTRIN) tablet 400 mg  400 mg Oral TID Vaughan Basta, MD   400 mg at 02/04/17 [id number]  . latanoprost (XALATAN) 0.005 % ophthalmic solution 1 drop  1 drop Both Eyes QHS Vaughan Basta, MD      . levothyroxine (SYNTHROID, LEVOTHROID) tablet 112 mcg  112 mcg Oral QAC breakfast Vaughan Basta, MD   112 mcg at 02/04/17 0837  . loratadine (CLARITIN) tablet 10 mg  10  mg Oral Daily Vaughan Basta, MD   10 mg at 02/04/17 [id number]  . Melatonin TABS 5 mg  5 mg Oral QHS Vaughan Basta, MD      . memantine [facility]) tablet 5 mg  5 mg Oral BID Vaughan Basta, MD   5 mg at 02/04/17 0837  . meropenem (MERREM) IVPB SOLR 500 mg  500 mg Intravenous Q8H Sheema M Hallaji, RPH   500 mg at 02/04/17 [id number]  . omega-3 acid ethyl esters (LOVAZA) capsule 1 g  1 g Oral Daily Vaughan Basta, MD   1 g at 02/04/17 0837  . polyvinyl alcohol (LIQUIFILM TEARS) 1.4 % ophthalmic solution 1 drop  1 drop Both Eyes PRN Vaughan Basta, MD      . risperiDONE (RISPERDAL) tablet 0.5 mg  0.5 mg Oral BID Vaughan Basta, MD   0.5 mg at 02/04/17 [id number]  . sodium  chloride (OCEAN) 0.65 % nasal spray 1 spray  1 spray Each Nare 5 X Daily Vaughan Basta, MD   1 spray at 02/04/17 [phone]  . traZODone (DESYREL) tablet 25 mg  25 mg Oral QHS Vaughan Basta, MD   25 mg at 02/03/17 2132  . vitamin B-12 (CYANOCOBALAMIN) tablet 1,000 mcg  1,000 mcg Oral Daily Vaughan Basta, MD   1,000 mcg at 02/04/17 [id number]  . vitamin C (ASCORBIC ACID) tablet 500 mg  500 mg Oral Daily Vaughan Basta, MD   500 mg at 02/04/17 [id number]     Discharge Medications: DISCHARGE MEDICATIONS:       Current Discharge Medication List        START taking these medications   Details  ertapenem 1 g in sodium chloride 0.9 % 50 mL Inject 1 g into the vein daily. Qty: 9 g, Refills: 0          CONTINUE these medications which have CHANGED   Details  vitamin C (ASCORBIC ACID) 500 MG tablet Take 1 tablet (500 mg total) by mouth 2 (two) times daily. Qty: 30 tablet, Refills: 0          CONTINUE these medications which have NOT CHANGED   Details  divalproex (DEPAKOTE) 125 MG DR tablet Take 250 mg by mouth 2 (two) times daily.    donepezil (ARICEPT) 5 MG tablet Take 5 mg by mouth at bedtime.    dorzolamide-timolol (COSOPT) 22.3-6.8 MG/ML ophthalmic solution Place 1 drop into the right eye 2 (two) times daily.    fluticasone (FLONASE) 50 MCG/ACT nasal spray Place into both nostrils daily.    glucosamine-chondroitin (GLUCOSAMINE-CHONDROITIN DS) 500-400 MG tablet Take 1 tablet by mouth 3 (three) times daily. Reported on 06/09/2016    hydroxypropyl methylcellulose / hypromellose (ISOPTO TEARS / GONIOVISC) 2.5 % ophthalmic solution Place 1 drop into both eyes as needed for dry eyes.     ibuprofen (ADVIL,MOTRIN) 400 MG tablet Take 400 mg by mouth 3 (three) times daily. Reported on 06/09/2016    latanoprost (XALATAN) 0.005 % ophthalmic solution Place 1 drop into both eyes at bedtime. Reported on 06/09/2016    levothyroxine (SYNTHROID, LEVOTHROID) 112 MCG  tablet Take 1 tablet (112 mcg total) by mouth daily before breakfast. Reported on 06/09/2016 Qty: 30 tablet, Refills: 0    loratadine (CLARITIN) 10 MG tablet Take 10 mg by mouth daily.    Melatonin 3 MG CAPS Take 1 capsule by mouth at bedtime. Reported on 06/09/2016    memantine (NAMENDA) 5 MG tablet Take 5 mg by mouth 2 (two) times daily.    Omega-3 Fatty  Acids (FISH OIL) 1000 MG CAPS Take 1 capsule by mouth daily. Reported on 06/09/2016    Probiotic Product (PROBIOTIC PO) Place 1 capsule vaginally at bedtime.    sodium chloride (OCEAN) 0.65 % SOLN nasal spray Place 1 spray into both nostrils 5 (five) times daily. Reported on 06/09/2016    traZODone (DESYREL) 50 MG tablet Take 25 mg by mouth at bedtime.     vitamin B-12 (CYANOCOBALAMIN) 1000 MCG tablet Take 1,000 mcg by mouth daily.    risperiDONE (RISPERDAL) 0.25 MG tablet Take 0.5 mg by mouth 2 (two) times daily.          STOP taking these medications     citalopram (CELEXA) 20 MG tablet         Relevant Imaging Results:  Relevant Lab Results:   Additional Information    Zettie Pho, LCSW

## 2017-02-04 NOTE — Progress Notes (Signed)
[facility] vascular wellness attempted to insert PICC and was not able to. Dr Benjie Karvonen called. D/C discharge home order for now.

## 2017-02-04 NOTE — Care Management Note (Addendum)
Case Management Note  Patient Details  Name: Julia Alvarado MRN: [id number] Date of Birth: 1941-09-28  Subjective/Objective:         Dr Benjie Karvonen spoke with [name]'s daughter at bedside and explained the discharge plan . [name] will return to [facility] today after her PICC line is inserted. A MD order for  IV ertapenem 1 gram IV was called to the [facility] pharmacy via [facility]. [facility]X to [facility] prior to first dose on Sunday at 4pm.  Call to [place] at [facility] to advise them that a HH-RN will need to be at [name] group home tomorrow to initiate IV ertapenem 1 gram q 24 hours at 4pm. Jocelyn Lamer at [facility] confirmed that an [facility] nurse will be at the group home at 4pm tomorrow.           Action/Plan:   Expected Discharge Date:  02/04/17               Expected Discharge Plan:     In-House Referral:     Discharge planning Services     Post Acute Care Choice:    Choice offered to:     DME Arranged:    DME Agency:     HH Arranged:    HH Agency:     Status of Service:     If discussed at [employer], dates discussed:    Additional Comments:  Tad Fancher A, RN 02/04/2017, 12:07 PM

## 2017-02-07 LAB — URINE CULTURE

## 2017-02-08 LAB — CULTURE, BLOOD (ROUTINE X 2)
CULTURE: NO GROWTH
Culture: NO GROWTH

## 2017-03-03 ENCOUNTER — Inpatient Hospital Stay
Admission: EM | Admit: 2017-03-03 | Discharge: 2017-03-11 | DRG: 481 | Disposition: A | Discharge status: Skilled Nursing Facility | Payer: Medicare Other | Attending: Internal Medicine | Admitting: Internal Medicine

## 2017-03-03 ENCOUNTER — Encounter: Payer: Self-pay | Admitting: Emergency Medicine

## 2017-03-03 ENCOUNTER — Emergency Department: Payer: Medicare Other

## 2017-03-03 DIAGNOSIS — E876 Hypokalemia: Secondary | ICD-10-CM | POA: Diagnosis present

## 2017-03-03 DIAGNOSIS — M549 Dorsalgia, unspecified: Secondary | ICD-10-CM | POA: Diagnosis not present

## 2017-03-03 DIAGNOSIS — Z90710 Acquired absence of both cervix and uterus: Secondary | ICD-10-CM

## 2017-03-03 DIAGNOSIS — Z8249 Family history of ischemic heart disease and other diseases of the circulatory system: Secondary | ICD-10-CM

## 2017-03-03 DIAGNOSIS — F0281 Dementia in other diseases classified elsewhere with behavioral disturbance: Secondary | ICD-10-CM | POA: Diagnosis present

## 2017-03-03 DIAGNOSIS — M1990 Unspecified osteoarthritis, unspecified site: Secondary | ICD-10-CM | POA: Diagnosis present

## 2017-03-03 DIAGNOSIS — Z888 Allergy status to other drugs, medicaments and biological substances status: Secondary | ICD-10-CM

## 2017-03-03 DIAGNOSIS — S72142A Displaced intertrochanteric fracture of left femur, initial encounter for closed fracture: Secondary | ICD-10-CM | POA: Diagnosis present

## 2017-03-03 DIAGNOSIS — Z79899 Other long term (current) drug therapy: Secondary | ICD-10-CM

## 2017-03-03 DIAGNOSIS — R0781 Pleurodynia: Secondary | ICD-10-CM

## 2017-03-03 DIAGNOSIS — B370 Candidal stomatitis: Secondary | ICD-10-CM | POA: Diagnosis present

## 2017-03-03 DIAGNOSIS — K219 Gastro-esophageal reflux disease without esophagitis: Secondary | ICD-10-CM | POA: Diagnosis present

## 2017-03-03 DIAGNOSIS — Z961 Presence of intraocular lens: Secondary | ICD-10-CM | POA: Diagnosis present

## 2017-03-03 DIAGNOSIS — R1013 Epigastric pain: Secondary | ICD-10-CM | POA: Diagnosis not present

## 2017-03-03 DIAGNOSIS — I10 Essential (primary) hypertension: Secondary | ICD-10-CM | POA: Diagnosis present

## 2017-03-03 DIAGNOSIS — D62 Acute posthemorrhagic anemia: Secondary | ICD-10-CM | POA: Diagnosis not present

## 2017-03-03 DIAGNOSIS — E039 Hypothyroidism, unspecified: Secondary | ICD-10-CM | POA: Diagnosis present

## 2017-03-03 DIAGNOSIS — Z791 Long term (current) use of non-steroidal anti-inflammatories (NSAID): Secondary | ICD-10-CM | POA: Diagnosis not present

## 2017-03-03 DIAGNOSIS — Z9011 Acquired absence of right breast and nipple: Secondary | ICD-10-CM

## 2017-03-03 DIAGNOSIS — Y92098 Other place in other non-institutional residence as the place of occurrence of the external cause: Secondary | ICD-10-CM

## 2017-03-03 DIAGNOSIS — Z91040 Latex allergy status: Secondary | ICD-10-CM

## 2017-03-03 DIAGNOSIS — G3183 Dementia with Lewy bodies: Secondary | ICD-10-CM | POA: Diagnosis present

## 2017-03-03 DIAGNOSIS — Z885 Allergy status to narcotic agent status: Secondary | ICD-10-CM

## 2017-03-03 DIAGNOSIS — Z853 Personal history of malignant neoplasm of breast: Secondary | ICD-10-CM

## 2017-03-03 DIAGNOSIS — E871 Hypo-osmolality and hyponatremia: Secondary | ICD-10-CM | POA: Diagnosis present

## 2017-03-03 DIAGNOSIS — E785 Hyperlipidemia, unspecified: Secondary | ICD-10-CM | POA: Diagnosis present

## 2017-03-03 DIAGNOSIS — W19XXXA Unspecified fall, initial encounter: Secondary | ICD-10-CM

## 2017-03-03 DIAGNOSIS — R739 Hyperglycemia, unspecified: Secondary | ICD-10-CM | POA: Diagnosis not present

## 2017-03-03 DIAGNOSIS — Z9841 Cataract extraction status, right eye: Secondary | ICD-10-CM

## 2017-03-03 DIAGNOSIS — M79606 Pain in leg, unspecified: Secondary | ICD-10-CM | POA: Diagnosis not present

## 2017-03-03 DIAGNOSIS — N390 Urinary tract infection, site not specified: Secondary | ICD-10-CM | POA: Diagnosis present

## 2017-03-03 DIAGNOSIS — W1830XA Fall on same level, unspecified, initial encounter: Secondary | ICD-10-CM | POA: Diagnosis present

## 2017-03-03 DIAGNOSIS — M542 Cervicalgia: Secondary | ICD-10-CM | POA: Diagnosis not present

## 2017-03-03 DIAGNOSIS — S72002A Fracture of unspecified part of neck of left femur, initial encounter for closed fracture: Secondary | ICD-10-CM

## 2017-03-03 LAB — BASIC METABOLIC PANEL
ANION GAP: 8 (ref 5–15)
BUN: 8 mg/dL (ref 6–20)
CALCIUM: 8.6 mg/dL — AB (ref 8.9–10.3)
CO2: 25 mmol/L (ref 22–32)
Chloride: 97 mmol/L — ABNORMAL LOW (ref 101–111)
Creatinine, Ser: 0.6 mg/dL (ref 0.44–1.00)
GFR calc Af Amer: >60 mL/min (ref >60)
GFR calc non Af Amer: >60 mL/min (ref >60)
GLUCOSE: 144 mg/dL — AB (ref 65–99)
Potassium: 3.5 mmol/L (ref 3.5–5.1)
Sodium: 130 mmol/L — ABNORMAL LOW (ref 135–145)

## 2017-03-03 LAB — CBC
HEMATOCRIT: 32.9 % — AB (ref 35.0–47.0)
Hemoglobin: 11 g/dL — ABNORMAL LOW (ref 12.0–16.0)
MCH: 30.9 pg (ref 26.0–34.0)
MCHC: 33.5 g/dL (ref 32.0–36.0)
MCV: 92.1 fL (ref 80.0–100.0)
Platelets: 262 10*3/uL (ref 150–440)
RBC: 3.58 MIL/uL — ABNORMAL LOW (ref 3.80–5.20)
RDW: 13.9 % (ref 11.5–14.5)
WBC: 12.8 10*3/uL — ABNORMAL HIGH (ref 3.6–11.0)

## 2017-03-03 MED ORDER — CLINDAMYCIN PHOSPHATE 600 MG/50ML IV SOLN
600.0000 mg | Freq: Once | INTRAVENOUS | Status: AC
  Administered 2017-03-04: 600 mg via INTRAVENOUS
  Filled 2017-03-03: qty 50

## 2017-03-03 MED ORDER — SODIUM CHLORIDE 0.9 % IV BOLUS (SEPSIS)
1000.0000 mL | Freq: Once | INTRAVENOUS | Status: AC
  Administered 2017-03-03: 1000 mL via INTRAVENOUS

## 2017-03-03 MED ORDER — FENTANYL CITRATE (PF) 100 MCG/2ML IJ SOLN
50.0000 ug | Freq: Once | INTRAMUSCULAR | Status: AC
  Administered 2017-03-03: 50 ug via INTRAVENOUS
  Filled 2017-03-03: qty 2

## 2017-03-03 NOTE — H&P (Addendum)
History and Physical   [facility] - [place] @ [facility] Admission History and Physical [employer], D.O.    Patient Name: Julia Alvarado MR#: [id number] Date of Birth: [birth year] Date of Admission: 03/03/2017  Referring MD/NP/PA: Dr. Kerman Passey Primary Care Physician: Valera Castle, MD Patient coming from: Assisted living Outpatient Specialists: Urology, neurology   Chief Complaint:  Chief Complaint  Patient presents with  . Fall    HPI: Julia Alvarado is a 76 y.o. female with a known history of osteoarthritis, breast cancer, GERD, hyperlipidemia, hypertension, hypothyroidism, Louis body dementia  presents to the emergency department for evaluation ofLeft hip pain status post fall.  Patient was in a usual state of health until this evening when she is walking down the hallway with out her walker and she sustained a mechanical fall landing on her left side. She was unable to bear weight after the fall was brought to the emergency department for evaluation. Patient's daughter states that she has some dementia, she is supposed to use a walker but she does not. She ambulates independently without the use of a cane or walker.. Patient denies any preceding symptoms such as dizziness, lightheadedness.  Patient did see Dr. Nehemiah Massed in August 2016 for symptoms of dizziness and lightheadedness. She did have an echo at that time and the results are listed below. She has no difficulty ambulating although she is supposed to use a walker.    Otherwise there has been no change in status. Patient has been taking medication as prescribed and there has been no recent change in medication or diet.  No recent antibiotics.  There has been no recent illness, hospitalizations, travel or sick contacts.    Patient denies fevers/chills, weakness, dizziness, chest pain, shortness of breath, N/V/C/D, abdominal pain, dysuria/frequency, changes in mental status.   ED Course: Patient received  Fentanyl, NS.  Review of Systems:  CONSTITUTIONAL: No fever/chills, fatigue, weakness, weight gain/loss, headache. EYES: No blurry or double vision. ENT: No tinnitus, postnasal drip, redness or soreness of the oropharynx. RESPIRATORY: No cough, dyspnea, wheeze.  No hemoptysis.  CARDIOVASCULAR: No chest pain, palpitations, syncope, orthopnea. No lower extremity edema.  GASTROINTESTINAL: No nausea, vomiting, abdominal pain, diarrhea, constipation.  No hematemesis, melena or hematochezia. GENITOURINARY: No dysuria, frequency, hematuria. ENDOCRINE: No polyuria or nocturia. No heat or cold intolerance. HEMATOLOGY: No anemia, bruising, bleeding. INTEGUMENTARY: No rashes, ulcers, lesions. MUSCULOSKELETAL: No arthritis, gout, dyspnea. Positive left hip pain NEUROLOGIC: No numbness, tingling, ataxia, seizure-type activity, weakness. PSYCHIATRIC: No anxiety, depression, insomnia.   Past Medical History:  Diagnosis Date  . Arthritis   . Breast cancer ([place])   . Breast cancer ([place])   . GERD (gastroesophageal reflux disease)   . Hyperlipemia   . Hypertension   . Hypothyroidism   . Lewy body dementia without behavioral disturbance   . Sacral fracture ([place]) 1/15    Past Surgical History:  Procedure Laterality Date  . ABDOMINAL HYSTERECTOMY    . CATARACT EXTRACTION W/ INTRAOCULAR LENS IMPLANT Right   . CATARACT EXTRACTION W/PHACO Right 06/15/2016   Procedure: CATARACT EXTRACTION PHACO AND INTRAOCULAR LENS PLACEMENT (IOC);  Surgeon: Leandrew Koyanagi, MD;  Location: [place];  Service: Ophthalmology;  Laterality: Right;  . MASTECTOMY Right ~2005  . MASTECTOMY    . RETINAL DETACHMENT REPAIR W/ SCLERAL BUCKLE LE       reports that she has never smoked. She has never used smokeless tobacco. She reports that she does not drink alcohol or use drugs.  Allergies  Allergen  Reactions  . Cefdinir Other (See Comments)    Liquid form causes oral thrush  . Codeine Nausea And Vomiting     nausea  . Ibandronic Acid Nausea Only  . Latex Hives  . Nitrofurantoin     Other reaction(s): Vomiting  . Raloxifene Other (See Comments)    Per pt, "eye doctor said I cannot take" Per pt, "eye doctor said I cannot take" Per pt, "eye doctor said I cannot take"    Family History  Problem Relation Age of Onset  . Heart disease Father   . Cancer Brother     all except 1 died of cancer    Prior to Admission medications   Medication Sig Start Date End Date Taking? Authorizing Provider  divalproex (DEPAKOTE) 125 MG DR tablet Take 250 mg by mouth 2 (two) times daily. 01/25/17 02/24/17  Historical Provider, MD  donepezil (ARICEPT) 5 MG tablet Take 5 mg by mouth at bedtime.    Historical Provider, MD  dorzolamide-timolol (COSOPT) 22.3-6.8 MG/ML ophthalmic solution Place 1 drop into the right eye 2 (two) times daily.    Historical Provider, MD  fluticasone (FLONASE) 50 MCG/ACT nasal spray Place into both nostrils daily.    Historical Provider, MD  glucosamine-chondroitin (GLUCOSAMINE-CHONDROITIN DS) 500-400 MG tablet Take 1 tablet by mouth 3 (three) times daily. Reported on 06/09/2016    Historical Provider, MD  hydroxypropyl methylcellulose / hypromellose (ISOPTO TEARS / GONIOVISC) 2.5 % ophthalmic solution Place 1 drop into both eyes as needed for dry eyes.     Historical Provider, MD  ibuprofen (ADVIL,MOTRIN) 400 MG tablet Take 400 mg by mouth 3 (three) times daily. Reported on 06/09/2016    Historical Provider, MD  latanoprost (XALATAN) 0.005 % ophthalmic solution Place 1 drop into both eyes at bedtime. Reported on 06/09/2016    Historical Provider, MD  levothyroxine (SYNTHROID, LEVOTHROID) 112 MCG tablet Take 1 tablet (112 mcg total) by mouth daily before breakfast. Reported on 06/09/2016 12/18/16   Lytle Butte, MD  loratadine (CLARITIN) 10 MG tablet Take 10 mg by mouth daily.    Historical Provider, MD  Melatonin 3 MG CAPS Take 1 capsule by mouth at bedtime. Reported on 06/09/2016    Historical  Provider, MD  memantine (NAMENDA) 5 MG tablet Take 5 mg by mouth 2 (two) times daily.    Historical Provider, MD  Omega-3 Fatty Acids (FISH OIL) 1000 MG CAPS Take 1 capsule by mouth daily. Reported on 06/09/2016    Historical Provider, MD  Probiotic Product (PROBIOTIC PO) Place 1 capsule vaginally at bedtime.    Historical Provider, MD  risperiDONE (RISPERDAL) 0.25 MG tablet Take 0.5 mg by mouth 2 (two) times daily.     Historical Provider, MD  sodium chloride (OCEAN) 0.65 % SOLN nasal spray Place 1 spray into both nostrils 5 (five) times daily. Reported on 06/09/2016    Historical Provider, MD  traZODone (DESYREL) 50 MG tablet Take 25 mg by mouth at bedtime.     Historical Provider, MD  vitamin B-12 (CYANOCOBALAMIN) 1000 MCG tablet Take 1,000 mcg by mouth daily.    Historical Provider, MD  vitamin C (ASCORBIC ACID) 500 MG tablet Take 1 tablet (500 mg total) by mouth 2 (two) times daily. 02/04/17   Bettey Costa, MD    Physical Exam: Vitals:   03/03/17 2110 03/03/17 2111 03/03/17 2200 03/03/17 2230  BP:   (!) 126/55 (!) 130/50  Pulse: 79  65 65  Resp: (!) 22  12 15   Temp: 97.9  F (36.6 C)     TempSrc: Oral     SpO2: 97%  92% 99%  Weight:  59 kg (130 lb)    Height:  5\' 6"  (1.676 m)      GENERAL: 76 y.o.-year-old female patient, well-developed, well-nourished lying in the bed in no acute distress.  Pleasantly confused HEENT: Head atraumatic, normocephalic. Pupils equal, round, reactive to light and accommodation. No scleral icterus. Extraocular muscles intact. Nares are patent. Oropharynx is clear. Mucus membranes moist. NECK: Supple, full range of motion. No JVD, no bruit heard. No thyroid enlargement, no tenderness, no cervical lymphadenopathy. CHEST: Normal breath sounds bilaterally. No wheezing, rales, rhonchi or crackles. No use of accessory muscles of respiration.  No reproducible chest wall tenderness.  CARDIOVASCULAR: S1, S2 normal. No murmurs, rubs, or gallops. Cap refill <2 seconds.  Pulses intact distally.  ABDOMEN: Soft, nondistended, nontender. No rebound, guarding, rigidity. Normoactive bowel sounds present in all four quadrants. No organomegaly or mass. EXTREMITIES: Left leg is shortened and externally rotated No pedal edema, cyanosis, or clubbing. No calf tenderness or Homan's sign.  NEUROLOGIC: The patient is alert and oriented x 2.  SKIN: Warm, dry, and intact without obvious rash, lesion, or ulcer.    Labs on Admission:  CBC:  Recent Labs Lab 03/03/17 2156  WBC 12.8*  HGB 11.0*  HCT 32.9*  MCV 92.1  PLT 977   Basic Metabolic Panel:  Recent Labs Lab 03/03/17 2156  NA 130*  K 3.5  CL 97*  CO2 25  GLUCOSE 144*  BUN 8  CREATININE 0.60  CALCIUM 8.6*   GFR: Estimated Creatinine Clearance: 57.5 mL/min (by C-G formula based on SCr of 0.6 mg/dL). Liver Function Tests: No results for input(s): AST, ALT, ALKPHOS, BILITOT, PROT, ALBUMIN in the last 168 hours. No results for input(s): LIPASE, AMYLASE in the last 168 hours. No results for input(s): AMMONIA in the last 168 hours. Coagulation Profile: No results for input(s): INR, PROTIME in the last 168 hours. Cardiac Enzymes: No results for input(s): CKTOTAL, CKMB, CKMBINDEX, TROPONINI in the last 168 hours. BNP (last 3 results) No results for input(s): PROBNP in the last 8760 hours. HbA1C: No results for input(s): HGBA1C in the last 72 hours. CBG: No results for input(s): GLUCAP in the last 168 hours. Lipid Profile: No results for input(s): CHOL, HDL, LDLCALC, TRIG, CHOLHDL, LDLDIRECT in the last 72 hours. Thyroid Function Tests: No results for input(s): TSH, T4TOTAL, FREET4, T3FREE, THYROIDAB in the last 72 hours. Anemia Panel: No results for input(s): VITAMINB12, FOLATE, FERRITIN, TIBC, IRON, RETICCTPCT in the last 72 hours. Urine analysis:    Component Value Date/Time   COLORURINE YELLOW (A) 02/03/2017 1538   APPEARANCEUR HAZY (A) 02/03/2017 1538   APPEARANCEUR Cloudy (A) 01/19/2017  1616   LABSPEC 1.010 02/03/2017 1538   LABSPEC 1.016 01/01/2014 1148   PHURINE 6.0 02/03/2017 1538   GLUCOSEU NEGATIVE 02/03/2017 1538   GLUCOSEU Negative 01/01/2014 1148   HGBUR NEGATIVE 02/03/2017 1538   BILIRUBINUR NEGATIVE 02/03/2017 1538   BILIRUBINUR Negative 01/19/2017 1616   BILIRUBINUR Negative 01/01/2014 1148   KETONESUR NEGATIVE 02/03/2017 1538   PROTEINUR NEGATIVE 02/03/2017 1538   NITRITE POSITIVE (A) 02/03/2017 1538   LEUKOCYTESUR MODERATE (A) 02/03/2017 1538   LEUKOCYTESUR 3+ (A) 01/19/2017 1616   LEUKOCYTESUR Negative 01/01/2014 1148   Sepsis Labs: @LABRCNTIP (procalcitonin:4,lacticidven:4) )No results found for this or any previous visit (from the past 240 hour(s)).   Radiological Exams on Admission: Dg Chest 1 View  Result Date: 03/03/2017 CLINICAL  DATA:  Initial evaluation for acute trauma, fall. EXAM: CHEST 1 VIEW COMPARISON:  Prior radiograph from 12/17/2016. FINDINGS: Mild cardiomegaly, stable. Mediastinal silhouette within normal limits. Lungs normally inflated. Chronic coarsening of the interstitial markings. No focal infiltrates, pulmonary edema, or pleural effusion. No pneumothorax. Irregular biapical pleural thickening, stable. No acute osseous abnormality.  Osteopenia. IMPRESSION: 1. No active cardiopulmonary disease. 2. Stable cardiomegaly. Electronically Signed   By: Jeannine Boga M.D.   On: 03/03/2017 22:23   Dg Hip Unilat W Or Wo Pelvis 2-3 Views Left  Result Date: 03/03/2017 CLINICAL DATA:  Find fall with hip pain EXAM: DG HIP (WITH OR WITHOUT PELVIS) 2-3V LEFT COMPARISON:  None. FINDINGS: There is a medially angulated, superiorly displaced fracture of the intertrochanteric left femur with approximately 4 cm of overriding. The femoral head remains situated within the acetabulum. The right hip is normal. No other pelvic fracture or diastasis is identified. IMPRESSION: Medially angulated and superiorly displaced left femoral intertrochanteric fracture.  Electronically Signed   By: Ulyses Jarred M.D.   On: 03/03/2017 22:20    EKG: Normal sinus rhythm at 80 bpm with normal axis, prolonged QT and nonspecific ST-T wave changes.   ECHO:  08/03/15 INTERPRETATION NORMAL LEFT VENTRICULAR SYSTOLIC FUNCTION WITH AN ESTIMATED EF = 55-60 % NORMAL RIGHT VENTRICULAR SYSTOLIC FUNCTION MODERATE MITRAL VALVE INSUFFICIENCY MILD TRICUSPID VALVE INSUFFICIENCY NO VALVULAR STENOSIS MILD LA ENLARGEMENT  Assessment/Plan  This is a 76 y.o. female with a history of osteoarthritis, breast cancer, GERD, hyperlipidemia, hypertension, hypothyroidism, Lewy body dementia now being admitted with:  #. Left intertrochanteric hip fracture -Admit inpatient -Orthopedics consultation has been requested by the emergency department. -Nothing by mouth after midnight -Cardiac evaluation including echocardiogram done in August 2016 reveals normal systolic function with an EF of 55-60%. Patient has good functional capacity, denies any complaint of chest pain or shortness of breath with or without exertion, EKGs nonischemic. There is currently no contraindication to proceed with surgery as planned  #. Hyponatremia -Gentle IV fluid hydration and recheck BMP in a.m.  #. Anemia, stable, at baseline -Monitor CBC  #. History of arthritis - Hold glucosamine  #. History of hypertension - Monitor  #. History of hyperlipidemia - Hold fish oil  #. History of hypothyroidism - Continue Synthroid  #. History of Lewy body dementia - Continue Aricept, Namenda, Risperdal, trazodone  #. History of allergies -Continue Flonase, Claritin  Admission status: Inpatient IV Fluids: Normal saline Diet/Nutrition: Nothing by mouth Consults called: Ortho Dr. Rudene Christians DVT Px: Heparin SCDs and early ambulation. Code Status: Full Code  Disposition Plan: To rehabilitation in 3-4 days  All the records are reviewed and case discussed with ED provider. Management plans discussed with the  patient and/or family who express understanding and agree with plan of care.  Devun Anna D.O. on 03/03/2017 at 11:03 PM Between 7am to 6pm - Pager - [phone] After 6pm go to [email or web address] - [employer] [facility] [place] [facility] Office [phone] CC: Primary care physician; Valera Castle, MD   03/03/2017, 11:03 PM

## 2017-03-03 NOTE — ED Triage Notes (Signed)
Pt presents to ED via [facility] to evaluated for fall. Shortening and internal rotation noted on left leg/hip. No open wound noted. Hx of dementia.

## 2017-03-03 NOTE — Consult Note (Addendum)
Displaced left intertrochanteric hip fracture, plan ORIF is stable She has a shortened and externally rotated left leg. It's uncertain whether she had a fall because the fracture for fracture came first. She has been a walker with a walker at the assisted living. She suffers from dementia and is a poor historian and her [facility] is given his history. X-ray show a completely displaced intertrochanteric hip fracture on the left without significant osteoarthritis. She has palpable pulses and intact skin Impression is left intertrochanteric hip fracture plan is for ORIF later today. Risks benefits and possible complications discussed

## 2017-03-03 NOTE — ED Provider Notes (Signed)
[facility] Emergency Department Provider Note  Time seen: 9:23 PM  I have reviewed the triage vital signs and the nursing notes.   HISTORY  Chief Complaint Fall    HPI Julia Alvarado is a 76 y.o. female with a past medical history of dementia, hypertension, hyperlipidemia, gastric reflux, presents to the emergency Department from assisted living facility after a fall. The son is here with the patient states the patient per report was walking down the hallway of the assisted living facility when she had a fall onto her left side. Unable to stand after the fall due to left hip pain. Upon arrival patient has a shortened and internally rotated left lower extremity. Has moderate tenderness to the left hip. Patient states she fell but does not recall why she fell. Son states the patient has a fairly significant history of dementia. Patient cannot contribute to her history at this time.  Past Medical History:  Diagnosis Date  . Arthritis   . Breast cancer ([place])   . Breast cancer ([place])   . GERD (gastroesophageal reflux disease)   . Hyperlipemia   . Hypertension   . Hypothyroidism   . Lewy body dementia without behavioral disturbance   . Sacral fracture ([place]) 1/15    Patient Active Problem List   Diagnosis Date Noted  . UTI (urinary tract infection) 02/03/2017  . 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 GERD (gastroesophageal reflux disease)   . Hypothyroidism   . Sacral fracture ([place])   . Lewy body dementia without behavioral disturbance   . Breast cancer [facility])     Past Surgical History:  Procedure Laterality Date  . ABDOMINAL HYSTERECTOMY    . CATARACT EXTRACTION W/ INTRAOCULAR LENS IMPLANT Right   . CATARACT EXTRACTION W/PHACO Right 06/15/2016   Procedure: CATARACT EXTRACTION PHACO AND INTRAOCULAR LENS PLACEMENT (IOC);  Surgeon: Leandrew Koyanagi, MD;  Location: [place];  Service: Ophthalmology;  Laterality: Right;  . MASTECTOMY Right ~2005  . MASTECTOMY     . RETINAL DETACHMENT REPAIR W/ SCLERAL BUCKLE LE      Prior to Admission medications   Medication Sig Start Date End Date Taking? Authorizing Provider  divalproex (DEPAKOTE) 125 MG DR tablet Take 250 mg by mouth 2 (two) times daily. 01/25/17 02/24/17  Historical Provider, MD  donepezil (ARICEPT) 5 MG tablet Take 5 mg by mouth at bedtime.    Historical Provider, MD  dorzolamide-timolol (COSOPT) 22.3-6.8 MG/ML ophthalmic solution Place 1 drop into the right eye 2 (two) times daily.    Historical Provider, MD  fluticasone (FLONASE) 50 MCG/ACT nasal spray Place into both nostrils daily.    Historical Provider, MD  glucosamine-chondroitin (GLUCOSAMINE-CHONDROITIN DS) 500-400 MG tablet Take 1 tablet by mouth 3 (three) times daily. Reported on 06/09/2016    Historical Provider, MD  hydroxypropyl methylcellulose / hypromellose (ISOPTO TEARS / GONIOVISC) 2.5 % ophthalmic solution Place 1 drop into both eyes as needed for dry eyes.     Historical Provider, MD  ibuprofen (ADVIL,MOTRIN) 400 MG tablet Take 400 mg by mouth 3 (three) times daily. Reported on 06/09/2016    Historical Provider, MD  latanoprost (XALATAN) 0.005 % ophthalmic solution Place 1 drop into both eyes at bedtime. Reported on 06/09/2016    Historical Provider, MD  levothyroxine (SYNTHROID, LEVOTHROID) 112 MCG tablet Take 1 tablet (112 mcg total) by mouth daily before breakfast. Reported on 06/09/2016 12/18/16   Lytle Butte, MD  loratadine (CLARITIN) 10 MG tablet Take 10 mg by mouth daily.  Historical Provider, MD  Melatonin 3 MG CAPS Take 1 capsule by mouth at bedtime. Reported on 06/09/2016    Historical Provider, MD  memantine (NAMENDA) 5 MG tablet Take 5 mg by mouth 2 (two) times daily.    Historical Provider, MD  Omega-3 Fatty Acids (FISH OIL) 1000 MG CAPS Take 1 capsule by mouth daily. Reported on 06/09/2016    Historical Provider, MD  Probiotic Product (PROBIOTIC PO) Place 1 capsule vaginally at bedtime.    Historical Provider, MD   risperiDONE (RISPERDAL) 0.25 MG tablet Take 0.5 mg by mouth 2 (two) times daily.     Historical Provider, MD  sodium chloride (OCEAN) 0.65 % SOLN nasal spray Place 1 spray into both nostrils 5 (five) times daily. Reported on 06/09/2016    Historical Provider, MD  traZODone (DESYREL) 50 MG tablet Take 25 mg by mouth at bedtime.     Historical Provider, MD  vitamin B-12 (CYANOCOBALAMIN) 1000 MCG tablet Take 1,000 mcg by mouth daily.    Historical Provider, MD  vitamin C (ASCORBIC ACID) 500 MG tablet Take 1 tablet (500 mg total) by mouth 2 (two) times daily. 02/04/17   Bettey Costa, MD    Allergies  Allergen Reactions  . Cefdinir Other (See Comments)    Liquid form causes oral thrush  . Codeine Nausea And Vomiting    nausea  . Ibandronic Acid Nausea Only  . Latex Hives  . Nitrofurantoin     Other reaction(s): Vomiting  . Raloxifene Other (See Comments)    Per pt, "eye doctor said I cannot take" Per pt, "eye doctor said I cannot take" Per pt, "eye doctor said I cannot take"    Family History  Problem Relation Age of Onset  . Heart disease Father   . Cancer Brother     all except 1 died of cancer    Social History Social History  Substance Use Topics  . Smoking status: Never Smoker  . Smokeless tobacco: Never Used  . Alcohol use No    Review of Systems Constitutional: Negative for fever. Cardiovascular: Negative for chest pain. Respiratory: Negative for shortness of breath. Gastrointestinal: Negative for abdominal pain Musculoskeletal: Left hip pain Neurological: Negative for headache 10-point ROS otherwise negative.  ____________________________________________   PHYSICAL EXAM:  VITAL SIGNS: ED Triage Vitals  Enc Vitals Group     BP --      Pulse Rate 03/03/17 2110 79     Resp 03/03/17 2110 (!) 22     Temp 03/03/17 2110 97.9 F (36.6 C)     Temp Source 03/03/17 2110 Oral     SpO2 03/03/17 2110 97 %     Weight 03/03/17 2111 130 lb (59 kg)     Height 03/03/17  2111 5\' 6"  (1.676 m)     Head Circumference --      Peak Flow --      Pain Score 03/03/17 2112 9     Pain Loc --      Pain Edu? --      Excl. in [place]? --     Constitutional: Alert. Well appearing and in no distress. Eyes: Normal exam ENT   Head: Normocephalic and atraumatic.No cervical spine tenderness   Nose: No congestion/rhinnorhea.   Mouth/Throat: Dry mucous membranes Cardiovascular: Normal rate, regular rhythm. Respiratory: Normal respiratory effort without tachypnea nor retractions. Breath sounds are clear Gastrointestinal: Soft and nontender.  Musculoskeletal: Moderate left hip tenderness palpation, shortened and internally rotated left lower extremity. 2+ DP pulse, sensation  intact and equal, able to move all of her toes. Neurologic:  Normal speech and language. No gross focal neurologic deficits  Skin:  Skin is warm, dry and intact.  Psychiatric: Mood and affect are normal.   ____________________________________________    EKG  EKG reviewed and interpreted by myself shows sinus rhythm 80 bpm, narrow QRS, normal axis, prolonged QTC does specific ST changes. No ST elevation.  ____________________________________________    RADIOLOGY  X-ray appears to be most consistent with left intertrochanteric fracture.   ____________________________________________   INITIAL IMPRESSION / ASSESSMENT AND PLAN / ED COURSE  Pertinent labs & imaging results that were available during my care of the patient were reviewed by me and considered in my medical decision making (see chart for details).  Patient presents to the emergency department after a fall with a shortened and internally rotated left lower extremity. Concern for fracture versus dislocation. We'll obtain x-ray imaging to evaluate. We will treat with fentanyl. The leg is neurovascular intact distally.  X-ray consistent with left intertrochanteric fracture. Discussed the patient with Dr. Rudene Christians.  We'll admit the  patient for further treatment to the medical service. ____________________________________________   FINAL CLINICAL IMPRESSION(S) / ED DIAGNOSES  Left hip fracture    Harvest Dark, MD 03/03/17 2217

## 2017-03-04 ENCOUNTER — Inpatient Hospital Stay: Payer: Medicare Other | Admitting: Certified Registered Nurse Anesthetist

## 2017-03-04 ENCOUNTER — Encounter: Payer: Self-pay | Admitting: Anesthesiology

## 2017-03-04 ENCOUNTER — Inpatient Hospital Stay: Payer: Medicare Other

## 2017-03-04 ENCOUNTER — Encounter
Admission: EM | Disposition: A | Discharge status: Skilled Nursing Facility | Payer: Self-pay | Source: Home / Self Care | Attending: Internal Medicine

## 2017-03-04 HISTORY — PX: FEMUR IM NAIL: SHX1597

## 2017-03-04 LAB — BASIC METABOLIC PANEL
ANION GAP: 5 (ref 5–15)
BUN: 7 mg/dL (ref 6–20)
CALCIUM: 8.1 mg/dL — AB (ref 8.9–10.3)
CO2: 26 mmol/L (ref 22–32)
CREATININE: 0.56 mg/dL (ref 0.44–1.00)
Chloride: 102 mmol/L (ref 101–111)
Glucose, Bld: 113 mg/dL — ABNORMAL HIGH (ref 65–99)
Potassium: 3.3 mmol/L — ABNORMAL LOW (ref 3.5–5.1)
SODIUM: 133 mmol/L — AB (ref 135–145)

## 2017-03-04 LAB — CBC
HEMATOCRIT: 29 % — AB (ref 35.0–47.0)
Hemoglobin: 10.1 g/dL — ABNORMAL LOW (ref 12.0–16.0)
MCH: 32.7 pg (ref 26.0–34.0)
MCHC: 34.7 g/dL (ref 32.0–36.0)
MCV: 94.3 fL (ref 80.0–100.0)
PLATELETS: 218 10*3/uL (ref 150–440)
RBC: 3.07 MIL/uL — ABNORMAL LOW (ref 3.80–5.20)
RDW: 14.2 % (ref 11.5–14.5)
WBC: 6.9 10*3/uL (ref 3.6–11.0)

## 2017-03-04 LAB — SURGICAL PCR SCREEN
MRSA, PCR: NEGATIVE
Staphylococcus aureus: NEGATIVE

## 2017-03-04 LAB — APTT: APTT: 35 s (ref 24–36)

## 2017-03-04 LAB — PROTIME-INR
INR: 1.14
Prothrombin Time: 14.7 seconds (ref 11.4–15.2)

## 2017-03-04 LAB — MAGNESIUM: Magnesium: 1.6 mg/dL — ABNORMAL LOW (ref 1.7–2.4)

## 2017-03-04 SURGERY — INSERTION, INTRAMEDULLARY ROD, FEMUR
Anesthesia: General | Laterality: Left

## 2017-03-04 MED ORDER — MELATONIN 5 MG PO TABS
5.0000 mg | ORAL_TABLET | Freq: Every day | ORAL | Status: DC
  Administered 2017-03-04 – 2017-03-10 (×6): 5 mg via ORAL
  Filled 2017-03-04 (×8): qty 1

## 2017-03-04 MED ORDER — DIVALPROEX SODIUM 250 MG PO DR TAB
250.0000 mg | DELAYED_RELEASE_TABLET | Freq: Two times a day (BID) | ORAL | Status: DC
  Administered 2017-03-04 – 2017-03-11 (×16): 250 mg via ORAL
  Filled 2017-03-04 (×16): qty 1

## 2017-03-04 MED ORDER — ONDANSETRON HCL 4 MG/2ML IJ SOLN: 4.0000 mg | Freq: Four times a day (QID) | INTRAMUSCULAR | Status: DC | PRN

## 2017-03-04 MED ORDER — ONDANSETRON HCL 4 MG/2ML IJ SOLN
INTRAMUSCULAR | Status: DC | PRN
Start: 2017-03-04 — End: 2017-03-04
  Administered 2017-03-04: 4 mg via INTRAVENOUS

## 2017-03-04 MED ORDER — ONDANSETRON HCL 4 MG/2ML IJ SOLN
4.0000 mg | Freq: Once | INTRAMUSCULAR | Status: DC | PRN
Start: 2017-03-04 — End: 2017-03-04

## 2017-03-04 MED ORDER — MEMANTINE HCL 5 MG PO TABS
5.0000 mg | ORAL_TABLET | Freq: Two times a day (BID) | ORAL | Status: DC
  Administered 2017-03-04 – 2017-03-11 (×15): 5 mg via ORAL
  Filled 2017-03-04 (×15): qty 1

## 2017-03-04 MED ORDER — HYDROCODONE-ACETAMINOPHEN 5-325 MG PO TABS
1.0000 | ORAL_TABLET | Freq: Four times a day (QID) | ORAL | Status: DC | PRN
  Administered 2017-03-04: 2 via ORAL
  Filled 2017-03-04: qty 2

## 2017-03-04 MED ORDER — LATANOPROST 0.005 % OP SOLN
1.0000 [drp] | Freq: Every day | OPHTHALMIC | Status: DC
  Administered 2017-03-04 – 2017-03-10 (×8): 1 [drp] via OPHTHALMIC
  Filled 2017-03-04 (×2): qty 2.5

## 2017-03-04 MED ORDER — ACETAMINOPHEN 10 MG/ML IV SOLN
INTRAVENOUS | Status: AC
  Filled 2017-03-04: qty 100

## 2017-03-04 MED ORDER — DOCUSATE SODIUM 100 MG PO CAPS
100.0000 mg | ORAL_CAPSULE | Freq: Two times a day (BID) | ORAL | Status: DC
  Administered 2017-03-04 – 2017-03-10 (×11): 100 mg via ORAL
  Filled 2017-03-04 (×12): qty 1

## 2017-03-04 MED ORDER — FENTANYL CITRATE (PF) 100 MCG/2ML IJ SOLN
INTRAMUSCULAR | Status: AC
  Administered 2017-03-04: 25 ug via INTRAVENOUS
  Filled 2017-03-04: qty 2

## 2017-03-04 MED ORDER — DORZOLAMIDE HCL-TIMOLOL MAL 2-0.5 % OP SOLN: 1.0000 [drp] | Freq: Two times a day (BID) | OPHTHALMIC | Status: DC

## 2017-03-04 MED ORDER — FENTANYL CITRATE (PF) 100 MCG/2ML IJ SOLN
25.0000 ug | INTRAMUSCULAR | Status: DC | PRN
  Administered 2017-03-04 (×2): 25 ug via INTRAVENOUS

## 2017-03-04 MED ORDER — MORPHINE SULFATE (PF) 2 MG/ML IV SOLN
0.5000 mg | INTRAVENOUS | Status: DC | PRN
  Administered 2017-03-04 (×2): 0.5 mg via INTRAVENOUS
  Filled 2017-03-04 (×2): qty 1

## 2017-03-04 MED ORDER — NEOMYCIN-POLYMYXIN B GU 40-200000 IR SOLN
Status: AC
Start: 2017-03-04 — End: 2017-03-04
  Filled 2017-03-04: qty 2

## 2017-03-04 MED ORDER — TRAZODONE HCL 50 MG PO TABS
25.0000 mg | ORAL_TABLET | Freq: Every day | ORAL | Status: DC
  Administered 2017-03-04 – 2017-03-10 (×8): 25 mg via ORAL
  Filled 2017-03-04 (×8): qty 1

## 2017-03-04 MED ORDER — KETAMINE HCL 10 MG/ML IJ SOLN
INTRAMUSCULAR | Status: DC | PRN
Start: 2017-03-04 — End: 2017-03-04
  Administered 2017-03-04: 20 mg via INTRAVENOUS
  Administered 2017-03-04: 30 mg via INTRAVENOUS

## 2017-03-04 MED ORDER — HYPROMELLOSE (GONIOSCOPIC) 2.5 % OP SOLN
1.0000 [drp] | OPHTHALMIC | Status: DC | PRN
  Filled 2017-03-04: qty 15

## 2017-03-04 MED ORDER — RISAQUAD PO CAPS
2.0000 | ORAL_CAPSULE | Freq: Every day | ORAL | Status: DC
  Administered 2017-03-05 – 2017-03-11 (×7): 2 via ORAL
  Filled 2017-03-04 (×8): qty 2

## 2017-03-04 MED ORDER — HYDROMORPHONE HCL 1 MG/ML IJ SOLN
INTRAMUSCULAR | Status: AC
  Filled 2017-03-04: qty 1

## 2017-03-04 MED ORDER — FENTANYL CITRATE (PF) 100 MCG/2ML IJ SOLN
INTRAMUSCULAR | Status: DC | PRN
  Administered 2017-03-04 (×2): 50 ug via INTRAVENOUS

## 2017-03-04 MED ORDER — FLUTICASONE PROPIONATE 50 MCG/ACT NA SUSP
1.0000 | Freq: Every day | NASAL | Status: DC
  Administered 2017-03-06 – 2017-03-11 (×5): 1 via NASAL
  Filled 2017-03-04: qty 16

## 2017-03-04 MED ORDER — EPHEDRINE SULFATE 50 MG/ML IJ SOLN
INTRAMUSCULAR | Status: DC | PRN
  Administered 2017-03-04: 10 mg via INTRAVENOUS

## 2017-03-04 MED ORDER — METOCLOPRAMIDE HCL 5 MG/ML IJ SOLN: 5.0000 mg | Freq: Three times a day (TID) | INTRAMUSCULAR | Status: DC | PRN

## 2017-03-04 MED ORDER — VITAMIN B-12 1000 MCG PO TABS
1000.0000 ug | ORAL_TABLET | Freq: Every day | ORAL | Status: DC
  Administered 2017-03-05 – 2017-03-11 (×7): 1000 ug via ORAL
  Filled 2017-03-04 (×7): qty 1

## 2017-03-04 MED ORDER — LEVOTHYROXINE SODIUM 112 MCG PO TABS
112.0000 ug | ORAL_TABLET | Freq: Every day | ORAL | Status: DC
  Administered 2017-03-05 – 2017-03-11 (×7): 112 ug via ORAL
  Filled 2017-03-04 (×7): qty 1

## 2017-03-04 MED ORDER — PHENYLEPHRINE HCL 10 MG/ML IJ SOLN
INTRAMUSCULAR | Status: DC | PRN
  Administered 2017-03-04 (×2): 100 ug via INTRAVENOUS

## 2017-03-04 MED ORDER — PROPOFOL 10 MG/ML IV BOLUS
INTRAVENOUS | Status: DC | PRN
  Administered 2017-03-04: 120 mg via INTRAVENOUS

## 2017-03-04 MED ORDER — GLYCOPYRROLATE 0.2 MG/ML IJ SOLN
INTRAMUSCULAR | Status: DC | PRN
  Administered 2017-03-04: 0.2 mg via INTRAVENOUS

## 2017-03-04 MED ORDER — LACTATED RINGERS IV SOLN
INTRAVENOUS | Status: DC | PRN
  Administered 2017-03-04: 15:00:00 via INTRAVENOUS

## 2017-03-04 MED ORDER — SODIUM CHLORIDE 0.9 % IV SOLN: INTRAVENOUS | Status: DC

## 2017-03-04 MED ORDER — PROPOFOL 10 MG/ML IV BOLUS
INTRAVENOUS | Status: AC
  Filled 2017-03-04: qty 20

## 2017-03-04 MED ORDER — LIDOCAINE HCL (PF) 2 % IJ SOLN
INTRAMUSCULAR | Status: DC | PRN
  Administered 2017-03-04: 50 mg

## 2017-03-04 MED ORDER — DONEPEZIL HCL 5 MG PO TABS
5.0000 mg | ORAL_TABLET | Freq: Every day | ORAL | Status: DC
  Administered 2017-03-04 – 2017-03-10 (×8): 5 mg via ORAL
  Filled 2017-03-04 (×8): qty 1

## 2017-03-04 MED ORDER — LIDOCAINE HCL (PF) 2 % IJ SOLN
INTRAMUSCULAR | Status: AC
  Filled 2017-03-04: qty 2

## 2017-03-04 MED ORDER — FENTANYL CITRATE (PF) 100 MCG/2ML IJ SOLN
INTRAMUSCULAR | Status: AC
  Filled 2017-03-04: qty 2

## 2017-03-04 MED ORDER — LORATADINE 10 MG PO TABS
10.0000 mg | ORAL_TABLET | Freq: Every day | ORAL | Status: DC
  Administered 2017-03-05 – 2017-03-11 (×7): 10 mg via ORAL
  Filled 2017-03-04 (×7): qty 1

## 2017-03-04 MED ORDER — ACETAMINOPHEN 10 MG/ML IV SOLN
INTRAVENOUS | Status: DC | PRN
  Administered 2017-03-04: 1000 mg via INTRAVENOUS

## 2017-03-04 MED ORDER — MAGNESIUM SULFATE 4 GM/100ML IV SOLN
4.0000 g | Freq: Once | INTRAVENOUS | Status: AC
  Administered 2017-03-04: 4 g via INTRAVENOUS
  Filled 2017-03-04: qty 100

## 2017-03-04 MED ORDER — BUPIVACAINE HCL (PF) 0.5 % IJ SOLN
INTRAMUSCULAR | Status: AC
  Filled 2017-03-04: qty 10

## 2017-03-04 MED ORDER — HEPARIN SODIUM (PORCINE) 5000 UNIT/ML IJ SOLN
5000.0000 [IU] | Freq: Three times a day (TID) | INTRAMUSCULAR | Status: DC
  Administered 2017-03-04: 5000 [IU] via SUBCUTANEOUS
  Filled 2017-03-04: qty 1

## 2017-03-04 MED ORDER — DEXAMETHASONE SODIUM PHOSPHATE 10 MG/ML IJ SOLN
INTRAMUSCULAR | Status: AC
  Filled 2017-03-04: qty 1

## 2017-03-04 MED ORDER — GLYCOPYRROLATE 0.2 MG/ML IJ SOLN
INTRAMUSCULAR | Status: AC
  Filled 2017-03-04: qty 1

## 2017-03-04 MED ORDER — ENOXAPARIN SODIUM 40 MG/0.4ML ~~LOC~~ SOLN
40.0000 mg | SUBCUTANEOUS | Status: DC
  Administered 2017-03-05 – 2017-03-11 (×7): 40 mg via SUBCUTANEOUS
  Filled 2017-03-04 (×7): qty 0.4

## 2017-03-04 MED ORDER — NYSTATIN 100000 UNIT/ML MT SUSP
5.0000 mL | Freq: Four times a day (QID) | OROMUCOSAL | Status: DC
  Administered 2017-03-04 – 2017-03-11 (×22): 500000 [IU] via ORAL
  Filled 2017-03-04 (×21): qty 5

## 2017-03-04 MED ORDER — BISACODYL 10 MG RE SUPP
10.0000 mg | Freq: Every day | RECTAL | Status: DC | PRN
  Administered 2017-03-06: 10 mg via RECTAL
  Filled 2017-03-04: qty 1

## 2017-03-04 MED ORDER — POTASSIUM CHLORIDE IN NACL 20-0.9 MEQ/L-% IV SOLN
INTRAVENOUS | Status: DC
  Administered 2017-03-04: 11:00:00 via INTRAVENOUS
  Filled 2017-03-04 (×10): qty 1000

## 2017-03-04 MED ORDER — MAGNESIUM HYDROXIDE 400 MG/5ML PO SUSP
30.0000 mL | Freq: Every day | ORAL | Status: DC | PRN
  Administered 2017-03-05 – 2017-03-10 (×3): 30 mL via ORAL
  Filled 2017-03-04 (×3): qty 30

## 2017-03-04 MED ORDER — ONDANSETRON HCL 4 MG PO TABS: 4.0000 mg | ORAL_TABLET | Freq: Four times a day (QID) | ORAL | Status: DC | PRN

## 2017-03-04 MED ORDER — MELATONIN 3 MG PO CAPS
1.0000 | ORAL_CAPSULE | Freq: Every day | ORAL | Status: DC
  Filled 2017-03-04: qty 1

## 2017-03-04 MED ORDER — DORZOLAMIDE HCL 2 % OP SOLN: 1.0000 [drp] | Freq: Two times a day (BID) | OPHTHALMIC | Status: DC

## 2017-03-04 MED ORDER — PHENOL 1.4 % MT LIQD
1.0000 | OROMUCOSAL | Status: DC | PRN
  Filled 2017-03-04: qty 177

## 2017-03-04 MED ORDER — DEXAMETHASONE SODIUM PHOSPHATE 10 MG/ML IJ SOLN
INTRAMUSCULAR | Status: DC | PRN
  Administered 2017-03-04: 10 mg via INTRAVENOUS

## 2017-03-04 MED ORDER — CLINDAMYCIN PHOSPHATE 600 MG/50ML IV SOLN
600.0000 mg | Freq: Four times a day (QID) | INTRAVENOUS | Status: AC
  Administered 2017-03-04 – 2017-03-05 (×3): 600 mg via INTRAVENOUS
  Filled 2017-03-04 (×3): qty 50

## 2017-03-04 MED ORDER — KETAMINE HCL 10 MG/ML IJ SOLN
INTRAMUSCULAR | Status: AC
  Filled 2017-03-04: qty 1

## 2017-03-04 MED ORDER — METOCLOPRAMIDE HCL 10 MG PO TABS: 5.0000 mg | ORAL_TABLET | Freq: Three times a day (TID) | ORAL | Status: DC | PRN

## 2017-03-04 MED ORDER — MAGNESIUM CITRATE PO SOLN
1.0000 | Freq: Once | ORAL | Status: DC | PRN
  Filled 2017-03-04: qty 296

## 2017-03-04 MED ORDER — MORPHINE SULFATE (PF) 2 MG/ML IV SOLN: 2.0000 mg | INTRAVENOUS | Status: DC | PRN

## 2017-03-04 MED ORDER — RISPERIDONE 1 MG PO TABS
0.5000 mg | ORAL_TABLET | Freq: Two times a day (BID) | ORAL | Status: DC
  Administered 2017-03-04 (×3): 0.5 mg via ORAL
  Filled 2017-03-04 (×3): qty 1

## 2017-03-04 MED ORDER — MENTHOL 3 MG MT LOZG
1.0000 | LOZENGE | OROMUCOSAL | Status: DC | PRN
  Filled 2017-03-04: qty 9

## 2017-03-04 MED ORDER — ONDANSETRON HCL 4 MG/2ML IJ SOLN
INTRAMUSCULAR | Status: AC
  Filled 2017-03-04: qty 2

## 2017-03-04 MED ORDER — ACETAMINOPHEN 650 MG RE SUPP: 650.0000 mg | Freq: Four times a day (QID) | RECTAL | Status: DC | PRN

## 2017-03-04 MED ORDER — DORZOLAMIDE HCL-TIMOLOL MAL PF 22.3-6.8 MG/ML OP SOLN
1.0000 [drp] | Freq: Two times a day (BID) | OPHTHALMIC | Status: DC
  Administered 2017-03-04 – 2017-03-11 (×15): 1 [drp] via OPHTHALMIC
  Filled 2017-03-04 (×19): qty 0.1

## 2017-03-04 MED ORDER — SALINE SPRAY 0.65 % NA SOLN
1.0000 | Freq: Every day | NASAL | Status: DC
  Administered 2017-03-04 – 2017-03-11 (×22): 1 via NASAL
  Filled 2017-03-04 (×2): qty 44

## 2017-03-04 MED ORDER — VITAMIN C 500 MG PO TABS
500.0000 mg | ORAL_TABLET | Freq: Two times a day (BID) | ORAL | Status: DC
  Administered 2017-03-04 – 2017-03-11 (×15): 500 mg via ORAL
  Filled 2017-03-04 (×14): qty 1

## 2017-03-04 MED ORDER — ACETAMINOPHEN 325 MG PO TABS
650.0000 mg | ORAL_TABLET | Freq: Four times a day (QID) | ORAL | Status: DC | PRN
  Administered 2017-03-05 – 2017-03-10 (×12): 650 mg via ORAL
  Filled 2017-03-04 (×13): qty 2

## 2017-03-04 MED ORDER — ALUM & MAG HYDROXIDE-SIMETH 200-200-20 MG/5ML PO SUSP: 30.0000 mL | ORAL | Status: DC | PRN

## 2017-03-04 SURGICAL SUPPLY — 41 items
BIT DRILL 4.3MMS DISTAL GRDTED (BIT) ×1 IMPLANT
CANISTER SUCT 1200ML W/VALVE (MISCELLANEOUS) ×3 IMPLANT
CHLORAPREP W/TINT 26ML (MISCELLANEOUS) ×3 IMPLANT
CORTICAL BONE SCR 5.0MM X 48MM (Screw) ×3 IMPLANT
DRAPE SHEET LG 3/4 BI-LAMINATE (DRAPES) ×6 IMPLANT
DRAPE SURG 17X11 SM STRL (DRAPES) IMPLANT
DRAPE U-SHAPE 47X51 STRL (DRAPES) IMPLANT
DRILL 4.3MMS DISTAL GRADUATED (BIT) ×3
DRSG OPSITE POSTOP 3X4 (GAUZE/BANDAGES/DRESSINGS) ×9 IMPLANT
ELECT CAUTERY BLADE 6.4 (BLADE) ×3 IMPLANT
ELECT REM PT RETURN 9FT ADLT (ELECTROSURGICAL) ×3
ELECTRODE REM PT RTRN 9FT ADLT (ELECTROSURGICAL) ×1 IMPLANT
GAUZE SPONGE 4X4 12PLY STRL (GAUZE/BANDAGES/DRESSINGS) IMPLANT
GLOVE BIOGEL PI IND STRL 9 (GLOVE) ×1 IMPLANT
GLOVE BIOGEL PI INDICATOR 9 (GLOVE) ×2
GLOVE SURG SYN 9.0  PF PI (GLOVE) ×6
GLOVE SURG SYN 9.0 PF PI (GLOVE) ×3 IMPLANT
GOWN SRG 2XL LVL 4 RGLN SLV (GOWNS) ×1 IMPLANT
GOWN STRL NON-REIN 2XL LVL4 (GOWNS) ×2
GOWN STRL REUS W/ TWL LRG LVL3 (GOWN DISPOSABLE) ×1 IMPLANT
GOWN STRL REUS W/TWL LRG LVL3 (GOWN DISPOSABLE) ×2
GUIDEPIN VERSANAIL DSP 3.2X444 (ORTHOPEDIC DISPOSABLE SUPPLIES) ×3 IMPLANT
GUIDEWIRE BALL NOSE 100CM (WIRE) ×3 IMPLANT
HFN LH 130 DEG 9MM X 320MM (Nail) ×3 IMPLANT
HIP FRA NAIL LAG SCREW 10.5X90 (Orthopedic Implant) ×3 IMPLANT
IV NS 500ML (IV SOLUTION)
IV NS 500ML BAXH (IV SOLUTION) IMPLANT
IV SODIUM CHLORIDE IVPB 50ML (IV SOLUTION) IMPLANT
KIT RM TURNOVER STRD PROC AR (KITS) ×3 IMPLANT
MAT BLUE FLOOR 46X72 FLO (MISCELLANEOUS) ×3 IMPLANT
NEEDLE FILTER BLUNT 18X 1/2SAF (NEEDLE) ×2
NEEDLE FILTER BLUNT 18X1 1/2 (NEEDLE) ×1 IMPLANT
NS IRRIG 1000ML POUR BTL (IV SOLUTION) ×3 IMPLANT
PACK HIP COMPR (MISCELLANEOUS) ×3 IMPLANT
SCREW CORTICL BON 5.0MM X 48MM (Screw) ×1 IMPLANT
SCREW LAG HIP FRA NAIL 10.5X90 (Orthopedic Implant) ×1 IMPLANT
STAPLER SKIN PROX 35W (STAPLE) ×3 IMPLANT
SUT VIC AB 1 CT1 36 (SUTURE) ×3 IMPLANT
SUT VIC AB 2-0 CT1 (SUTURE) ×3 IMPLANT
SYRINGE 10CC LL (SYRINGE) ×3 IMPLANT
TAPE MICROFOAM 4IN (TAPE) IMPLANT

## 2017-03-04 NOTE — Progress Notes (Signed)
Notified Dr. Rudene Christians of duplicate orders keep  The sodium with potasium, keep both morphine and take out duplicate orders.

## 2017-03-04 NOTE — Anesthesia Post-op Follow-up Note (Cosign Needed)
Anesthesia QCDR form completed.        

## 2017-03-04 NOTE — Clinical Social Work Placement (Signed)
   CLINICAL SOCIAL WORK PLACEMENT  NOTE  Date:  03/04/2017  Patient Details  Name: Julia Alvarado MRN: [id number] Date of Birth: [birth year]  Clinical Social Work is seeking post-discharge placement for this patient at the [place] level of care (*CSW will initial, date and re-position this form in  chart as items are completed):  Yes   Patient/family provided with [facility] Work Department's list of facilities offering this level of care within the geographic area requested by the patient (or if unable, by the patient's family).  Yes   Patient/family informed of their freedom to choose among providers that offer the needed level of care, that participate in Medicare, Medicaid or managed care program needed by the patient, have an available bed and are willing to accept the patient.  Yes   Patient/family informed of [facility]'s ownership interest in [facility] and [facility], as well as of the fact that they are under no obligation to receive care at these facilities.  PASRR submitted to EDS on 03/04/17     PASRR number received on       Existing PASRR number confirmed on       FL2 transmitted to all facilities in geographic area requested by pt/family on 03/04/17     FL2 transmitted to all facilities within larger geographic area on       Patient informed that his/her managed care company has contracts with or will negotiate with certain facilities, including the following:            Patient/family informed of bed offers received.  Patient chooses bed at       Physician recommends and patient chooses bed at      Patient to be transferred to   on  .  Patient to be transferred to facility by       Patient family notified on   of transfer.  Name of family member notified:        PHYSICIAN       Additional Comment:    _______________________________________________ Manhattan Mccuen, Veronia Beets, LCSW 03/04/2017, 11:52 AM

## 2017-03-04 NOTE — Progress Notes (Signed)
New Admission Note:   Arrival Method: per stretcher from ED, pt came from [facility] Mental Orientation: alert and oriented to self, pt has history and active dementia Telemetry: none ordered Assessment: Completed Skin: warm, dry, with bruise noted on the left hip from fall, prophylactic sacral foam applied, no open and preexisting wound/s noted IV: G22 on the left AC with transparent dressing, intact Pain: pt screaming in pain, will administer ordered PRN pain medicine Tubes: order for foley catheter carried out. Foley in place, to urine bag, intact and draining Safety Measures: Safety Fall Prevention Plan has been given and discussed to daughters Admission: Completed 1A Orientation: n/a, pt is alert and oriented to self Family: daughters at bedside  Orders have been reviewed and implemented. Will continue to monitor patient. Call light has been placed within reach and bed alarm has been activated.   Georgeanna Harrison BSN, RN [facility] 1A

## 2017-03-04 NOTE — NC FL2 (Signed)
[place] LEVEL OF CARE SCREENING TOOL     IDENTIFICATION  Patient Name: Julia Alvarado Birthdate: 1941-09-25 Sex: female Admission Date (Current Location): 03/03/2017  [facility] and [place] Number:  Engineering geologist and Address:  [facility], [address] [place], [place] [address]      Provider Number: [id number]  Attending Physician Name and Address:  Theodoro Grist, MD  Relative Name and Phone Number:       Current Level of Care: Hospital Recommended Level of Care: [place] Prior Approval Number:    Date Approved/Denied:   PASRR Number:    Discharge Plan: SNF    Current Diagnoses: Patient Active Problem List   Diagnosis Date Noted  . Closed intertrochanteric fracture of hip, left, initial encounter ([place]) 03/03/2017  . UTI (urinary tract infection) 02/03/2017  . 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 GERD (gastroesophageal reflux disease)   . Hypothyroidism   . Sacral fracture ([place])   . Lewy body dementia without behavioral disturbance   . Breast cancer ([place])     Orientation RESPIRATION BLADDER Height & Weight     Self  Normal Continent Weight: 130 lb (59 kg) Height:  5\' 6"  (167.6 cm)  BEHAVIORAL SYMPTOMS/MOOD NEUROLOGICAL BOWEL NUTRITION STATUS   (none)  (none) Continent Diet (NPO for surgery to be advanced )  AMBULATORY STATUS COMMUNICATION OF NEEDS Skin   Extensive Assist Verbally Normal                       Personal Care Assistance Level of Assistance  Bathing, Feeding, Dressing Bathing Assistance: Limited assistance Feeding assistance: Independent Dressing Assistance: Limited assistance     Functional Limitations Info  Sight, Hearing, Speech Sight Info: Adequate Hearing Info: Adequate Speech Info: Adequate    SPECIAL CARE FACTORS FREQUENCY  PT (By licensed PT), OT (By licensed OT)     PT Frequency:  (5) OT Frequency:  (5)            Contractures      Additional Factors Info   Code Status, Allergies, Isolation Precautions Code Status Info:  (Full Code. ) Allergies Info:  (Cefdinir, Codeine, Ibandronic Acid, Latex, Nitrofurantoin, Raloxifene)     Isolation Precautions Info:  (ESBL )     Current Medications (03/04/2017):  This is the current hospital active medication list Current Facility-Administered Medications  Medication Dose Route Frequency Provider Last Rate Last Dose  . 0.9 % NaCl with KCl 20 mEq/ L  infusion   Intravenous Continuous Theodoro Grist, MD 75 mL/hr at 03/04/17 1031    . acidophilus (RISAQUAD) capsule 2 capsule  2 capsule Oral Daily Alexis Hugelmeyer, DO      . clindamycin (CLEOCIN) IVPB 600 mg  600 mg Intravenous Once Hessie Knows, MD      . divalproex (DEPAKOTE) DR tablet 250 mg  250 mg Oral BID Alexis Hugelmeyer, DO   250 mg at 03/04/17 1031  . donepezil (ARICEPT) tablet 5 mg  5 mg Oral QHS Alexis Hugelmeyer, DO   5 mg at 03/04/17 0211  . dorzolamidel-timolol (COSOPT) 22.3-6.8 MG/ML ophthalmic solution SOLN 1 drop  1 drop Right Eye BID Alexis Hugelmeyer, DO   1 drop at 03/04/17 1049  . fluticasone (FLONASE) 50 MCG/ACT nasal spray 1 spray  1 spray Each Nare Daily Alexis Hugelmeyer, DO      . hydroxypropyl methylcellulose / hypromellose (ISOPTO TEARS / GONIOVISC) 2.5 % ophthalmic solution 1 drop  1 drop Both Eyes PRN Ubaldo Glassing  Hugelmeyer, DO      . latanoprost (XALATAN) 0.005 % ophthalmic solution 1 drop  1 drop Both Eyes QHS Alexis Hugelmeyer, DO   1 drop at 03/04/17 0223  . levothyroxine (SYNTHROID, LEVOTHROID) tablet 112 mcg  112 mcg Oral QAC breakfast Alexis Hugelmeyer, DO      . loratadine (CLARITIN) tablet 10 mg  10 mg Oral Daily Alexis Hugelmeyer, DO      . Melatonin TABS 5 mg  5 mg Oral QHS Alexis Hugelmeyer, DO      . memantine (NAMENDA) tablet 5 mg  5 mg Oral BID Alexis Hugelmeyer, DO   5 mg at 03/04/17 0211  . morphine 2 MG/ML injection 0.5 mg  0.5 mg Intravenous Q2H PRN Alexis Hugelmeyer, DO   0.5 mg at 03/04/17 0210  . nystatin  (MYCOSTATIN) 100000 UNIT/ML suspension 500,000 Units  5 mL Oral QID Theodoro Grist, MD      . risperiDONE (RISPERDAL) tablet 0.5 mg  0.5 mg Oral BID Alexis Hugelmeyer, DO   0.5 mg at 03/04/17 1031  . sodium chloride (OCEAN) 0.65 % nasal spray 1 spray  1 spray Each Nare 5 X Daily Alexis Hugelmeyer, DO   1 spray at 03/04/17 0539  . traZODone (DESYREL) tablet 25 mg  25 mg Oral QHS Alexis Hugelmeyer, DO   25 mg at 03/04/17 0210  . vitamin B-12 (CYANOCOBALAMIN) tablet 1,000 mcg  1,000 mcg Oral Daily Alexis Hugelmeyer, DO      . vitamin C (ASCORBIC ACID) tablet 500 mg  500 mg Oral BID Alexis Hugelmeyer, DO   500 mg at 03/04/17 0223     Discharge Medications: Please see discharge summary for a list of discharge medications.  Relevant Imaging Results:  Relevant Lab Results:   Additional Information  (SSN: [id number])  Lindzy Rupert, Veronia Beets, LCSW

## 2017-03-04 NOTE — Op Note (Signed)
03/03/2017 - 03/04/2017  4:01 PM  PATIENT:  Julia Alvarado  76 y.o. female  PRE-OPERATIVE DIAGNOSIS:  left intertrochanteric fracture  POST-OPERATIVE DIAGNOSIS:  same  PROCEDURE:  Procedure(s) with comments: INTRAMEDULLARY (IM) NAIL FEMORAL (Left) - Long Affixus, needs C-Arm  SURGEON: Laurene Footman, MD  ASSISTANTS: None  ANESTHESIA:   general  EBL:  Total I/O In: 400 [I.V.:400] Out: 450 [Urine:300; Blood:150]  BLOOD ADMINISTERED:none  DRAINS: none   LOCAL MEDICATIONS USED:  NONE  SPECIMEN:  No Specimen  DISPOSITION OF SPECIMEN:  N/A  COUNTS:  YES  TOURNIQUET:  * No tourniquets in log *  IMPLANTS: Biomet affixes 9 x 3 20 left rod with 90 mm lag screw and 48 mm interlocking screw  DICTATION: .Dragon Dictation patient brought the operating room and after adequate general anesthesia was obtained patient was placed on the fracture table with the right leg in the well-leg holder and the left foot in the traction boot. It took significant traction to get the leg out to length and get near anatomic alignment. After this was obtained the C-arm well visualizing this in both AP and lateral projections, the hip was prepped and draped in usual sterile fashion using a Barrier drape method. The appropriate patient identification and timeout procedures were completed, and proximal incision was made proximal to the greater trochanter. Guidewire inserted into the tip of the greater trochanter and proximal reaming carried out followed by palpation of the long IM guidewire with measurements made off of this to determine rod length. 11 mm reaming was carried out and this was the appropriate level reaming for a 9 mm rod 9 mm rod was chosen and inserted down the canal to the appropriate level. A small lateral incision was made and through the guide placed near center center guidewire was inserted into the femoral head measurement made and drilling followed by placement of the lag screw. The proximal  screw was then tightened and quarter turn released to allow for compression and traction was released with compression applied through the device getting good compression initial compression of the fracture site. Next the surgeon and/or was removed and permanent AP lateral images obtained. The leg was abducted and good visualization of the distal oblique hole was identified with a small incision made drilling and a 48 mm distal interlocking screw placed. After this was completed the wounds were irrigated and the deep fascia closed with #1 Vicryl 2-0 Vicryl substantially and skin staples. Xeroform and honeycomb dressing was applied  PLAN OF CARE: Continue as an inpatient  PATIENT DISPOSITION:  PACU - hemodynamically stable.

## 2017-03-04 NOTE — Clinical Social Work Note (Signed)
Clinical Social Work Assessment  Patient Details  Name: Julia Alvarado MRN: [id number] Date of Birth: March 03, 1941  Date of referral:  03/04/17               Reason for consult:  Facility Placement                Permission sought to share information with:  Chartered certified accountant granted to share information::  Yes, Verbal Permission Granted  Name::      Warrior::   [place].   Relationship::     Contact Information:     Housing/Transportation Living arrangements for the past 2 months:  [place] of Information:  Adult Children Patient Interpreter Needed:  None Criminal Activity/Legal Involvement Pertinent to Current Situation/Hospitalization:  No - Comment as needed Significant Relationships:  Adult Children Lives with:  Facility Resident Do you feel safe going back to the place where you live?  Yes Need for family participation in patient care:  Yes (Comment)  Care giving concerns: Patient is a long term care resident at [facility] in [place].    Social Worker assessment / plan:  Holiday representative (CSW) received SNF consult. Surgery and PT are pending. CSW met with patient and her son Timmothy Sours and daughter Mardene Celeste were at bedside. Patient was asleep and did not participate in assessment. CSW introduced self and explained role of CSW department. Per son patient has been at [facility] for 1.5 years. CSW explained that patient will need to go to SNF after surgery for short term rehab because group homes can't provide the amount of assistance and rehab the patient will need. CSW explained that medicare requires a 3 night qualifying inpatient stay in order to pay for SNF. Patient was admitted to inpatient on 03/03/17. Son and daughter verbalized their understanding and are agreeable to SNF search in [place]. FL2 complete and faxed out. PASARR is pending. CSW faxed H&P, FL2 and 30  day note into [facility] today. CSW will continue to follow and assist as needed.    Employment status:  Retired Forensic scientist:  Medicare PT Recommendations:  Not assessed at this time Information / Referral to community resources:  [place]  Patient/Family's Response to care:  Patient's son and daughter are agreeable to SNF search in [place].   Patient/Family's Understanding of and Emotional Response to Diagnosis, Current Treatment, and Prognosis:  Patient's family was very pleasant and thanked CSW for assistance.   Emotional Assessment Appearance:  Appears stated age Attitude/Demeanor/Rapport:  Unable to Assess Affect (typically observed):  Unable to Assess Orientation:  Oriented to Self, Fluctuating Orientation (Suspected and/or reported Sundowners) Alcohol / Substance use:  Not Applicable Psych involvement (Current and /or in the community):  No (Comment)  Discharge Needs  Concerns to be addressed:  Discharge Planning Concerns Readmission within the last 30 days:  No Current discharge risk:  Dependent with Mobility Barriers to Discharge:  Continued Medical Work up   [employer], Veronia Beets, LCSW 03/04/2017, 11:53 AM

## 2017-03-04 NOTE — Progress Notes (Signed)
[place] [facility] [place] NAME: Julia Alvarado    MR#:  [id number]  DATE OF BIRTH:  [birth year]  SUBJECTIVE:  CHIEF COMPLAINT:   Chief Complaint  Patient presents with  . Fall   Patient is 76 year old Caucasian female with past medical history significant for history of breast arthritis, breast cancer, gastric esophageal reflux disease, hyperlipidemia, hypertension, hypothyroidism, dementia, who presented to the hospital for left hip pain status post fall. She was not able to be overweight after the fall and was brought to the hospital. She was noted to have left intertrochanteric hip fracture. Orthopedic surgeon consultation was obtained, patient is to undergo rod placement, per Dr. Rudene Christians Review of Systems  Unable to perform ROS: Dementia    VITAL SIGNS: Blood pressure (!) 128/43, pulse 70, temperature 98.8 F (37.1 C), temperature source Oral, resp. rate 18, height 5\' 6"  (1.676 m), weight 59 kg (130 lb), SpO2 96 %.  PHYSICAL EXAMINATION:   GENERAL:  76 y.o.-year-old patient lying in the bed with no acute distress. Sleepy after pain medication administration.  EYES: Pupils equal, round, reactive to light and accommodation. No scleral icterus. Extraocular muscles intact.  HEENT: Head atraumatic, normocephalic. Oropharynx and nasopharynx clear.  NECK:  Supple, no jugular venous distention. No thyroid enlargement, no tenderness.  LUNGS: Normal breath sounds bilaterally, no wheezing, rales,rhonchi or crepitation. No use of accessory muscles of respiration.  CARDIOVASCULAR: S1, S2 normal. No murmurs, rubs, or gallops.  ABDOMEN: Soft, nontender, nondistended. Bowel sounds present. No organomegaly or mass.  EXTREMITIES: No pedal edema, cyanosis, or clubbing. Left lower extremity is internally rotated, shortened NEUROLOGIC: Cranial nerves II through XII are intact. Muscle strength 5/5 in all extremities. Sensation intact. Gait not checked.    PSYCHIATRIC: The patient is somnolent, answers few questions, has significant memory deficit, unable to assess her orientation.  SKIN: No obvious rash, lesion, or ulcer.   ORDERS/RESULTS REVIEWED:   CBC  Recent Labs Lab 03/03/17 2156 03/04/17 0342  WBC 12.8* 6.9  HGB 11.0* 10.1*  HCT 32.9* 29.0*  PLT 262 218  MCV 92.1 94.3  MCH 30.9 32.7  MCHC 33.5 34.7  RDW 13.9 14.2   ------------------------------------------------------------------------------------------------------------------  Chemistries   Recent Labs Lab 03/03/17 2156 03/04/17 0342  NA 130* 133*  K 3.5 3.3*  CL 97* 102  CO2 25 26  GLUCOSE 144* 113*  BUN 8 7  CREATININE 0.60 0.56  CALCIUM 8.6* 8.1*  MG  --  1.6*   ------------------------------------------------------------------------------------------------------------------ estimated creatinine clearance is 57.5 mL/min (by C-G formula based on SCr of 0.56 mg/dL). ------------------------------------------------------------------------------------------------------------------ No results for input(s): TSH, T4TOTAL, T3FREE, THYROIDAB in the last 72 hours.  Invalid input(s): FREET3  Cardiac Enzymes No results for input(s): CKMB, TROPONINI, MYOGLOBIN in the last 168 hours.  Invalid input(s): CK ------------------------------------------------------------------------------------------------------------------ Invalid input(s): POCBNP ---------------------------------------------------------------------------------------------------------------  RADIOLOGY: Dg Chest 1 View  Result Date: 03/03/2017 CLINICAL DATA:  Initial evaluation for acute trauma, fall. EXAM: CHEST 1 VIEW COMPARISON:  Prior radiograph from 12/17/2016. FINDINGS: Mild cardiomegaly, stable. Mediastinal silhouette within normal limits. Lungs normally inflated. Chronic coarsening of the interstitial markings. No focal infiltrates, pulmonary edema, or pleural effusion. No pneumothorax.  Irregular biapical pleural thickening, stable. No acute osseous abnormality.  Osteopenia. IMPRESSION: 1. No active cardiopulmonary disease. 2. Stable cardiomegaly. Electronically Signed   By: Jeannine Boga M.D.   On: 03/03/2017 22:23   Dg Hip Unilat W Or Wo Pelvis 2-3 Views Left  Result Date: 03/03/2017 CLINICAL DATA:  Find  fall with hip pain EXAM: DG HIP (WITH OR WITHOUT PELVIS) 2-3V LEFT COMPARISON:  None. FINDINGS: There is a medially angulated, superiorly displaced fracture of the intertrochanteric left femur with approximately 4 cm of overriding. The femoral head remains situated within the acetabulum. The right hip is normal. No other pelvic fracture or diastasis is identified. IMPRESSION: Medially angulated and superiorly displaced left femoral intertrochanteric fracture. Electronically Signed   By: Ulyses Jarred M.D.   On: 03/03/2017 22:20    EKG:  Orders placed or performed during the hospital encounter of 03/03/17  . ED EKG  . ED EKG  . EKG 12-Lead  . EKG 12-Lead    ASSESSMENT AND PLAN:  Active Problems:   Closed intertrochanteric fracture of hip, left, initial encounter ([place])  #1. Closed intertrochanteric hip fracture, left, initial encounter, continue pain medications, orthopedic surgery consultation is requested, discussed with Dr. Kyla Balzarine, the rod will be placed during operation today, patient has mild criteria for  perioperative cardiovascular events, EKG is unremarkable, no known coronary artery disease, relatively physically active at home. #2. Hyponatremia, improved with IV fluid administration, follow in the morning #3. Hypokalemia, supplement intravenously, magnesium level was found to be low, supplement intravenously as well, follow in the morning #4. Acute posthemorrhagic anemia, follow hemoglobin level as outpatient #5. Leukocytosis, resolved #6. Hyperglycemia, get hemoglobin A1c  Management plans discussed with the patient, family and they are in  agreement.   DRUG ALLERGIES:  Allergies  Allergen Reactions  . Cefdinir Other (See Comments)    Liquid form causes oral thrush  . Codeine Nausea And Vomiting    nausea  . Ibandronic Acid Nausea Only  . Latex Hives  . Nitrofurantoin     Other reaction(s): Vomiting  . Raloxifene Other (See Comments)    Per pt, "eye doctor said I cannot take" Per pt, "eye doctor said I cannot take" Per pt, "eye doctor said I cannot take"    CODE STATUS:     Code Status Orders        Start     Ordered   03/04/17 0111  Full code  Continuous     03/04/17 0110    Code Status History    Date Active Date Inactive Code Status Order ID Comments User Context   02/03/2017  8:29 PM 02/04/2017  7:26 PM Full Code [id number]  Vaughan Basta, MD Inpatient   12/17/2016  5:13 AM 12/18/2016  3:43 PM Full Code [id number]  Harrie Foreman, MD Inpatient    Advance Directive Documentation     Most Recent Value  Type of Advance Directive  Living will, Healthcare Power of Attorney  Pre-existing out of facility DNR order (yellow form or pink MOST form)  -  "MOST" Form in Place?  -      [place] for 40 minutes.    Theodoro Grist M.D on 03/04/2017 at 2:23 PM  Between 7am to 6pm - Pager - [phone]  After 6pm go to [email or web address] - password [email or web address] [facility]  Office  [phone]  CC: Primary care physician; Valera Castle, MD

## 2017-03-04 NOTE — Transfer of Care (Signed)
Immediate Anesthesia Transfer of Care Note  Patient: Julia Alvarado  Procedure(s) Performed: Procedure(s) with comments: INTRAMEDULLARY (IM) NAIL FEMORAL (Left) - Long Affixus, needs C-Arm  Patient Location: PACU  Anesthesia Type:General  Level of Consciousness: sedated  Airway & Oxygen Therapy: Patient Spontanous Breathing and Patient connected to face mask oxygen  Post-op Assessment: Report given to RN and Post -op Vital signs reviewed and stable  Post vital signs: Reviewed  Last Vitals:  Vitals:   03/04/17 1052 03/04/17 1557  BP: (!) 128/43 (!) 179/72  Pulse: 70 96  Resp: 18 15  Temp: 37.1 C 36.4 C    Last Pain:  Vitals:   03/04/17 1052  TempSrc: Oral  PainSc: 3          Complications: No apparent anesthesia complications

## 2017-03-04 NOTE — Anesthesia Preprocedure Evaluation (Signed)
Anesthesia Evaluation  Patient identified by MRN, date of birth, ID band Patient awake    Reviewed: Allergy & Precautions, H&P , NPO status , Patient's Chart, lab work & pertinent test results, reviewed documented beta blocker date and time   History of Anesthesia Complications Negative for: history of anesthetic complications  Airway Mallampati: II  TM Distance: >3 FB Neck ROM: full    Dental  (+) Poor Dentition, Missing   Pulmonary neg pulmonary ROS,           Cardiovascular Exercise Tolerance: Good hypertension, (-) angina(-) CAD, (-) Past MI, (-) Cardiac Stents and (-) CABG (-) dysrhythmias (-) Valvular Problems/Murmurs     Neuro/Psych PSYCHIATRIC DISORDERS (Dementia) negative neurological ROS     GI/Hepatic Neg liver ROS, GERD  ,  Endo/Other  neg diabetesHypothyroidism   Renal/GU negative Renal ROS  negative genitourinary   Musculoskeletal   Abdominal   Peds  Hematology negative hematology ROS (+)   Anesthesia Other Findings Past Medical History: No date: Arthritis No date: Breast cancer (HCC) No date: Breast cancer (HCC) No date: GERD (gastroesophageal reflux disease) No date: Hyperlipemia No date: Hypertension No date: Hypothyroidism No date: Lewy body dementia without behavioral disturba* 1/15: Sacral fracture ([facility])   Reproductive/Obstetrics negative OB ROS                             Anesthesia Physical Anesthesia Plan  ASA: III  Anesthesia Plan: General   Post-op Pain Management:    Induction:   Airway Management Planned:   Additional Equipment:   Intra-op Plan:   Post-operative Plan:   Informed Consent: I have reviewed the patients History and Physical, chart, labs and discussed the procedure including the risks, benefits and alternatives for the proposed anesthesia with the patient or authorized representative who has indicated his/her understanding and  acceptance.   Dental Advisory Given  Plan Discussed with: Anesthesiologist, CRNA and Surgeon  Anesthesia Plan Comments:         Anesthesia Quick Evaluation

## 2017-03-04 NOTE — Anesthesia Procedure Notes (Signed)
Procedure Name: LMA Insertion Performed by: Nancye Grumbine Pre-anesthesia Checklist: Patient identified, Patient being monitored, Timeout performed, Emergency Drugs available and Suction available Patient Re-evaluated:Patient Re-evaluated prior to inductionOxygen Delivery Method: Circle system utilized Preoxygenation: Pre-oxygenation with 100% oxygen Intubation Type: IV induction Ventilation: Mask ventilation without difficulty LMA: LMA inserted LMA Size: 3.5 Tube type: Oral Number of attempts: 1 Placement Confirmation: positive ETCO2 and breath sounds checked- equal and bilateral Tube secured with: Tape Dental Injury: Teeth and Oropharynx as per pre-operative assessment        

## 2017-03-05 LAB — CBC
HCT: 27.7 % — ABNORMAL LOW (ref 35.0–47.0)
Hemoglobin: 9.7 g/dL — ABNORMAL LOW (ref 12.0–16.0)
MCH: 33.1 pg (ref 26.0–34.0)
MCHC: 35.1 g/dL (ref 32.0–36.0)
MCV: 94.3 fL (ref 80.0–100.0)
PLATELETS: 204 10*3/uL (ref 150–440)
RBC: 2.93 MIL/uL — ABNORMAL LOW (ref 3.80–5.20)
RDW: 14.1 % (ref 11.5–14.5)
WBC: 7.7 10*3/uL (ref 3.6–11.0)

## 2017-03-05 LAB — BASIC METABOLIC PANEL
ANION GAP: 6 (ref 5–15)
BUN: 10 mg/dL (ref 6–20)
CALCIUM: 8 mg/dL — AB (ref 8.9–10.3)
CO2: 26 mmol/L (ref 22–32)
CREATININE: 0.59 mg/dL (ref 0.44–1.00)
Chloride: 104 mmol/L (ref 101–111)
GFR calc Af Amer: >60 mL/min (ref >60)
GFR calc non Af Amer: >60 mL/min (ref >60)
GLUCOSE: 171 mg/dL — AB (ref 65–99)
Potassium: 3.7 mmol/L (ref 3.5–5.1)
Sodium: 136 mmol/L (ref 135–145)

## 2017-03-05 LAB — HEMOGLOBIN A1C
Hgb A1c MFr Bld: 5.3 % (ref 4.8–5.6)
MEAN PLASMA GLUCOSE: 105 mg/dL

## 2017-03-05 LAB — MAGNESIUM: Magnesium: 2.3 mg/dL (ref 1.7–2.4)

## 2017-03-05 MED ORDER — RISPERIDONE 0.5 MG PO TBDP
0.5000 mg | ORAL_TABLET | Freq: Four times a day (QID) | ORAL | Status: DC | PRN
  Filled 2017-03-05: qty 1

## 2017-03-05 MED ORDER — RISPERIDONE 1 MG PO TABS
1.0000 mg | ORAL_TABLET | Freq: Two times a day (BID) | ORAL | Status: DC
  Administered 2017-03-05 – 2017-03-06 (×3): 1 mg via ORAL
  Filled 2017-03-05 (×3): qty 1

## 2017-03-05 MED ORDER — SENNA 8.6 MG PO TABS
1.0000 | ORAL_TABLET | Freq: Every day | ORAL | Status: DC
  Administered 2017-03-05 – 2017-03-11 (×7): 8.6 mg via ORAL
  Filled 2017-03-05 (×6): qty 1

## 2017-03-05 MED ORDER — OXYCODONE HCL 5 MG PO TABS: 5.0000 mg | ORAL_TABLET | ORAL | Status: DC | PRN

## 2017-03-05 NOTE — Progress Notes (Signed)
   Subjective: 1 Day Post-Op Procedure(s) (LRB): INTRAMEDULLARY (IM) NAIL FEMORAL (Left) Patient reports pain as moderate.   Patient is well, and has had no acute complaints or problems We will start therapy today.  Plan is to go Rehab after hospital stay. no nausea and no vomiting Patient denies any chest pains or shortness of breath. Patient complained of pain diffusely. Moving left shoulder well.  Objective: Vital signs in last 24 hours: Temp:  [90.1 F (32.3 C)-98.8 F (37.1 C)] 98.2 F (36.8 C) (03/18 0822) Pulse Rate:  [66-104] 91 (03/18 0822) Resp:  [15-19] 18 (03/18 0822) BP: (112-179)/(43-76) 150/66 (03/18 0822) SpO2:  [87 %-100 %] 96 % (03/18 0822) well approximated incision Heels are non tender and elevated off the bed using rolled towels Intake/Output from previous day: 03/17 0701 - 03/18 0700 In: 600 [I.V.:500; IV Piggyback:100] Out: 700 [Urine:550; Blood:150] Intake/Output this shift: No intake/output data recorded.   Recent Labs  03/03/17 2156 03/04/17 0342 03/05/17 0353  HGB 11.0* 10.1* 9.7*    Recent Labs  03/04/17 0342 03/05/17 0353  WBC 6.9 7.7  RBC 3.07* 2.93*  HCT 29.0* 27.7*  PLT 218 204    Recent Labs  03/04/17 0342 03/05/17 0353  NA 133* 136  K 3.3* 3.7  CL 102 104  CO2 26 26  BUN 7 10  CREATININE 0.56 0.59  GLUCOSE 113* 171*  CALCIUM 8.1* 8.0*    Recent Labs  03/04/17 0342  INR 1.14    EXAM General - Patient is Alert and Confused Extremity - Neurologically intact Neurovascular intact Sensation intact distally Intact pulses distally Dorsiflexion/Plantar flexion intact No cellulitis present Compartment soft Dressing - scant drainage Motor Function - intact, moving foot and toes well on exam.    Past Medical History:  Diagnosis Date  . Arthritis   . Breast cancer ([place])   . Breast cancer ([place])   . GERD (gastroesophageal reflux disease)   . Hyperlipemia   . Hypertension   . Hypothyroidism   . Lewy body  dementia without behavioral disturbance   . Sacral fracture ([facility]) 1/15    Assessment/Plan: 1 Day Post-Op Procedure(s) (LRB): INTRAMEDULLARY (IM) NAIL FEMORAL (Left) Active Problems:   Closed intertrochanteric fracture of hip, left, initial encounter ([place])  Estimated body mass index is 20.98 kg/m as calculated from the following:   Height as of this encounter: 5\' 6"  (1.676 m).   Weight as of this encounter: 59 kg (130 lb). Advance diet Up with therapy D/C IV fluids Discharge to SNF  Labs: Were reviewed DVT Prophylaxis - Lovenox, TED hose and Sequential leg wraps Weight-Bearing as tolerated to left leg D/C O2 and Pulse OX and try on Room Air Begin working on bowel movement. Labs tomorrow morning. Internist has changed her from [place] to oxycodone to see if this helps the pain. Patient will need to follow-up in [facility] in 6 weeks. Sooner if any problems Continue Lovenox 40 mg daily for 14 days postop Staples are to be removed 2 weeks postop. Change dressing as needed Do not get the incision wet until staples are removed  Jon R. Ravenel Hoehne 03/05/2017, 8:51 AM

## 2017-03-05 NOTE — Progress Notes (Signed)
Patient is agitated. Refusing medications. Patient removed all dressings and PIV. Dressings replaced. Patient hitting staff and mitts applied to keep dressings in place.

## 2017-03-05 NOTE — Evaluation (Signed)
Physical Therapy Evaluation Patient Details Name: Julia Alvarado MRN: [id number] DOB: 08-17-1941 Today's Date: 03/05/2017   History of Present Illness  76 yo female with onset of L hip fracture in a fall at her ALF, resulting in IM nailing.  PMHx:  OA, breast CA, HTN, Lewy body dementia, sacral fracture  Clinical Impression  Pt is up to transfer with mod to max assist, and not able to step yet.  Pt is somewhat fearful in appearance for the use of walker and standing on her hip but due to dementia is not fully verbalizing the issue.  Will follow acutely as pt is a previously ambulatory person in ALF, and will recommend SNF to restore her independence and safety with gait and transfers.  Focus on standing control, taking steps with walker and transitions from bed to chair.  Family in for evaluation to observe and ask questions.    Follow Up Recommendations SNF    Equipment Recommendations  None recommended by PT    Recommendations for Other Services OT consult     Precautions / Restrictions Precautions Precautions: Fall Precaution Comments: WBAT L hip Restrictions Weight Bearing Restrictions: Yes LLE Weight Bearing: Weight bearing as tolerated      Mobility  Bed Mobility Overal bed mobility: Needs Assistance Bed Mobility: Supine to Sit;Rolling Rolling: Mod assist   Supine to sit: Mod assist        Transfers Overall transfer level: Needs assistance Equipment used: Rolling walker (2 wheeled);1 person hand held assist Transfers: Sit to/from [facility] Sit to Stand: Mod assist Stand pivot transfers: Mod assist;Max assist;+2 physical assistance;From elevated surface       General transfer comment: pt is not verbalizing but likely fearful of her symptoms  Ambulation/Gait             General Gait Details: unable to take steps yet  Stairs            Wheelchair Mobility    Modified Rankin (Stroke Patients Only)       Balance Overall  balance assessment: History of Falls;Needs assistance Sitting-balance support: Feet supported;Bilateral upper extremity supported Sitting balance-Leahy Scale: Fair     Standing balance support: Bilateral upper extremity supported Standing balance-Leahy Scale: Poor                               Pertinent Vitals/Pain Pain Assessment: Faces Faces Pain Scale: Hurts even more Pain Location: L hip Pain Descriptors / Indicators: Operative site guarding Pain Intervention(s): Limited activity within patient's tolerance;Monitored during session;Premedicated before session;Repositioned    Home Living Family/patient expects to be discharged to:: Skilled nursing facility                      Prior Function Level of Independence: Needs assistance               Hand Dominance        Extremity/Trunk Assessment   Upper Extremity Assessment Upper Extremity Assessment: Generalized weakness    Lower Extremity Assessment Lower Extremity Assessment: Generalized weakness    Cervical / Trunk Assessment Cervical / Trunk Assessment: Kyphotic  Communication   Communication: No difficulties  Cognition Arousal/Alertness: Awake/alert Behavior During Therapy: Agitated;Restless Overall Cognitive Status: History of cognitive impairments - at baseline                      General Comments  Exercises     Assessment/Plan    PT Assessment Patient needs continued PT services  PT Problem List Decreased strength;Decreased range of motion;Decreased activity tolerance;Decreased balance;Decreased mobility;Decreased coordination;Decreased cognition;Decreased knowledge of use of DME;Decreased safety awareness;Decreased skin integrity;Pain       PT Treatment Interventions DME instruction;Gait training;Functional mobility training;Therapeutic activities;Therapeutic exercise;Balance training;Neuromuscular re-education;Patient/family education    PT Goals (Current  goals can be found in the Care Plan section)  Acute Rehab PT Goals Patient Stated Goal: none stated PT Goal Formulation: With family Time For Goal Achievement: 03/19/17 Potential to Achieve Goals: Good    Frequency BID   Barriers to discharge Other (comment) (will need 2 person assist and rehab setting ) ALF will not be intensive enough for IM nailing surgery recovery    Co-evaluation               End of Session Equipment Utilized During Treatment: Gait belt Activity Tolerance: Patient limited by pain;Patient limited by lethargy Patient left: in chair;with call bell/phone within reach;with chair alarm set;with family/visitor present Nurse Communication: Mobility status;Other (comment) (transfer instructions to use 2 staff members) PT Visit Diagnosis: Repeated falls (R29.6);History of falling (Z91.81);Unsteadiness on feet (R26.81)    Functional Assessment Tool Used: AM-PAC 6 Clicks Basic Mobility    Time: 2060-1561 PT Time Calculation (min) (ACUTE ONLY): 26 min   Charges:   PT Evaluation $PT Eval Moderate Complexity: 1 Procedure PT Treatments $Therapeutic Activity: 8-22 mins   PT G Codes:   PT G-Codes **NOT FOR INPATIENT CLASS** Functional Assessment Tool Used: AM-PAC 6 Clicks Basic Mobility     Ramond Dial 03/05/2017, 1:22 PM  Mee Hives, PT MS Acute Rehab Dept. Number: [place]

## 2017-03-05 NOTE — Progress Notes (Signed)
[facility] [place] NAME: Julia Alvarado    MR#:  [id number]  DATE OF BIRTH:  16-Nov-1941  SUBJECTIVE:  CHIEF COMPLAINT:   Chief Complaint  Patient presents with  . Fall   Patient is 76 year old Caucasian female with past medical history significant for history of breast arthritis, breast cancer, gastric esophageal reflux disease, hyperlipidemia, hypertension, hypothyroidism, dementia, who presented to the hospital for left hip pain status post fall. She was not able to be overweight after the fall and was brought to the hospital. She was noted to have left intertrochanteric hip fracture. Orthopedic surgeon consultation was obtained, patient underwent intramedullary rod placement, per Dr. Rudene Christians.    Review of Systems  Unable to perform ROS: Dementia    VITAL SIGNS: Blood pressure (!) 150/66, pulse 91, temperature 98.2 F (36.8 C), temperature source Oral, resp. rate 18, height 5\' 6"  (1.676 m), weight 59 kg (130 lb), SpO2 96 %.  PHYSICAL EXAMINATION:   GENERAL:  76 y.o.-year-old patient Sitting in the bed , agitated, restless, shouting for help. Dry oral mucosa  EYES: Pupils equal, round, reactive to light and accommodation. No scleral icterus. Extraocular muscles intact.  HEENT: Head atraumatic, normocephalic. Oropharynx and nasopharynx clear. Oral candidiasis mucosal coating has improved, although erythema remains, scaling around the lips and fissures at lip angles NECK:  Supple, no jugular venous distention. No thyroid enlargement, no tenderness.  LUNGS: Normal breath sounds bilaterally, no wheezing, rales,rhonchi or crepitation. No use of accessory muscles of respiration.  CARDIOVASCULAR: S1, S2 normal. No murmurs, rubs, or gallops.  ABDOMEN: Soft, nontender, nondistended. Bowel sounds present. No organomegaly or mass.  EXTREMITIES: No pedal edema, cyanosis, or clubbing. Left lower extremity proximal femur is areas swollen,  mildly uncomfortable to palpation, but no bruising, no bleeding was noted from incision sites NEUROLOGIC: Cranial nerves II through XII are intact. Muscle strength 5/5 in all extremities. Sensation intact. Gait not checked.  PSYCHIATRIC: The patient is agitated, shouts for help, unable to answer any questions, restless, unable to assess orientation.  SKIN: No obvious rash, lesion, or ulcer.   ORDERS/RESULTS REVIEWED:   CBC  Recent Labs Lab 03/03/17 2156 03/04/17 0342 03/05/17 0353  WBC 12.8* 6.9 7.7  HGB 11.0* 10.1* 9.7*  HCT 32.9* 29.0* 27.7*  PLT 262 218 204  MCV 92.1 94.3 94.3  MCH 30.9 32.7 33.1  MCHC 33.5 34.7 35.1  RDW 13.9 14.2 14.1   ------------------------------------------------------------------------------------------------------------------  Chemistries   Recent Labs Lab 03/03/17 2156 03/04/17 0342 03/05/17 0353  NA 130* 133* 136  K 3.5 3.3* 3.7  CL 97* 102 104  CO2 25 26 26   GLUCOSE 144* 113* 171*  BUN 8 7 10   CREATININE 0.60 0.56 0.59  CALCIUM 8.6* 8.1* 8.0*  MG  --  1.6* 2.3   ------------------------------------------------------------------------------------------------------------------ estimated creatinine clearance is 57.5 mL/min (by C-G formula based on SCr of 0.59 mg/dL). ------------------------------------------------------------------------------------------------------------------ No results for input(s): TSH, T4TOTAL, T3FREE, THYROIDAB in the last 72 hours.  Invalid input(s): FREET3  Cardiac Enzymes No results for input(s): CKMB, TROPONINI, MYOGLOBIN in the last 168 hours.  Invalid input(s): CK ------------------------------------------------------------------------------------------------------------------ Invalid input(s): POCBNP ---------------------------------------------------------------------------------------------------------------  RADIOLOGY: Dg Chest 1 View  Result Date: 03/03/2017 CLINICAL DATA:  Initial evaluation  for acute trauma, fall. EXAM: CHEST 1 VIEW COMPARISON:  Prior radiograph from 12/17/2016. FINDINGS: Mild cardiomegaly, stable. Mediastinal silhouette within normal limits. Lungs normally inflated. Chronic coarsening of the interstitial markings. No focal infiltrates, pulmonary edema, or pleural effusion. No  pneumothorax. Irregular biapical pleural thickening, stable. No acute osseous abnormality.  Osteopenia. IMPRESSION: 1. No active cardiopulmonary disease. 2. Stable cardiomegaly. Electronically Signed   By: Jeannine Boga M.D.   On: 03/03/2017 22:23   Dg Hip Operative Unilat W Or W/o Pelvis Left  Result Date: 03/04/2017 CLINICAL DATA:  LEFT hip fracture EXAM: OPERATIVE LEFT HIP (WITH PELVIS IF PERFORMED) 3 VIEWS TECHNIQUE: Fluoroscopic spot image(s) were submitted for interpretation post-operatively. COMPARISON:  03/03/2017 FINDINGS: Intramedullary nail fixation of intertrochanteric fracture. Dynamic compression screw and distal locking screw noted. IMPRESSION: Internal fixation of intertrochanteric fracture. Electronically Signed   By: Suzy Bouchard M.D.   On: 03/04/2017 17:39   Dg Hip Unilat W Or Wo Pelvis 2-3 Views Left  Result Date: 03/03/2017 CLINICAL DATA:  Find fall with hip pain EXAM: DG HIP (WITH OR WITHOUT PELVIS) 2-3V LEFT COMPARISON:  None. FINDINGS: There is a medially angulated, superiorly displaced fracture of the intertrochanteric left femur with approximately 4 cm of overriding. The femoral head remains situated within the acetabulum. The right hip is normal. No other pelvic fracture or diastasis is identified. IMPRESSION: Medially angulated and superiorly displaced left femoral intertrochanteric fracture. Electronically Signed   By: Ulyses Jarred M.D.   On: 03/03/2017 22:20    EKG:  Orders placed or performed during the hospital encounter of 03/03/17  . ED EKG  . ED EKG  . EKG 12-Lead  . EKG 12-Lead    ASSESSMENT AND PLAN:  Active Problems:   Closed  intertrochanteric fracture of hip, left, initial encounter ([place])  #1. Closed intertrochanteric hip fracture, left, Status post intramedullary rod placement, 03/04/2017 by Dr. Kyla Balzarine, continue pain medications, supportive therapy, IV fluids, likely discharged to skilled nursing facility in 2 days #2. Hyponatremia, resolved with IV fluid administration, follow intermittently  #3. Hypokalemia, supplemented intravenously, magnesium level was found to be low, supplemented intravenously , normalized  #4. Acute posthemorrhagic anemia, relatively stable hemoglobin level postoperatively #5. Leukocytosis, resolved #6. Hyperglycemia, hemoglobin A1c 5.3, no diabetes #7. Agitation, due to underlying dementia, initiate patient on Risperdal, advance medications as needed  Management plans discussed with the patient, family and they are in agreement.   DRUG ALLERGIES:  Allergies  Allergen Reactions  . Cefdinir Other (See Comments)    Liquid form causes oral thrush  . Codeine Nausea And Vomiting    nausea  . Ibandronic Acid Nausea Only  . Latex Hives  . Nitrofurantoin     Other reaction(s): Vomiting  . Raloxifene Other (See Comments)    Per pt, "eye doctor said I cannot take" Per pt, "eye doctor said I cannot take" Per pt, "eye doctor said I cannot take"    CODE STATUS:     Code Status Orders        Start     Ordered   03/04/17 0111  Full code  Continuous     03/04/17 0110    Code Status History    Date Active Date Inactive Code Status Order ID Comments User Context   02/03/2017  8:29 PM 02/04/2017  7:26 PM Full Code [id number]  Vaughan Basta, MD Inpatient   12/17/2016  5:13 AM 12/18/2016  3:43 PM Full Code [id number]  Harrie Foreman, MD Inpatient    Advance Directive Documentation     Most Recent Value  Type of Advance Directive  Living will, Healthcare Power of Attorney  Pre-existing out of facility DNR order (yellow form or pink MOST form)  -  "MOST" Form in Place?  -  TOTAL TIME TAKING CARE OF THIS PATIENT 30 minutes minutes.  Discussed with ortho  surgery  Caila Cirelli M.D on 03/05/2017 at 1:24 PM  Between 7am to 6pm - Pager - [phone]  After 6pm go to [email or web address] - password [email or web address] [facility]  Office  [phone]  CC: Primary care physician; Valera Castle, MD

## 2017-03-05 NOTE — Progress Notes (Signed)
Initial Nutrition Assessment  DOCUMENTATION CODES:   Not applicable  INTERVENTION:  1. Magic cup TID with meals, each supplement provides 290 kcal and 9 grams of protein  NUTRITION DIAGNOSIS:   Increased nutrient needs related to wound healing as evidenced by estimated needs.  GOAL:   Patient will meet greater than or equal to 90% of their needs  MONITOR:   PO intake, I & O's, Labs, Weight trends, Supplement acceptance  REASON FOR ASSESSMENT:   Malnutrition Screening Tool    ASSESSMENT:   Julia Alvarado is a 76 y.o. female with a known history of osteoarthritis, breast cancer, GERD, hyperlipidemia, hypertension, hypothyroidism, Louis body dementia  presents to the emergency department for evaluation ofLeft hip pain status post fall.  Spoke with Ms. Vinciguerra Son at bedside. He reports PTA patient was eating 3 meals per day of whatever she wanted - normal diet is SOFT. He is unsure of any weight loss but does not perceive any. Per chart she exhibits an insignificant 7#/5.1% wt loss over 3 months. She has no PO documented thus far, but Son States she had some eggs and ice cream this morning she tolerated. She did not order Lunch because she was sleepy. She has no acute complaints at this time. Nutrition-Focused physical exam completed. Findings are no fat depletion, moderate muscle depletion, and mild edema.   Labs and medications reviewed: Colace, Senokot, Vitamin C, Vitamin B12 NS w/ KCL 42mEQ @ 35mL/hr  Diet Order:  DIET SOFT Room service appropriate? Yes; Fluid consistency: Thin  Skin:  Reviewed, no issues  Last BM:  PTA  Height:   Ht Readings from Last 1 Encounters:  03/03/17 5\' 6"  (1.676 m)    Weight:   Wt Readings from Last 1 Encounters:  03/03/17 130 lb (59 kg)    Ideal Body Weight:  59.09 kg  BMI:  Body mass index is 20.98 kg/m.  Estimated Nutritional Needs:   Kcal:  1200-1500 calories  Protein:  59-71 gm  Fluid:  >/= 1.2L  EDUCATION  NEEDS:   No education needs identified at this time  Satira Anis. Lorry Furber, MS, RD LDN Inpatient Clinical Dietitian Pager [phone]

## 2017-03-05 NOTE — Clinical Social Work Note (Signed)
CSW spoke to patient's daughter Mardene Celeste [phone] and presented bed offers.  The patient's family chose [facility] [place], who can accept patient once passar number has been received.  CSW to continue to follow patient's progress throughout discharge planning.  Jones Broom. [place], MSW, [place]  03/05/2017 3:08 PM

## 2017-03-06 ENCOUNTER — Encounter: Payer: Self-pay | Admitting: Orthopedic Surgery

## 2017-03-06 LAB — HEMOGLOBIN: HEMOGLOBIN: 8.1 g/dL — AB (ref 12.0–16.0)

## 2017-03-06 MED ORDER — RISPERIDONE 1 MG PO TABS
0.5000 mg | ORAL_TABLET | Freq: Two times a day (BID) | ORAL | Status: DC
  Administered 2017-03-06 – 2017-03-08 (×4): 0.5 mg via ORAL
  Filled 2017-03-06 (×4): qty 1

## 2017-03-06 NOTE — Progress Notes (Signed)
Physical Therapy Treatment Patient Details Name: Julia Alvarado MRN: [id number] DOB: 04/12/1941 Today's Date: 03/06/2017    History of Present Illness 76 yo female with onset of L hip fracture in a fall at her ALF, resulting in IM nailing.  PMHx:  OA, breast CA, HTN, Lewy body dementia, sacral fracture    PT Comments    Pt awake and more alert and confused secondary to history of dementia but able to follow basic commands. She displays significant weakness and increase pain throughout her L hip and knee, able to participate in therex but requires frequent verbal cues and occasional mod assist to complete exercises. She is limited in bed mobility and requires mod assist to move to sitting at EOB, attempted transferring to standing w/ RW and pt requires mod to max assist and leans posteriorly throughout despite tactile cuing. Did not attempt ambulation since patient requires mod to max assist to stand and is unable to maintain standing posture for more then a couple minutes due to pain. Overall patient is severely limited in safe mobility due to L LE weakness and increased pain and will benefit from skilled PT to correct deficits and return to her prior level of function, continue to recommend SNF following acute hospital stay.     Follow Up Recommendations  SNF     Equipment Recommendations  None recommended by PT    Recommendations for Other Services       Precautions / Restrictions Precautions Precautions: Fall Precaution Comments: WBAT L hip Restrictions Weight Bearing Restrictions: Yes LLE Weight Bearing: Weight bearing as tolerated    Mobility  Bed Mobility Overal bed mobility: Needs Assistance Bed Mobility: Supine to Sit;Sit to Supine     Supine to sit: Mod assist;HOB elevated Sit to supine: Max assist;+2 for physical assistance   General bed mobility comments: pt required assistance to sitting w/ advancement of L LE, increase pain w/ movement, max assist to move  back to supine   Transfers Overall transfer level: Needs assistance Equipment used: Rolling walker (2 wheeled) Transfers: Sit to/from Stand Sit to Stand: Mod assist;+2 physical assistance         General transfer comment: requries cuing for foot placement, leans posteriorly with flexed hips throughout transfer, able to stand upright for short durations with tactile cuing then leans posteriorly to sit back down  Ambulation/Gait             General Gait Details: unable to take steps    Stairs            Wheelchair Mobility    Modified Rankin (Stroke Patients Only)       Balance Overall balance assessment: History of Falls;Needs assistance Sitting-balance support: Feet supported;Bilateral upper extremity supported Sitting balance-Leahy Scale: Fair Sitting balance - Comments: requires occasional min to mod assist plus UE support to maintain sitting posture  Postural control: Posterior lean;Left lateral lean Standing balance support: Bilateral upper extremity supported Standing balance-Leahy Scale: Poor Standing balance comment: leans posteriorly in standing w/ RW and requires mod to max assist to maintain standing posture, high fall risk                    Cognition Arousal/Alertness: Awake/alert Behavior During Therapy: Flat affect Overall Cognitive Status: History of cognitive impairments - at baseline                 General Comments: disoriented to time and place     Exercises General Exercises - Lower Extremity  Ankle Circles/Pumps: AROM;Both;10 reps;Strengthening;Supine Heel Slides: Strengthening;Left;10 reps;Supine;AAROM (mod assist ) Hip ABduction/ADduction: AROM;Strengthening;Left;10 reps;Supine;AAROM (min assist ) Straight Leg Raises: Strengthening;Left;10 reps;Supine;AAROM (mod assist ) Other Exercises Other Exercises: transfer training x5- sit to stand to increase functional mobility, pt requires frequent verbal cues and mod to max  assist to move to standing w/ RW, leans posteriolry despite tactile cuing and facilitation     General Comments        Pertinent Vitals/Pain Faces Pain Scale: Hurts even more Pain Location: L hip Pain Descriptors / Indicators: Aching;Grimacing Pain Intervention(s): Limited activity within patient's tolerance;Monitored during session;Repositioned    Home Living                      Prior Function            PT Goals (current goals can now be found in the care plan section) Acute Rehab PT Goals Patient Stated Goal: to get better PT Goal Formulation: With patient Time For Goal Achievement: 03/19/17 Potential to Achieve Goals: Fair Progress towards PT goals: Progressing toward goals    Frequency    BID      PT Plan Current plan remains appropriate    Co-evaluation             End of Session Equipment Utilized During Treatment: Gait belt Activity Tolerance: Patient limited by pain Patient left: in bed;with call bell/phone within reach;with bed alarm set;with SCD's reapplied Nurse Communication: Mobility status PT Visit Diagnosis: Repeated falls (R29.6);History of falling (Z91.81);Unsteadiness on feet (R26.81)     Time: 7673-4193 PT Time Calculation (min) (ACUTE ONLY): 29 min  Charges:                       G Codes:       [employer] Student PT 03/06/2017, 9:33 AM

## 2017-03-06 NOTE — Progress Notes (Signed)
   Subjective: 2 Days Post-Op Procedure(s) (LRB): INTRAMEDULLARY (IM) NAIL FEMORAL (Left) Patient reports pain as mild.   Patient is well, and has had no acute complaints or problems We will continue therapy today.  Plan is to go Rehab after hospital stay. no nausea and no vomiting Patient denies any chest pains or shortness of breath.   Objective: Vital signs in last 24 hours: Temp:  [98.6 F (37 C)] 98.6 F (37 C) (03/19 0754) Pulse Rate:  [85-99] 85 (03/19 0754) Resp:  [18-19] 18 (03/19 0754) BP: (131-142)/(41-56) 142/56 (03/19 0754) SpO2:  [93 %-96 %] 93 % (03/19 0754) well approximated incision Heels are non tender and elevated off the bed using rolled towels Intake/Output from previous day: 03/18 0701 - 03/19 0700 In: 240 [P.O.:240] Out: 202 [Urine:202] Intake/Output this shift: No intake/output data recorded.   Recent Labs  03/03/17 2156 03/04/17 0342 03/05/17 0353 03/06/17 0401  HGB 11.0* 10.1* 9.7* 8.1*    Recent Labs  03/04/17 0342 03/05/17 0353  WBC 6.9 7.7  RBC 3.07* 2.93*  HCT 29.0* 27.7*  PLT 218 204    Recent Labs  03/04/17 0342 03/05/17 0353  NA 133* 136  K 3.3* 3.7  CL 102 104  CO2 26 26  BUN 7 10  CREATININE 0.56 0.59  GLUCOSE 113* 171*  CALCIUM 8.1* 8.0*    Recent Labs  03/04/17 0342  INR 1.14    EXAM General - Patient is Alert and Confused Extremity - Neurologically intact Neurovascular intact Sensation intact distally Intact pulses distally Dorsiflexion/Plantar flexion intact No cellulitis present Compartment soft Dressing - scant drainage Motor Function - intact, moving foot and toes well on exam.    Past Medical History:  Diagnosis Date  . Arthritis   . Breast cancer ([place])   . Breast cancer ([place])   . GERD (gastroesophageal reflux disease)   . Hyperlipemia   . Hypertension   . Hypothyroidism   . Lewy body dementia without behavioral disturbance   . Sacral fracture ([facility]) 1/15    Assessment/Plan: 2  Days Post-Op Procedure(s) (LRB): INTRAMEDULLARY (IM) NAIL FEMORAL (Left) Active Problems:   Closed intertrochanteric fracture of hip, left, initial encounter ([place])   Acute post op blood loss anemia   Estimated body mass index is 20.98 kg/m as calculated from the following:   Height as of this encounter: 5\' 6"  (1.676 m).   Weight as of this encounter: 59 kg (130 lb). Advance diet Up with therapy  Needs BM Patient will need to follow-up in [facility] in 6 weeks. Sooner if any problems Continue Lovenox 40 mg daily for 14 days postop Staples are to be removed 2 weeks postop. Acute post op blood loss anemia - Recheck labs in the am DVT Prophylaxis - Lovenox, TED hose and Sequential leg wraps Weight-Bearing as tolerated to left leg      Duanne Guess PA [place] 03/06/2017, 8:22 AM

## 2017-03-06 NOTE — Progress Notes (Signed)
Physical Therapy Treatment Patient Details Name: Julia Alvarado MRN: [id number] DOB: [birth year] Today's Date: 03/06/2017    History of Present Illness 76 yo female with onset of L hip fracture in a fall at her ALF, resulting in IM nailing.  PMHx:  OA, breast CA, HTN, Lewy body dementia, sacral fracture    PT Comments    Pt more alert this afternoon but demonstrated decrease mobility secondary to cognitive impairments. Pt more apprehensive and anxious and kept saying she was worried about falling. She was able to transfer to sitting at EOB w/ max assist but unable to transfer to standing w/ RW as she became fearful and attempted to sit down despite max assist +2. Attempted to perform squat pivot transfer but patient displayed safety concerns and was constantly reaching to grab PT or OT despite verbal and tactile cuing so she was safely transferred back to supine. Overall patient is limited in safe mobility secondary due to L LE weakness, increased pain and poor cognitive status, she will continue to benefit from skilled PT to correct deficits, continue to recommend STR following acute hospitalization.    Follow Up Recommendations  SNF     Equipment Recommendations  None recommended by PT    Recommendations for Other Services       Precautions / Restrictions Precautions Precautions: Fall Precaution Comments: WBAT L hip Restrictions Weight Bearing Restrictions: Yes LLE Weight Bearing: Weight bearing as tolerated    Mobility  Bed Mobility Overal bed mobility: Needs Assistance Bed Mobility: Supine to Sit;Sit to Supine     Supine to sit: Max assist Sit to supine: Total assist   General bed mobility comments: pt displayed decreased bed mobility due to cognitive deficts, max assist to advance LE and move to sitting, total assist to return to supine  Transfers Overall transfer level: Needs assistance Equipment used: Rolling walker (2 wheeled) Transfers: Sit to/from Stand Sit  to Stand: Max assist;+2 physical assistance         General transfer comment: cuing for hand placement w/ RW, elevated bed to make transfers easier, patient hesistant to stand and fearful of falling unable to achieve upright posture on +3 attempts, attempted lateral scoot transfers but patient unable to follow commands and demonstrated safety concerns   Ambulation/Gait             General Gait Details: unable to take steps    Stairs            Wheelchair Mobility    Modified Rankin (Stroke Patients Only)       Balance Overall balance assessment: History of Falls;Needs assistance Sitting-balance support: Feet supported;Bilateral upper extremity supported Sitting balance-Leahy Scale: Fair Sitting balance - Comments: requires mod assist to maintain sitting balance, attempts to lay down supine  Postural control: Right lateral lean;Posterior lean Standing balance support: Bilateral upper extremity supported Standing balance-Leahy Scale: Poor Standing balance comment: leans posteriorly in standing w/ RW and requires mod to max assist +2 to maintain standing, high fall risk                    Cognition Arousal/Alertness: Awake/alert Behavior During Therapy: Anxious Overall Cognitive Status: History of cognitive impairments - at baseline                 General Comments: appeared apprehensive and anxious throughout session, poor safety awareness and unable to consistently follow commands     Exercises Other Exercises Other Exercises: transfer training x4 to improve functional mobility,  required frequent cues for hand placement and safe use of RW, unable to achieve upright standing posture on multiple attempts due to pain and apprehension    General Comments        Pertinent Vitals/Pain Faces Pain Scale: Hurts even more Pain Descriptors / Indicators: Aching;Grimacing Pain Intervention(s): Monitored during session;Limited activity within patient's  tolerance;Repositioned    Home Living                      Prior Function            PT Goals (current goals can now be found in the care plan section) Acute Rehab PT Goals Patient Stated Goal: to get better PT Goal Formulation: With patient Time For Goal Achievement: 03/19/17 Potential to Achieve Goals: Fair Progress towards PT goals: Not progressing toward goals - comment (decreased participation secondary to cognitive impairments)    Frequency    BID      PT Plan Current plan remains appropriate    Co-evaluation PT/OT/SLP Co-Evaluation/Treatment: Yes Reason for Co-Treatment: To address functional/ADL transfers PT goals addressed during session: Mobility/safety with mobility;Proper use of DME OT goals addressed during session: ADL's and self-care;Proper use of Adaptive equipment and DME     End of Session Equipment Utilized During Treatment: Gait belt Activity Tolerance: Patient limited by pain;Other (comment) (limited due to poor cognitive status) Patient left: in bed;with call bell/phone within reach;with family/visitor present;Other (comment) (OT present) Nurse Communication: Mobility status PT Visit Diagnosis: Repeated falls (R29.6);History of falling (Z91.81);Unsteadiness on feet (R26.81)     Time: 1412-1430 PT Time Calculation (min) (ACUTE ONLY): 18 min  Charges:                       G Codes:       [employer] Student PT 03/06/2017, 3:09 PM

## 2017-03-06 NOTE — Clinical Social Work Note (Signed)
CSW received [facility] number [id number], CSW to continue to follow patient's progress throughout discharge planning.  Plan is for patient to go to [facility] once she is medically ready for discharge and orders have been received.  Jones Broom. [place], MSW, [place]  03/06/2017 2:57 PM

## 2017-03-06 NOTE — Evaluation (Signed)
Occupational Therapy Evaluation Patient Details Name: Julia Alvarado MRN: [id number] DOB: 1941-09-11 Today's Date: 03/06/2017    History of Present Illness 76 yo female with onset of L hip fracture in a fall at her ALF, resulting in IM nailing.  PMHx:  OA, breast CA, HTN, Lewy body dementia, sacral fracture   Clinical Impression   Pt seen for OT evaluation and co-tx with PT today. Pt presents with decreased mobility, increased risk of falls, pain, decreased ROM/strength, decreased safety awareness, and increased need for assistance with self care tasks and functional mobility. Daughter present for duration of session and reports that pt seems much more confused than at baseline and was concerned that perhaps her previous UTI had not completely resolved. At baseline, pt required some assistance for self care tasks at ALF and ambulated indep (should have used RW but often times did not per daughter's report). Pt will benefit from STR to address noted impairments and functional deficits in order to maximize return to PLOF and minimize falls risk.     Follow Up Recommendations  SNF    Equipment Recommendations  None recommended by OT;Other (comment) (defer to next venue of care)    Recommendations for Other Services       Precautions / Restrictions Precautions Precautions: Fall Precaution Comments: WBAT L hip Restrictions Weight Bearing Restrictions: Yes LLE Weight Bearing: Weight bearing as tolerated      Mobility Bed Mobility Overal bed mobility: Needs Assistance Bed Mobility: Supine to Sit;Sit to Supine     Supine to sit: Max assist Sit to supine: Total assist   General bed mobility comments: pt displayed decreased bed mobility due to cognitive deficts, max assist from PT to advance LE and move to sitting, total assist to return to supine  Transfers Overall transfer level: Needs assistance Equipment used: Rolling walker (2 wheeled) Transfers: Sit to/from Stand Sit to  Stand: Max assist;+2 physical assistance         General transfer comment: cuing for hand placement w/ RW, elevated bed to make transfers easier, patient hesistant to stand and fearful of falling unable to achieve upright posture on +3 attempts, attempted lateral scoot transfers but patient unable to follow commands and demonstrated safety concerns     Balance Overall balance assessment: History of Falls;Needs assistance Sitting-balance support: Feet supported;Bilateral upper extremity supported Sitting balance-Leahy Scale: Fair Sitting balance - Comments: requires mod assist from PT to maintain sitting balance, attempts to lay down supine  Postural control: Right lateral lean;Posterior lean Standing balance support: Bilateral upper extremity supported Standing balance-Leahy Scale: Poor Standing balance comment: leans posteriorly in standing w/ RW and requires mod to max assist +2 to maintain standing, high fall risk                            ADL Overall ADL's : Needs assistance/impaired Eating/Feeding: Supervision/ safety;Sitting;Set up   Grooming: Set up;Supervision/safety;Bed level   Upper Body Bathing: Moderate assistance;Bed level   Lower Body Bathing: Maximal assistance;Bed level   Upper Body Dressing : Bed level;Moderate assistance   Lower Body Dressing: Maximal assistance;Bed level     Toilet Transfer Details (indicate cue type and reason): did not attempt for pt safety, as unable to stand without Max Ax2         Functional mobility during ADLs: +2 for physical assistance;+2 for safety/equipment;Maximal assistance General ADL Comments: Difficult to determine baseline assist required, but pt generally max A for LB ADL  Vision Patient Visual Report: No change from baseline Vision Assessment?: No apparent visual deficits     Perception     Praxis Praxis Praxis tested?: Not tested Praxis-Other Comments: difficult to assess due to cognition     Pertinent Vitals/Pain Pain Assessment: Faces Faces Pain Scale: Hurts even more Pain Location: L hip Pain Descriptors / Indicators: Aching;Grimacing Pain Intervention(s): Limited activity within patient's tolerance;Monitored during session;Repositioned;Ice applied     Hand Dominance     Extremity/Trunk Assessment Upper Extremity Assessment Upper Extremity Assessment: Generalized weakness   Lower Extremity Assessment Lower Extremity Assessment: Defer to PT evaluation;Generalized weakness   Cervical / Trunk Assessment Cervical / Trunk Assessment: Kyphotic   Communication Communication Communication: No difficulties   Cognition Arousal/Alertness: Awake/alert Behavior During Therapy: Anxious Overall Cognitive Status: History of cognitive impairments - at baseline                 General Comments: anxious throughout session, poor safety awareness, unable to consistently follow commands   General Comments       Exercises   Other Exercises Other Exercises: transfer training x4 to improve functional mobility, required frequent cues for hand placement and safe use of RW, unable to achieve upright standing posture on multiple attempts due to pain and apprehension   Shoulder Instructions      Home Living Family/patient expects to be discharged to:: Skilled nursing facility                                 Additional Comments: Was living in ALF prior to admission and daughter reports they are prepared to receive her back once she is well enough after STR      Prior Functioning/Environment Level of Independence: Needs assistance  Gait / Transfers Assistance Needed: daughter reports pt pt was ambulatory at baseline, has RW but didn't use it ADL's / Homemaking Assistance Needed: daughter reports ALF was providing mod-max assist for dressing, bathing and is prepared to offer same level of care after STR            OT Problem List: Decreased  strength;Pain;Decreased cognition;Decreased range of motion;Decreased safety awareness;Decreased activity tolerance;Decreased knowledge of use of DME or AE;Impaired balance (sitting and/or standing)      OT Treatment/Interventions: Self-care/ADL training;Energy conservation;Patient/family education;DME and/or AE instruction    OT Goals(Current goals can be found in the care plan section) Acute Rehab OT Goals Patient Stated Goal: to get better OT Goal Formulation: With patient/family Time For Goal Achievement: 03/20/17 Potential to Achieve Goals: Good  OT Frequency: Min 1X/week   Barriers to D/C:            Co-evaluation PT/OT/SLP Co-Evaluation/Treatment: Yes Reason for Co-Treatment: To address functional/ADL transfers;For patient/therapist safety PT goals addressed during session: Mobility/safety with mobility;Proper use of DME OT goals addressed during session: Proper use of Adaptive equipment and DME;ADL's and self-care      End of Session Equipment Utilized During Treatment: Gait belt;Rolling walker  Activity Tolerance: Patient limited by pain Patient left: in bed;with call bell/phone within reach;with bed alarm set;with family/visitor present;with SCD's reapplied  OT Visit Diagnosis: Other abnormalities of gait and mobility (R26.89);Pain;Other symptoms and signs involving cognitive function;History of falling (Z91.81);Muscle weakness (generalized) (M62.81) Pain - Right/Left: Left Pain - part of body: Hip;Knee                ADL either performed or assessed with clinical judgement  Time: 6789-3810 OT  Time Calculation (min): 35 min Charges:  OT General Charges $OT Visit: 1 Procedure OT Evaluation $OT Eval Moderate Complexity: 1 Procedure OT Treatments $Self Care/Home Management : 8-22 mins G-Codes:     Jeni Salles, MPH, MS, OTR/L ascom [phone] 03/06/17, 3:40 PM

## 2017-03-06 NOTE — Plan of Care (Signed)
Problem: Health Behavior/Discharge Planning: Goal: Ability to manage health-related needs will improve Outcome: Not Progressing Patient not allowing RN to put IV back in for IV fluids. Patient took out her own IV's yesterday. MD aware and will DC fluids.  Deri Fuelling, RN

## 2017-03-06 NOTE — Progress Notes (Signed)
OT Cancellation Note  Patient Details Name: Julia Alvarado MRN: [id number] DOB: 04/18/1941   Cancelled Treatment:    Reason Eval/Treat Not Completed: Other (comment). Order received, chart reviewed. Pt just received lunch, family member in room requesting OT to come back. Will re-attempt OT evaluation at later date/time as schedule allows.  Jeni Salles, MPH, MS, OTR/L ascom [phone] 03/06/17, 1:30 PM

## 2017-03-06 NOTE — Progress Notes (Signed)
[facility] [place] NAME: Julia Alvarado    MR#:  [id number]  DATE OF BIRTH:  05-06-1941  SUBJECTIVE:  CHIEF COMPLAINT:   Chief Complaint  Patient presents with  . Fall   Patient is 76 year old Caucasian female with past medical history significant for history of breast arthritis, breast cancer, gastric esophageal reflux disease, hyperlipidemia, hypertension, hypothyroidism, dementia, who presented to the hospital for left hip pain status post fall. She was not able to be overweight after the fall and was brought to the hospital. She was noted to have left intertrochanteric hip fracture. Orthopedic surgeon consultation was obtained, patient underwent intramedullary rod placement, per Dr. Rudene Christians.  Patient feels well today, denies any pain.  Review of Systems  Unable to perform ROS: Dementia    VITAL SIGNS: Blood pressure (!) 142/56, pulse 85, temperature 98.6 F (37 C), temperature source Oral, resp. rate 18, height 5\' 6"  (1.676 m), weight 59 kg (130 lb), SpO2 93 %.  PHYSICAL EXAMINATION:   GENERAL:  76 y.o.-year-old patient, sitting in the bed comfortable, answers questions appropriately EYES: Pupils equal, round, reactive to light and accommodation. No scleral icterus. Extraocular muscles intact.  HEENT: Head atraumatic, normocephalic. Oropharynx and nasopharynx clear. Oral candidiasis mucosal coating has improved, although erythema remains, scaling around the lips and fissures at lip angles NECK:  Supple, no jugular venous distention. No thyroid enlargement, no tenderness.  LUNGS: Normal breath sounds bilaterally, no wheezing, rales,rhonchi or crepitation. No use of accessory muscles of respiration.  CARDIOVASCULAR: S1, S2 normal. No murmurs, rubs, or gallops.  ABDOMEN: Soft, nontender, nondistended. Bowel sounds present. No organomegaly or mass.  EXTREMITIES: No pedal edema, cyanosis, or clubbing. Left lower extremity proximal  femur is areas swollen, mildly uncomfortable to palpation, but no bruising, no bleeding was noted from incision sites NEUROLOGIC: Cranial nerves II through XII are intact. Muscle strength 5/5 in all extremities. Sensation intact. Gait not checked.  PSYCHIATRIC: The patient is somewhat sleepy, however, able to open her eyes, converses appropriately  SKIN: No obvious rash, lesion, or ulcer.   ORDERS/RESULTS REVIEWED:   CBC  Recent Labs Lab 03/03/17 2156 03/04/17 0342 03/05/17 0353 03/06/17 0401  WBC 12.8* 6.9 7.7  --   HGB 11.0* 10.1* 9.7* 8.1*  HCT 32.9* 29.0* 27.7*  --   PLT 262 218 204  --   MCV 92.1 94.3 94.3  --   MCH 30.9 32.7 33.1  --   MCHC 33.5 34.7 35.1  --   RDW 13.9 14.2 14.1  --    ------------------------------------------------------------------------------------------------------------------  Chemistries   Recent Labs Lab 03/03/17 2156 03/04/17 0342 03/05/17 0353  NA 130* 133* 136  K 3.5 3.3* 3.7  CL 97* 102 104  CO2 25 26 26   GLUCOSE 144* 113* 171*  BUN 8 7 10   CREATININE 0.60 0.56 0.59  CALCIUM 8.6* 8.1* 8.0*  MG  --  1.6* 2.3   ------------------------------------------------------------------------------------------------------------------ estimated creatinine clearance is 57.5 mL/min (by C-G formula based on SCr of 0.59 mg/dL). ------------------------------------------------------------------------------------------------------------------ No results for input(s): TSH, T4TOTAL, T3FREE, THYROIDAB in the last 72 hours.  Invalid input(s): FREET3  Cardiac Enzymes No results for input(s): CKMB, TROPONINI, MYOGLOBIN in the last 168 hours.  Invalid input(s): CK ------------------------------------------------------------------------------------------------------------------ Invalid input(s): POCBNP ---------------------------------------------------------------------------------------------------------------  RADIOLOGY: Dg Hip Operative Unilat W  Or W/o Pelvis Left  Result Date: 03/04/2017 CLINICAL DATA:  LEFT hip fracture EXAM: OPERATIVE LEFT HIP (WITH PELVIS IF PERFORMED) 3 VIEWS TECHNIQUE: Fluoroscopic spot image(s) were  submitted for interpretation post-operatively. COMPARISON:  03/03/2017 FINDINGS: Intramedullary nail fixation of intertrochanteric fracture. Dynamic compression screw and distal locking screw noted. IMPRESSION: Internal fixation of intertrochanteric fracture. Electronically Signed   By: Suzy Bouchard M.D.   On: 03/04/2017 17:39    EKG:  Orders placed or performed during the hospital encounter of 03/03/17  . ED EKG  . ED EKG  . EKG 12-Lead  . EKG 12-Lead    ASSESSMENT AND PLAN:  Active Problems:   Closed intertrochanteric fracture of hip, left, initial encounter ([place])  #1. Closed intertrochanteric hip fracture, left, status post intramedullary rod placement, 03/04/2017 by Dr. Kyla Balzarine, continue pain medications, supportive therapy, IV fluids, discharge to skilled nursing facility in a few days, physical therapist evaluation is pending #2. Hyponatremia, resolved with IV fluid administration, follow intermittently  #3. Hypokalemia, supplemented intravenously, magnesium level was found to be low, supplemented intravenously , normalized  #4. Acute posthemorrhagic anemia,  hemoglobin level has decreased to 8.1 from 11.0 on admission, following closely, transfuse as needed, likely partially rehydration related #5. Leukocytosis, resolved #6. Hyperglycemia, hemoglobin A1c 5.3, no diabetes #7. Agitation, due to underlying dementia, continue Risperdal, which improved  Management plans discussed with the patient, family and they are in agreement.   DRUG ALLERGIES:  Allergies  Allergen Reactions  . Cefdinir Other (See Comments)    Liquid form causes oral thrush  . Codeine Nausea And Vomiting    nausea  . Ibandronic Acid Nausea Only  . Latex Hives  . Nitrofurantoin     Other reaction(s): Vomiting  . Raloxifene  Other (See Comments)    Per pt, "eye doctor said I cannot take" Per pt, "eye doctor said I cannot take" Per pt, "eye doctor said I cannot take"    CODE STATUS:     Code Status Orders        Start     Ordered   03/04/17 0111  Full code  Continuous     03/04/17 0110    Code Status History    Date Active Date Inactive Code Status Order ID Comments User Context   02/03/2017  8:29 PM 02/04/2017  7:26 PM Full Code [id number]  Vaughan Basta, MD Inpatient   12/17/2016  5:13 AM 12/18/2016  3:43 PM Full Code [id number]  Harrie Foreman, MD Inpatient    Advance Directive Documentation     Most Recent Value  Type of Advance Directive  Living will, Healthcare Power of Attorney  Pre-existing out of facility DNR order (yellow form or pink MOST form)  -  "MOST" Form in Place?  -      TOTAL TIME TAKING CARE OF THIS PATIENT 30 minutes minutes.    Theodoro Grist M.D on 03/06/2017 at 2:11 PM  Between 7am to 6pm - Pager - [phone]  After 6pm go to [email or web address] - password [email or web address] [facility]  Office  [phone]  CC: Primary care physician; Valera Castle, MD

## 2017-03-07 LAB — URINALYSIS, COMPLETE (UACMP) WITH MICROSCOPIC
Bilirubin Urine: NEGATIVE
GLUCOSE, UA: NEGATIVE mg/dL
Ketones, ur: 5 mg/dL — AB
Nitrite: NEGATIVE
PROTEIN: NEGATIVE mg/dL
SPECIFIC GRAVITY, URINE: 1.006 (ref 1.005–1.030)
pH: 9 — ABNORMAL HIGH (ref 5.0–8.0)

## 2017-03-07 LAB — TROPONIN I

## 2017-03-07 LAB — HEMOGLOBIN: HEMOGLOBIN: 9.1 g/dL — AB (ref 12.0–16.0)

## 2017-03-07 MED ORDER — PANTOPRAZOLE SODIUM 40 MG PO TBEC
40.0000 mg | DELAYED_RELEASE_TABLET | Freq: Every day | ORAL | Status: DC
  Administered 2017-03-07 – 2017-03-11 (×5): 40 mg via ORAL
  Filled 2017-03-07 (×5): qty 1

## 2017-03-07 MED ORDER — GI COCKTAIL ~~LOC~~
30.0000 mL | Freq: Once | ORAL | Status: AC
  Administered 2017-03-07: 30 mL via ORAL
  Filled 2017-03-07: qty 30

## 2017-03-07 NOTE — Progress Notes (Signed)
Physical Therapy Treatment Patient Details Name: Julia Alvarado MRN: [id number] DOB: 09-Oct-1941 Today's Date: 03/07/2017    History of Present Illness 76 yo female with onset of L hip fracture in a fall at her ALF, resulting in IM nailing.  PMHx:  OA, breast CA, HTN, Lewy body dementia, sacral fracture    PT Comments    General mobility remains max a +2 for bed mobility, standing and transfers.  Overall she voiced less fear today and was able to stand at bedside for an increased amount of time and tolerated transfer to chair.  She did voice pain in LLE and back and pain meds were requested from nursing.  Participated in exercises as described below.   Follow Up Recommendations  SNF     Equipment Recommendations  None recommended by PT    Recommendations for Other Services       Precautions / Restrictions Precautions Precautions: Fall Precaution Comments: WBAT L hip Restrictions Weight Bearing Restrictions: Yes LLE Weight Bearing: Weight bearing as tolerated    Mobility  Bed Mobility Overal bed mobility: Needs Assistance Bed Mobility: Supine to Sit Rolling: Max assist   Supine to sit: Max assist;+2 for physical assistance     General bed mobility comments: does not attempt to help with bed mobility  Transfers Overall transfer level: Needs assistance Equipment used: Rolling walker (2 wheeled) Transfers: Sit to/from Stand Sit to Stand: Max assist;+2 physical assistance Stand pivot transfers: Max assist;+2 physical assistance       General transfer comment: was able to stand today with walker up to 45 seconds.  transfer without walker to recliner at bedside  Ambulation/Gait             General Gait Details: unable to take steps    Stairs            Wheelchair Mobility    Modified Rankin (Stroke Patients Only)       Balance Overall balance assessment: History of Falls;Needs assistance Sitting-balance support: Feet supported;Bilateral  upper extremity supported Sitting balance-Leahy Scale: Fair Sitting balance - Comments: unsafe to sit unattended. Postural control: Right lateral lean;Posterior lean Standing balance support: Bilateral upper extremity supported Standing balance-Leahy Scale: Poor Standing balance comment: improved standing today but continues to need +2 assist                    Cognition Arousal/Alertness: Awake/alert Behavior During Therapy: Anxious Overall Cognitive Status: History of cognitive impairments - at baseline                      Exercises Other Exercises Other Exercises: seated AAROM LLE x 10 for LAQ and marches.    General Comments        Pertinent Vitals/Pain Pain Assessment: Faces Pain Score: 6  Pain Location: L hip/back Pain Descriptors / Indicators: Aching;Grimacing Pain Intervention(s): Patient requesting pain meds-RN notified;Limited activity within patient's tolerance    Home Living                      Prior Function            PT Goals (current goals can now be found in the care plan section) Progress towards PT goals: Progressing toward goals    Frequency    BID      PT Plan Current plan remains appropriate    Co-evaluation             End of Session Equipment  Utilized During Treatment: Gait belt Activity Tolerance: Patient limited by pain;Other (comment) Patient left: in chair;with chair alarm set;with call bell/phone within reach Nurse Communication: Mobility status       Time: 0122-2411 PT Time Calculation (min) (ACUTE ONLY): 23 min  Charges:  $Therapeutic Exercise: 8-22 mins $Therapeutic Activity: 8-22 mins                    G Codes:       Chesley Noon, PTA 03/07/17, 9:58 AM

## 2017-03-07 NOTE — Progress Notes (Signed)
   Subjective: 3 Days Post-Op Procedure(s) (LRB): INTRAMEDULLARY (IM) NAIL FEMORAL (Left) Patient reports pain as mild.   Patient is well, and has had no acute complaints or problems We will continue therapy today.  Plan is to go Rehab after hospital stay. no nausea and no vomiting Patient denies any chest pains or shortness of breath.   Objective: Vital signs in last 24 hours: Temp:  [97.3 F (36.3 C)-98.7 F (37.1 C)] 98.7 F (37.1 C) (03/20 0851) Pulse Rate:  [87-100] 87 (03/20 0851) Resp:  [16-20] 16 (03/20 0851) BP: (117-162)/(45-77) 127/45 (03/20 0851) SpO2:  [96 %-97 %] 97 % (03/20 0851) well approximated incision Heels are non tender and elevated off the bed using rolled towels Intake/Output from previous day: No intake/output data recorded. Intake/Output this shift: No intake/output data recorded.   Recent Labs  03/05/17 0353 03/06/17 0401 03/07/17 0337  HGB 9.7* 8.1* 9.1*    Recent Labs  03/05/17 0353  WBC 7.7  RBC 2.93*  HCT 27.7*  PLT 204    Recent Labs  03/05/17 0353  NA 136  K 3.7  CL 104  CO2 26  BUN 10  CREATININE 0.59  GLUCOSE 171*  CALCIUM 8.0*   No results for input(s): LABPT, INR in the last 72 hours.  EXAM General - Patient is Alert and Confused Extremity - Neurologically intact Neurovascular intact Sensation intact distally Intact pulses distally Dorsiflexion/Plantar flexion intact No cellulitis present Compartment soft Dressing - scant drainage Motor Function - intact, moving foot and toes well on exam.    Past Medical History:  Diagnosis Date  . Arthritis   . Breast cancer ([place])   . Breast cancer ([place])   . GERD (gastroesophageal reflux disease)   . Hyperlipemia   . Hypertension   . Hypothyroidism   . Lewy body dementia without behavioral disturbance   . Sacral fracture ([facility]) 1/15    Assessment/Plan: 3 Days Post-Op Procedure(s) (LRB): INTRAMEDULLARY (IM) NAIL FEMORAL (Left) Active Problems:   Closed  intertrochanteric fracture of hip, left, initial encounter ([place])   Acute post op blood loss anemia   Estimated body mass index is 20.98 kg/m as calculated from the following:   Height as of this encounter: 5\' 6"  (1.676 m).   Weight as of this encounter: 59 kg (130 lb). Advance diet Up with therapy  Needs BM Patient will need to follow-up in [facility] in 6 weeks. Sooner if any problems Continue Lovenox 40 mg daily for 14 days postop Staples are to be removed 2 weeks postop, 03/19/2017 Acute post op blood loss anemia - Hgb trending up Plan on discharge to SNF tomorrow pending medical clearance  DVT Prophylaxis - Lovenox, TED hose and Sequential leg wraps Weight-Bearing as tolerated to left leg      Duanne Guess PA [place] 03/07/2017, 1:06 PM

## 2017-03-07 NOTE — Progress Notes (Signed)
Per MD patient will likely D/C tomorrow. Plan is for patient to D/C to [facility]. [facility] liaison is aware of above. Clinical Social Worker (CSW) will continue to follow and assist as needed.   [employer], LCSW [phone]

## 2017-03-07 NOTE — Progress Notes (Signed)
After GI cocktail, no further c/o chest pain. Tropinin's so far have been 0.03. Will continue to monitor.

## 2017-03-07 NOTE — Progress Notes (Addendum)
Pt family updated on POC. Pt is alert but disoriented. VSS.  Update: Pt pulled off the two distal honeycombs and opened the top honeycomb. Placed new honeycombs on bottom two sites. Top site re-enforced. Will pass on information in report.

## 2017-03-07 NOTE — Progress Notes (Signed)
[facility] at [place] NAME: Julia Alvarado    MR#:  [id number]  DATE OF BIRTH:  Feb 18, 1941  SUBJECTIVE:  CHIEF COMPLAINT:   Chief Complaint  Patient presents with  . Fall   Patient is 76 year old Caucasian female with past medical history significant for history of breast arthritis, breast cancer, gastric esophageal reflux disease, hyperlipidemia, hypertension, hypothyroidism, dementia, who presented to the hospital for left hip pain status post fall. She was not able to be overweight after the fall and was brought to the hospital. She was noted to have left intertrochanteric hip fracture. Orthopedic surgeon consultation was obtained, patient underwent intramedullary rod placement, per Dr. Rudene Christians.  Patient Complains of chest pain, epigastric abdominal pain today, not able to elaborate. EKG showed nonspecific ST-T changes, no signs of MI. Troponin is negative.  Review of Systems  Unable to perform ROS: Dementia    VITAL SIGNS: Blood pressure (!) 127/45, pulse 87, temperature 98.7 F (37.1 C), temperature source Oral, resp. rate 16, height 5\' 6"  (1.676 m), weight 59 kg (130 lb), SpO2 97 %.  PHYSICAL EXAMINATION:   GENERAL:  76 y.o.-year-old patient, sitting in the bed, grimacing, uncomfortable, answers questions appropriately EYES: Pupils equal, round, reactive to light and accommodation. No scleral icterus. Extraocular muscles intact.  HEENT: Head atraumatic, normocephalic. Oropharynx and nasopharynx clear. Oral candidiasis mucosal coating has improved, although erythema remains, scaling around the lips and fissures at lip angles NECK:  Supple, no jugular venous distention. No thyroid enlargement, no tenderness.  LUNGS: Normal breath sounds bilaterally, no wheezing, rales,rhonchi or crepitation. No use of accessory muscles of respiration.  CARDIOVASCULAR: S1, S2 normal. No murmurs, rubs, or gallops.  ABDOMEN: Soft, tender in  epigastric area but no rebound or guarding, nondistended. Bowel sounds present. No organomegaly or mass.  EXTREMITIES: No pedal edema, cyanosis, or clubbing. Left lower extremity proximal femur is areas swollen, mildly uncomfortable to palpation, but no bruising, no bleeding was noted from incision sites NEUROLOGIC: Cranial nerves II through XII are intact. Muscle strength 5/5 in all extremities. Sensation intact. Gait not checked.  PSYCHIATRIC: The patient is alert, answers questions appropriately  SKIN: No obvious rash, lesion, or ulcer.   ORDERS/RESULTS REVIEWED:   CBC  Recent Labs Lab 03/03/17 2156 03/04/17 0342 03/05/17 0353 03/06/17 0401 03/07/17 0337  WBC 12.8* 6.9 7.7  --   --   HGB 11.0* 10.1* 9.7* 8.1* 9.1*  HCT 32.9* 29.0* 27.7*  --   --   PLT 262 218 204  --   --   MCV 92.1 94.3 94.3  --   --   MCH 30.9 32.7 33.1  --   --   MCHC 33.5 34.7 35.1  --   --   RDW 13.9 14.2 14.1  --   --    ------------------------------------------------------------------------------------------------------------------  Chemistries   Recent Labs Lab 03/03/17 2156 03/04/17 0342 03/05/17 0353  NA 130* 133* 136  K 3.5 3.3* 3.7  CL 97* 102 104  CO2 25 26 26   GLUCOSE 144* 113* 171*  BUN 8 7 10   CREATININE 0.60 0.56 0.59  CALCIUM 8.6* 8.1* 8.0*  MG  --  1.6* 2.3   ------------------------------------------------------------------------------------------------------------------ estimated creatinine clearance is 57.5 mL/min (by C-G formula based on SCr of 0.59 mg/dL). ------------------------------------------------------------------------------------------------------------------ No results for input(s): TSH, T4TOTAL, T3FREE, THYROIDAB in the last 72 hours.  Invalid input(s): FREET3  Cardiac Enzymes  Recent Labs Lab 03/07/17 1008  TROPONINI <0.03   ------------------------------------------------------------------------------------------------------------------ Invalid  input(s): POCBNP ---------------------------------------------------------------------------------------------------------------  RADIOLOGY: No results found.  EKG:  Orders placed or performed during the hospital encounter of 03/03/17  . ED EKG  . ED EKG  . EKG 12-Lead  . EKG 12-Lead  . EKG 12-Lead  . EKG 12-Lead  . EKG 12-Lead  . EKG 12-Lead  . EKG 12-Lead  . EKG 12-Lead  . EKG 12-Lead  . EKG 12-Lead    ASSESSMENT AND PLAN:  Active Problems:   Closed intertrochanteric fracture of hip, left, initial encounter ([name])  #1. Closed intertrochanteric hip fracture, left, status post intramedullary rod placement, 03/04/2017 by Dr. Kyla Balzarine, continue pain medications, supportive therapy,  discharge to skilled nursing facility tomorrow, likely  #2. Hyponatremia, resolved with IV fluid administration, follow intermittently  #3. Hypokalemia, supplemented intravenously, magnesium level was found to be low, supplemented intravenously , normalized  #4. Acute posthemorrhagic anemia,  hemoglobin level has decreased to 8.1 from 11.0 on admission, but improved after discontinuation of IV fluids to 9.1, no bleeding noted, follow hemoglobin level in the morning #5. Leukocytosis, resolved #6. Hyperglycemia, hemoglobin A1c 5.3, no diabetes #7. Agitation, due to underlying dementia, continue Risperdal when necessary, improved #8 chest pain, likely gastrointestinal, as patient also has epigastric abdominal pain, EKG was unremarkable, first troponin was negative, following closely, patient was administered GI cocktail, Protonix, reassess in the morning  Management plans discussed with the patient, family and they are in agreement.   DRUG ALLERGIES:  Allergies  Allergen Reactions  . Cefdinir Other (See Comments)    Liquid form causes oral thrush  . Codeine Nausea And Vomiting    nausea  . Ibandronic Acid Nausea Only  . Latex Hives  . Nitrofurantoin     Other reaction(s): Vomiting  . Raloxifene  Other (See Comments)    Per pt, "eye doctor said I cannot take" Per pt, "eye doctor said I cannot take" Per pt, "eye doctor said I cannot take"    CODE STATUS:     Code Status Orders        Start     Ordered   03/04/17 0111  Full code  Continuous     03/04/17 0110    Code Status History    Date Active Date Inactive Code Status Order ID Comments User Context   02/03/2017  8:29 PM 02/04/2017  7:26 PM Full Code [id number]  Vaughan Basta, MD Inpatient   12/17/2016  5:13 AM 12/18/2016  3:43 PM Full Code [id number]  Harrie Foreman, MD Inpatient    Advance Directive Documentation     Most Recent Value  Type of Advance Directive  Living will, Healthcare Power of Attorney  Pre-existing out of facility DNR order (yellow form or pink MOST form)  -  "MOST" Form in Place?  -      TOTAL TIME TAKING CARE OF THIS PATIENT 40 minutes minutes.    Theodoro Grist M.D on 03/07/2017 at 2:28 PM  Between 7am to 6pm - Pager - [phone]  After 6pm go to [email or web address] - password [email or web address] [facility]  Office  [phone]  CC: Primary care physician; Valera Castle, MD

## 2017-03-07 NOTE — Progress Notes (Signed)
Dr. Ether Griffins in to see pt and pt c/o CP. Per her order, EKG obtained per her order and pt given GI cocktail per her order. Dr. Ether Griffins reviewed EKG. Will obtain tropinin's per her order. Vital signs remain stable. Will continue to monitor. No new orders at this time.

## 2017-03-07 NOTE — Progress Notes (Signed)
Physical Therapy Treatment Patient Details Name: Julia Alvarado MRN: [id number] DOB: January 10, 1941 Today's Date: 03/07/2017    History of Present Illness 76 yo female with onset of L hip fracture in a fall at her ALF, resulting in IM nailing.  PMHx:  OA, breast CA, HTN, Lewy body dementia, sacral fracture    PT Comments    Pt in chair about 2 hours this morning.  Pt with c/o back and hip discomfort.  Pt was able to stand with max a x 2 and take a few small steps towards chair.  Before turning fully, pt began to sit and needed Max a x 2/dependant lift back onto the bed.  Max a x 2 for bed mobility.  ALF staff and daughter in room to observe and help encourage patient.  Upon return to be, she c/o urinary pain but attends was dry.  Nurse notified.    Follow Up Recommendations  SNF     Equipment Recommendations  None recommended by PT    Recommendations for Other Services OT consult     Precautions / Restrictions Precautions Precautions: Fall Precaution Comments: WBAT L hip Restrictions Weight Bearing Restrictions: Yes LLE Weight Bearing: Weight bearing as tolerated    Mobility  Bed Mobility Overal bed mobility: Needs Assistance Bed Mobility: Sit to Supine Rolling: Max assist   Supine to sit: Max assist;+2 for physical assistance Sit to supine: Total assist   General bed mobility comments: does not attempt to help with bed mobility  Transfers Overall transfer level: Needs assistance Equipment used: Rolling walker (2 wheeled) Transfers: Sit to/from Stand Sit to Stand: Max assist;+2 physical assistance Stand pivot transfers: Max assist;+2 physical assistance       General transfer comment: was able to stand today with walker up to 45 seconds.  transfer without walker to recliner at bedside  Ambulation/Gait Ambulation/Gait assistance: Max assist;+2 physical assistance Ambulation Distance (Feet): 3 Feet Assistive device: Rolling walker (2 wheeled)       General  Gait Details: took 5 small steps to turn to bed.  Pt unable to turn fully to bed and needed to be lifted back onto the bed by staff.   Stairs            Wheelchair Mobility    Modified Rankin (Stroke Patients Only)       Balance Overall balance assessment: History of Falls;Needs assistance Sitting-balance support: Feet supported;Bilateral upper extremity supported Sitting balance-Leahy Scale: Fair Sitting balance - Comments: unsafe to sit unattended. Postural control: Right lateral lean;Posterior lean Standing balance support: Bilateral upper extremity supported Standing balance-Leahy Scale: Poor Standing balance comment: improved standing today but continues to need +2 assist                    Cognition Arousal/Alertness: Awake/alert Behavior During Therapy: WFL for tasks assessed/performed Overall Cognitive Status: History of cognitive impairments - at baseline                 General Comments: anxious throughout session, poor safety awareness, unable to consistently follow commands    Exercises Other Exercises Other Exercises: seated AAROM LLE x 10 for LAQ and marches.    General Comments        Pertinent Vitals/Pain Pain Assessment: Faces Pain Score: 6  Faces Pain Scale: Hurts even more Pain Location: L hip/back Pain Descriptors / Indicators: Aching;Grimacing Pain Intervention(s): Limited activity within patient's tolerance    Home Living  Prior Function            PT Goals (current goals can now be found in the care plan section) Progress towards PT goals: Progressing toward goals    Frequency    BID      PT Plan Current plan remains appropriate    Co-evaluation             End of Session Equipment Utilized During Treatment: Gait belt Activity Tolerance: Patient limited by pain;Other (comment) Patient left: with call bell/phone within reach;in bed;with bed alarm set;with family/visitor  present Nurse Communication: Mobility status;Other (comment)       Time: 6629-4765 PT Time Calculation (min) (ACUTE ONLY): 9 min  Charges:  $Therapeutic Exercise: 8-22 mins $Therapeutic Activity: 8-22 mins                    G Codes:       Chesley Noon 03/26/17, 11:30 AM

## 2017-03-08 MED ORDER — MEROPENEM-SODIUM CHLORIDE 500 MG/50ML IV SOLR
500.0000 mg | Freq: Three times a day (TID) | INTRAVENOUS | Status: DC
  Administered 2017-03-08 – 2017-03-11 (×8): 500 mg via INTRAVENOUS
  Filled 2017-03-08 (×12): qty 50

## 2017-03-08 MED ORDER — DEXTROSE 5 % IV SOLN
2.0000 g | INTRAVENOUS | Status: DC
Start: 2017-03-09 — End: 2017-03-08

## 2017-03-08 MED ORDER — CEFTRIAXONE SODIUM 2 G IJ SOLR
2.0000 g | Freq: Once | INTRAMUSCULAR | Status: DC
  Filled 2017-03-08: qty 2

## 2017-03-08 MED ORDER — RISAQUAD PO CAPS
1.0000 | ORAL_CAPSULE | Freq: Every day | ORAL | Status: DC
  Administered 2017-03-08 – 2017-03-11 (×4): 1 via ORAL
  Filled 2017-03-08 (×3): qty 1

## 2017-03-08 MED ORDER — SODIUM CHLORIDE 0.9 % IV SOLN: 500.0000 mg | Freq: Three times a day (TID) | INTRAVENOUS | Status: DC

## 2017-03-08 MED ORDER — LEVOFLOXACIN 25 MG/ML PO SOLN
500.0000 mg | Freq: Every day | ORAL | Status: DC
  Filled 2017-03-08 (×2): qty 20

## 2017-03-08 NOTE — Progress Notes (Signed)
Physical Therapy Treatment Patient Details Name: Julia Alvarado MRN: [id number] DOB: 1941-08-31 Today's Date: 03/08/2017    History of Present Illness 76 yo female with onset of L hip fracture in a fall at her ALF, resulting in IM nailing.  PMHx:  OA, breast CA, HTN, Lewy body dementia, sacral fracture    PT Comments    Patient able to initiate short-distance gait training this date, but requires mod/max assist for standing balance, weight shift and L LE limb advancement.  Patient very fearful of falling, progressively agitated with continued efforts/encouragement. Does respond well to son (present in room throughout session). Anticipate more spontaneous recovery of movement as pain improves and patient better able to self-initiate functional activities.    Follow Up Recommendations  SNF     Equipment Recommendations       Recommendations for Other Services       Precautions / Restrictions Precautions Precautions: Fall Precaution Comments: WBAT L hip Restrictions Weight Bearing Restrictions: Yes LLE Weight Bearing: Weight bearing as tolerated    Mobility  Bed Mobility Overal bed mobility: Needs Assistance Bed Mobility: Supine to Sit     Supine to sit: Mod assist     General bed mobility comments: assist for LE management and truncal elevation; does assist with R LE movement and trunk control this date  Transfers Overall transfer level: Needs assistance Equipment used: Rolling walker (2 wheeled) Transfers: Sit to/from Stand Sit to Stand: Max assist;+2 physical assistance         General transfer comment: assist for lift off, postural extension and anterior weight translation with sit/stand efforts; constant cuing for active use of bilat LEs and closed-chain tKE in stance  Ambulation/Gait Ambulation/Gait assistance: Mod assist;Max assist;+2 physical assistance Ambulation Distance (Feet): 10 Feet Assistive device: Rolling walker (2 wheeled)       General  Gait Details: step to gait pattern with RW; max assist for bilat weight shifting to intiate LE limb advancement.  Physical assist from therapist required for L LE advancement; constant encouragement from therapist/son throughout efforts.   Stairs            Wheelchair Mobility    Modified Rankin (Stroke Patients Only)       Balance Overall balance assessment: Needs assistance Sitting-balance support: No upper extremity supported;Feet supported Sitting balance-Leahy Scale: Fair     Standing balance support: Bilateral upper extremity supported Standing balance-Leahy Scale: Poor                      Cognition Arousal/Alertness: Awake/alert Behavior During Therapy: Restless;Agitated;Impulsive Overall Cognitive Status: History of cognitive impairments - at baseline                      Exercises Other Exercises Other Exercises: Sit/stand x3 from edge of bed with RW, mod/max assist +2-emphasis on anterio rweight translation, postural extension    General Comments        Pertinent Vitals/Pain Pain Assessment: Faces Faces Pain Scale: Hurts even more Pain Location: L hip Pain Descriptors / Indicators: Aching;Grimacing;Guarding Pain Intervention(s): Limited activity within patient's tolerance;Monitored during session;Repositioned    Home Living                      Prior Function            PT Goals (current goals can now be found in the care plan section) Acute Rehab PT Goals Patient Stated Goal: to get better PT Goal  Formulation: With patient Time For Goal Achievement: 03/19/17 Potential to Achieve Goals: Fair Progress towards PT goals: Progressing toward goals    Frequency    BID      PT Plan Current plan remains appropriate    Co-evaluation             End of Session Equipment Utilized During Treatment: Gait belt Activity Tolerance: Patient tolerated treatment well;Patient limited by pain Patient left: in chair;with  call bell/phone within reach;with chair alarm set;with family/visitor present   PT Visit Diagnosis: Repeated falls (R29.6);History of falling (Z91.81);Unsteadiness on feet (R26.81);Pain Pain - Right/Left: Left Pain - part of body: Hip     Time: 3643-8377 PT Time Calculation (min) (ACUTE ONLY): 18 min  Charges:  $Therapeutic Activity: 8-22 mins                    G Codes:       Jannie Doyle H. Owens Shark, PT, DPT, NCS 03/08/17, 10:32 AM [phone]

## 2017-03-08 NOTE — Progress Notes (Signed)
   Subjective: 4 Days Post-Op Procedure(s) (LRB): INTRAMEDULLARY (IM) NAIL FEMORAL (Left) Patient reports pain as mild.   Patient is well, and has had no acute complaints or problems We will continue therapy today.  Plan is to go Rehab after hospital stay. no nausea and no vomiting   Objective: Vital signs in last 24 hours: Temp:  [97.5 F (36.4 C)-97.8 F (36.6 C)] 97.8 F (36.6 C) (03/21 0009) Pulse Rate:  [79-81] 81 (03/21 0009) Resp:  [16-20] 20 (03/21 0009) BP: (138-150)/(59-63) 138/63 (03/21 0009) SpO2:  [97 %] 97 % (03/21 0009) well approximated incision Heels are non tender and elevated off the bed using rolled towels Intake/Output from previous day: 03/20 0701 - 03/21 0700 In: -  Out: 60 [Urine:60] Intake/Output this shift: No intake/output data recorded.   Recent Labs  03/06/17 0401 03/07/17 0337  HGB 8.1* 9.1*   No results for input(s): WBC, RBC, HCT, PLT in the last 72 hours. No results for input(s): NA, K, CL, CO2, BUN, CREATININE, GLUCOSE, CALCIUM in the last 72 hours. No results for input(s): LABPT, INR in the last 72 hours.  EXAM General - Patient is Alert and Confused Extremity - Neurologically intact Neurovascular intact Sensation intact distally Intact pulses distally Dorsiflexion/Plantar flexion intact No cellulitis present Compartment soft Dressing - dressing C/D/I and no drainage Motor Function - intact, moving foot and toes well on exam.    Past Medical History:  Diagnosis Date  . Arthritis   . Breast cancer ([place])   . Breast cancer ([facility])   . GERD (gastroesophageal reflux disease)   . Hyperlipemia   . Hypertension   . Hypothyroidism   . Lewy body dementia without behavioral disturbance   . Sacral fracture ([facility]) 1/15    Assessment/Plan: 4 Days Post-Op Procedure(s) (LRB): INTRAMEDULLARY (IM) NAIL FEMORAL (Left) Active Problems:   Closed intertrochanteric fracture of hip, left, initial encounter ([facility])   Acute post op blood loss  anemia   Estimated body mass index is 20.98 kg/m as calculated from the following:   Height as of this encounter: 5\' 6"  (1.676 m).   Weight as of this encounter: 59 kg (130 lb). Advance diet Up with therapy  Needs BM Acute post op blood loss anemia - Hgb trending up Plan on discharge to SNF today. Follow up with [facility] in 6 weeks. Rehab facility to remove staples and apply steri strips on 03/19/17. Continue with Lovenox 40 mg SQ daily X 14 days for DVT prophylaxis  DVT Prophylaxis - Lovenox, TED hose and Sequential leg wraps Weight-Bearing as tolerated to left leg      Surry Canute 03/08/2017, 9:03 AM

## 2017-03-08 NOTE — Progress Notes (Signed)
Pharmacy Antibiotic Note  Julia Alvarado is a 76 y.o. female admitted on 03/03/2017 with UTI.  Pharmacy has been consulted for ceftriaxone dosing.  Plan: Ceftriaxone 2 grams q 24 hours ordered.  Height: 5\' 6"  (167.6 cm) Weight: 130 lb (59 kg) IBW/kg (Calculated) : 59.3  Temp (24hrs), Avg:98 F (36.7 C), Min:97.5 F (36.4 C), Max:98.7 F (37.1 C)   Recent Labs Lab 03/03/17 2156 03/04/17 0342 03/05/17 0353  WBC 12.8* 6.9 7.7  CREATININE 0.60 0.56 0.59    Estimated Creatinine Clearance: 57.5 mL/min (by C-G formula based on SCr of 0.59 mg/dL).    Allergies  Allergen Reactions  . Cefdinir Other (See Comments)    Liquid form causes oral thrush  . Codeine Nausea And Vomiting    nausea  . Ibandronic Acid Nausea Only  . Latex Hives  . Nitrofurantoin     Other reaction(s): Vomiting  . Raloxifene Other (See Comments)    Per pt, "eye doctor said I cannot take" Per pt, "eye doctor said I cannot take" Per pt, "eye doctor said I cannot take"    Antimicrobials this admission: Ceftriaxone 3/21  >>    >>   Dose adjustments this admission:   Microbiology results: 3/20  UCx: pending  2/17 MRSA PCR: (-)   3/20 UA: LE(+) NO2(-) WBC TNTC  Thank you for allowing pharmacy to be a part of this patient's care.  Aireonna Bauer S 03/08/2017 1:14 AM

## 2017-03-08 NOTE — Progress Notes (Signed)
[facility] NAME: Julia Alvarado    MR#:  [id number]  DATE OF BIRTH:  1941-02-15  SUBJECTIVE:  CHIEF COMPLAINT:   Chief Complaint  Patient presents with  . Fall   Patient is 76 year old Caucasian female with past medical history significant for history of breast arthritis, breast cancer, gastric esophageal reflux disease, hyperlipidemia, hypertension, hypothyroidism, dementia, who presented to the hospital for left hip pain status post fall. She was not able to be overweight after the fall and was brought to the hospital. She was noted to have left intertrochanteric hip fracture. Orthopedic surgeon consultation was obtained, patient underwent intramedullary rod placement, per Dr. Rudene Christians.  More confused yesterday, urinalysis revealed significant pyuria, initiated on levofloxacin, which is changed to meropenem due to concerns of resistant Escherichia coli, which was present during prior infections. Patient remains very confused today, denies any chest pain or abdominal pain. Troponins were negative, epigastric discomfort improved with PPI, patient denies any abdominal pain  Review of Systems  Unable to perform ROS: Dementia    VITAL SIGNS: Blood pressure 138/63, pulse 81, temperature 97.8 F (36.6 C), temperature source Oral, resp. rate 20, height 5\' 6"  (1.676 m), weight 59 kg (130 lb), SpO2 97 %.  PHYSICAL EXAMINATION:   GENERAL:  76 y.o.-year-old patient, sitting in the bed, somewhat somnolent, but able to answer a few questions appropriately EYES: Pupils equal, round, reactive to light and accommodation. No scleral icterus. Extraocular muscles intact.  HEENT: Head atraumatic, normocephalic. Oropharynx and nasopharynx clear. Oral candidiasis mucosal coating has improved, although erythema remains, scaling around the lips and fissures at lip angles NECK:  Supple, no jugular venous distention. No thyroid enlargement, no  tenderness.  LUNGS: Normal breath sounds bilaterally, no wheezing, rales,rhonchi or crepitation. No use of accessory muscles of respiration.  CARDIOVASCULAR: S1, S2 normal. No murmurs, rubs, or gallops.  ABDOMEN: Soft, tender in suprapubic area, but no rebound or guarding, nondistended. Bowel sounds present. No organomegaly or mass.  EXTREMITIES: No pedal edema, cyanosis, or clubbing. Left lower extremity proximal femur is areas swollen, mildly uncomfortable to palpation, but no bruising, no bleeding was noted from incision sites NEUROLOGIC: Cranial nerves II through XII are intact. Muscle strength 5/5 in all extremities. Sensation intact. Gait not checked.  PSYCHIATRIC: The patient is alert, answers some questions appropriately  SKIN: No obvious rash, lesion, or ulcer.   ORDERS/RESULTS REVIEWED:   CBC  Recent Labs Lab 03/03/17 2156 03/04/17 0342 03/05/17 0353 03/06/17 0401 03/07/17 0337  WBC 12.8* 6.9 7.7  --   --   HGB 11.0* 10.1* 9.7* 8.1* 9.1*  HCT 32.9* 29.0* 27.7*  --   --   PLT 262 218 204  --   --   MCV 92.1 94.3 94.3  --   --   MCH 30.9 32.7 33.1  --   --   MCHC 33.5 34.7 35.1  --   --   RDW 13.9 14.2 14.1  --   --    ------------------------------------------------------------------------------------------------------------------  Chemistries   Recent Labs Lab 03/03/17 2156 03/04/17 0342 03/05/17 0353  NA 130* 133* 136  K 3.5 3.3* 3.7  CL 97* 102 104  CO2 25 26 26   GLUCOSE 144* 113* 171*  BUN 8 7 10   CREATININE 0.60 0.56 0.59  CALCIUM 8.6* 8.1* 8.0*  MG  --  1.6* 2.3   ------------------------------------------------------------------------------------------------------------------ estimated creatinine clearance is 57.5 mL/min (by C-G formula based on SCr of 0.59 mg/dL). ------------------------------------------------------------------------------------------------------------------  No results for input(s): TSH, T4TOTAL, T3FREE, THYROIDAB in the last 72  hours.  Invalid input(s): FREET3  Cardiac Enzymes  Recent Labs Lab 03/07/17 1008 03/07/17 1408 03/07/17 1720  TROPONINI <0.03 <0.03 <0.03   ------------------------------------------------------------------------------------------------------------------ Invalid input(s): POCBNP ---------------------------------------------------------------------------------------------------------------  RADIOLOGY: No results found.  EKG:  Orders placed or performed during the hospital encounter of 03/03/17  . ED EKG  . ED EKG  . EKG 12-Lead  . EKG 12-Lead  . EKG 12-Lead  . EKG 12-Lead  . EKG 12-Lead  . EKG 12-Lead  . EKG 12-Lead  . EKG 12-Lead  . EKG 12-Lead  . EKG 12-Lead    ASSESSMENT AND PLAN:  Active Problems:   Closed intertrochanteric fracture of hip, left, initial encounter ([facility])  #1. Closed intertrochanteric hip fracture, left, status post intramedullary rod placement, 03/04/2017 by Dr. Kyla Balzarine, continue pain medications, supportive therapy,  discharge to skilled nursing facility when urine cultures are known  #2. Hyponatremia, resolved with IV fluid administration, follow intermittently  #3. Hypokalemia, supplemented intravenously, magnesium level was found to be low, supplemented intravenously , normalized  #4. Acute posthemorrhagic anemia,  hemoglobin level has decreased to 8.1 from 11.0 on admission, but improved after discontinuation of IV fluids to 9.1, no bleeding noted, follow hemoglobin level tomorrow #5. Leukocytosis, resolved #6. Hyperglycemia, hemoglobin A1c 5.3, no diabetes #7. Agitation, due to underlying dementia, continue Risperdal when necessary, improved #8 chest pain, likely gastrointestinal, as patient also had epigastric abdominal pain, EKG was unremarkable, troponins were negative, improved on Protonix #9. Urinary tract infection, initiate patient on meropenem after PICC line is placed due to concerns of multiple antibiotic resistances, likely discharged  to skilled nursing facility when mental status improves  Management plans discussed with the patient, family and they are in agreement.   DRUG ALLERGIES:  Allergies  Allergen Reactions  . Cefdinir Other (See Comments)    Liquid form causes oral thrush  . Codeine Nausea And Vomiting    nausea  . Ibandronic Acid Nausea Only  . Latex Hives  . Nitrofurantoin     Other reaction(s): Vomiting  . Raloxifene Other (See Comments)    Per pt, "eye doctor said I cannot take" Per pt, "eye doctor said I cannot take" Per pt, "eye doctor said I cannot take"    CODE STATUS:     Code Status Orders        Start     Ordered   03/04/17 0111  Full code  Continuous     03/04/17 0110    Code Status History    Date Active Date Inactive Code Status Order ID Comments User Context   02/03/2017  8:29 PM 02/04/2017  7:26 PM Full Code [id number]  Vaughan Basta, MD Inpatient   12/17/2016  5:13 AM 12/18/2016  3:43 PM Full Code [id number]  Harrie Foreman, MD Inpatient    Advance Directive Documentation     Most Recent Value  Type of Advance Directive  Living will, Healthcare Power of Attorney  Pre-existing out of facility DNR order (yellow form or pink MOST form)  -  "MOST" Form in Place?  -      TOTAL TIME TAKING CARE OF THIS PATIENT 40 minutes minutes.  Discussed with patient's son, all questions were answered, he voiced understanding  Crespin Forstrom M.D on 03/08/2017 at 2:35 PM  Between 7am to 6pm - Pager - [phone]  After 6pm go to [email or web address] - password [email or web address] [facility]  [facility]  Office  [phone]  CC: Primary  care physician; Valera Castle, MD

## 2017-03-08 NOTE — Progress Notes (Signed)
Physical Therapy Treatment Patient Details Name: Julia Alvarado MRN: [id number] DOB: Dec 08, 1941 Today's Date: 03/08/2017    History of Present Illness 76 yo female with onset of L hip fracture in a fall at her ALF, resulting in IM nailing.  PMHx:  OA, breast CA, HTN, Lewy body dementia, sacral fracture    PT Comments    Patient sleeping upon arrival to room, but okay to wake per son at bedside.  Max encouragement to participate this PM; patient persistently attempting return to supine throughout session. Completed sit/stand with RW x3, max assist; unable to tolerate static stance >5-10 seconds this date, unable to attempt additional gait/mobility training.    Follow Up Recommendations  SNF     Equipment Recommendations       Recommendations for Other Services       Precautions / Restrictions Precautions Precautions: Fall Precaution Comments: WBAT L hip, contact iso Restrictions Weight Bearing Restrictions: Yes LLE Weight Bearing: Weight bearing as tolerated    Mobility  Bed Mobility Overal bed mobility: Needs Assistance Bed Mobility: Supine to Sit;Sit to Supine     Supine to sit: Mod assist;Max assist Sit to supine: Max assist;Total assist   General bed mobility comments: assist for LE management and truncal elevation; does assist with R LE movement and trunk control this date  Transfers Overall transfer level: Needs assistance Equipment used: Rolling walker (2 wheeled) Transfers: Sit to/from Stand Sit to Stand: Mod assist;Max assist         General transfer comment: increased time, constant verbal cuing to complete.  Unable to tolerate >5-10 seconds due to pain/fatigue; spontaneously sitting despite cuing from therapist and son.  Completed x3 trials  Ambulation/Gait             General Gait Details: unsafe/unable this PM   Stairs            Wheelchair Mobility    Modified Rankin (Stroke Patients Only)       Balance Overall balance  assessment: Needs assistance Sitting-balance support: No upper extremity supported;Feet supported Sitting balance-Leahy Scale: Fair Sitting balance - Comments: min assist secondary to posterior lean with fatigue, persistent attempts to return to supine   Standing balance support: Bilateral upper extremity supported Standing balance-Leahy Scale: Poor                      Cognition Arousal/Alertness: Awake/alert Behavior During Therapy: Restless;Agitated;Impulsive Overall Cognitive Status: History of cognitive impairments - at baseline                      Exercises      General Comments        Pertinent Vitals/Pain Pain Assessment: Faces Faces Pain Scale: Hurts little more Pain Location: L hip Pain Descriptors / Indicators: Aching;Grimacing;Guarding Pain Intervention(s): Limited activity within patient's tolerance;Monitored during session;Repositioned    Home Living                      Prior Function            PT Goals (current goals can now be found in the care plan section) Acute Rehab PT Goals Patient Stated Goal: to get better PT Goal Formulation: With patient Time For Goal Achievement: 03/19/17 Potential to Achieve Goals: Fair Progress towards PT goals: Progressing toward goals    Frequency    BID      PT Plan Current plan remains appropriate    Co-evaluation  End of Session Equipment Utilized During Treatment: Gait belt Activity Tolerance: Patient tolerated treatment well;Patient limited by pain Patient left: in bed;with call bell/phone within reach;with bed alarm set;with family/visitor present   PT Visit Diagnosis: Repeated falls (R29.6);History of falling (Z91.81);Unsteadiness on feet (R26.81);Pain Pain - Right/Left: Left Pain - part of body: Hip     Time: 1517-6160 PT Time Calculation (min) (ACUTE ONLY): 17 min  Charges:  $Therapeutic Activity: 8-22 mins                    G Codes:        Darvin Dials H. Owens Shark, PT, DPT, NCS 03/08/17, 9:34 PM [phone]

## 2017-03-08 NOTE — Progress Notes (Signed)
Per MD patient is not stable for D/C today and may need IV Abx. Clinical Education officer, museum (CSW) made [employer] liaison aware of above. CSW met with patient's son Timmothy Sours and answered his questions about observation and inpatient status. Don had questions about nursing care on the unit and CSW referred him to his nurse who can get in touch with the unit director. Timmothy Sours also had concerns about previous admissions and CSW told him to follow up with patient relations. Timmothy Sours thanked CSW for assistance. Plan is for patient to D/C to [facility] when stable.   [employer], LCSW [phone]

## 2017-03-08 NOTE — Progress Notes (Signed)
Pt pulled out her PICC line. Dr. Jannifer Franklin Notified.

## 2017-03-09 LAB — BASIC METABOLIC PANEL
Anion gap: 7 (ref 5–15)
BUN: 14 mg/dL (ref 6–20)
CALCIUM: 8.8 mg/dL — AB (ref 8.9–10.3)
CHLORIDE: 103 mmol/L (ref 101–111)
CO2: 26 mmol/L (ref 22–32)
Creatinine, Ser: 0.61 mg/dL (ref 0.44–1.00)
GFR calc non Af Amer: >60 mL/min (ref >60)
Glucose, Bld: 99 mg/dL (ref 65–99)
Potassium: 3.7 mmol/L (ref 3.5–5.1)
SODIUM: 136 mmol/L (ref 135–145)

## 2017-03-09 LAB — CBC
HCT: 30.2 % — ABNORMAL LOW (ref 35.0–47.0)
Hemoglobin: 10.2 g/dL — ABNORMAL LOW (ref 12.0–16.0)
MCH: 32 pg (ref 26.0–34.0)
MCHC: 33.9 g/dL (ref 32.0–36.0)
MCV: 94.2 fL (ref 80.0–100.0)
Platelets: 311 10*3/uL (ref 150–440)
RBC: 3.2 MIL/uL — AB (ref 3.80–5.20)
RDW: 14.8 % — ABNORMAL HIGH (ref 11.5–14.5)
WBC: 5.8 10*3/uL (ref 3.6–11.0)

## 2017-03-09 LAB — URINE CULTURE

## 2017-03-09 MED ORDER — RISPERIDONE 0.5 MG PO TBDP
0.5000 mg | ORAL_TABLET | Freq: Two times a day (BID) | ORAL | Status: DC
  Administered 2017-03-09 – 2017-03-11 (×5): 0.5 mg via ORAL
  Filled 2017-03-09 (×6): qty 1

## 2017-03-09 NOTE — Progress Notes (Signed)
Pt alert but confused this shift. Pulled out PICC line. [facility] notified. A representative with [facility] with call with an estimated time for procedure. Surgical dressing is dry and intact. Inconant of urine this shift.

## 2017-03-09 NOTE — Anesthesia Postprocedure Evaluation (Signed)
Anesthesia Post Note  Patient: [facility]  Procedure(s) Performed: Procedure(s) (LRB): INTRAMEDULLARY (IM) NAIL FEMORAL (Left)  Patient location during evaluation: PACU Anesthesia Type: General Level of consciousness: awake and alert Pain management: pain level controlled Vital Signs Assessment: post-procedure vital signs reviewed and stable Respiratory status: spontaneous breathing, nonlabored ventilation, respiratory function stable and patient connected to nasal cannula oxygen Cardiovascular status: blood pressure returned to baseline and stable Postop Assessment: no signs of nausea or vomiting Anesthetic complications: no     Last Vitals:  Vitals:   03/09/17 0006 03/09/17 0810  BP: (!) 120/50 (!) 125/48  Pulse: 86 (!) 105  Resp: 18 18  Temp: 36.7 C 37.1 C    Last Pain:  Vitals:   03/09/17 0810  TempSrc: Oral  PainSc:                  Martha Clan

## 2017-03-09 NOTE — Progress Notes (Signed)
Per MD patient is not stable for D/C today. Plan is for patient to D/C to [facility]. [facility] liaison is aware of above.   [employer], LCSW [phone]

## 2017-03-09 NOTE — Progress Notes (Signed)
Nutrition Follow-up  DOCUMENTATION CODES:   Not applicable  INTERVENTION:  1. Magic cup TID with meals, each supplement provides 290 kcal and 9 grams of protein 2. Goals of Care?  NUTRITION DIAGNOSIS:   Increased nutrient needs related to wound healing as evidenced by estimated needs. -ongoing  GOAL:   Patient will meet greater than or equal to 90% of their needs  -not meeting  MONITOR:   PO intake, I & O's, Labs, Weight trends, Supplement acceptance  REASON FOR ASSESSMENT:   Malnutrition Screening Tool    ASSESSMENT:   Julia [facility] is a 76 y.o. female with a known history of osteoarthritis, breast cancer, GERD, hyperlipidemia, hypertension, hypothyroidism, Louis body dementia  presents to the emergency department for evaluation ofLeft hip pain status post fall.  Continues to be confused.  Did not eat anything for Lunch - states she isn't hungry. Documented PO 10% yesterday for Lunch - nothing else documented No acute complaints otherwise. Labs and medications reviewed: Colace, Senokot, B12, Vitamin C  Diet Order:  DIET SOFT Room service appropriate? Yes; Fluid consistency: Thin  Skin:  Reviewed, no issues  Last BM:  PTA  Height:   Ht Readings from Last 1 Encounters:  03/03/17 5\' 6"  (1.676 m)    Weight:   Wt Readings from Last 1 Encounters:  03/03/17 130 lb (59 kg)    Ideal Body Weight:  59.09 kg  BMI:  Body mass index is 20.98 kg/m.  Estimated Nutritional Needs:   Kcal:  1200-1500 calories  Protein:  59-71 gm  Fluid:  >/= 1.2L  EDUCATION NEEDS:   No education needs identified at this time  Satira Anis. Delainy Mcelhiney, MS, RD LDN Inpatient Clinical Dietitian Pager [phone]

## 2017-03-09 NOTE — Progress Notes (Signed)
   Subjective: 5 Days Post-Op Procedure(s) (LRB): INTRAMEDULLARY (IM) NAIL FEMORAL (Left) Patient reports pain as mild.   Patient is well, and has had no acute complaints or problems. More alert this am. Slightly confused. We will continue therapy today.  Plan is to go Rehab after hospital stay.    Objective: Vital signs in last 24 hours: Temp:  [98 F (36.7 C)-98.7 F (37.1 C)] 98.7 F (37.1 C) (03/22 0810) Pulse Rate:  [86-105] 105 (03/22 0810) Resp:  [18] 18 (03/22 0810) BP: (120-138)/(48-76) 125/48 (03/22 0810) SpO2:  [95 %-98 %] 98 % (03/22 0810) well approximated incision Heels are non tender and elevated off the bed using rolled towels Intake/Output from previous day: 03/21 0701 - 03/22 0700 In: 150 [P.O.:150] Out: -  Intake/Output this shift: No intake/output data recorded.   Recent Labs  03/07/17 0337 03/09/17 0552  HGB 9.1* 10.2*    Recent Labs  03/09/17 0552  WBC 5.8  RBC 3.20*  HCT 30.2*  PLT 311    Recent Labs  03/09/17 0552  NA 136  K 3.7  CL 103  CO2 26  BUN 14  CREATININE 0.61  GLUCOSE 99  CALCIUM 8.8*   No results for input(s): LABPT, INR in the last 72 hours.  EXAM General - Patient is Alert and Confused Extremity - Neurologically intact Neurovascular intact Sensation intact distally Intact pulses distally Dorsiflexion/Plantar flexion intact No cellulitis present Compartment soft Dressing - dressing C/D/I and no drainage Motor Function - intact, moving foot and toes well on exam.    Past Medical History:  Diagnosis Date  . Arthritis   . Breast cancer ([place])   . Breast cancer ([place])   . GERD (gastroesophageal reflux disease)   . Hyperlipemia   . Hypertension   . Hypothyroidism   . Lewy body dementia without behavioral disturbance   . Sacral fracture ([facility]) 1/15    Assessment/Plan: 5 Days Post-Op Procedure(s) (LRB): INTRAMEDULLARY (IM) NAIL FEMORAL (Left) Active Problems:   Closed intertrochanteric fracture of hip,  left, initial encounter ([place])   Acute post op blood loss anemia   Estimated body mass index is 20.98 kg/m as calculated from the following:   Height as of this encounter: 5\' 6"  (1.676 m).   Weight as of this encounter: 59 kg (130 lb). Advance diet Up with therapy  Needs BM Acute post op blood loss anemia - Hgb trending up 10.2 Plan on discharge to SNF today pending medical clearance.  Follow up with [facility] in 6 weeks.  Rehab facility to remove staples and apply steri strips on 03/19/17. Continue with Lovenox 40 mg SQ daily X 14 days for DVT prophylaxis  DVT Prophylaxis - Lovenox, TED hose and Sequential leg wraps Weight-Bearing as tolerated to left leg      [place] 03/09/2017, 8:53 AM

## 2017-03-09 NOTE — Progress Notes (Signed)
Physical Therapy Treatment Patient Details Name: [facility] MRN: [id number] DOB: 1941-06-17 Today's Date: 03/09/2017    History of Present Illness 76 yo female with onset of L hip fracture in a fall at her ALF, resulting in IM nailing.  PMHx:  OA, breast CA, HTN, Lewy body dementia, sacral fracture    PT Comments    Participated in exercises as described below.  Pt with max a x 2 for all mobility activities today.  General increase in agitation with mobility and once in chair.  Nurse notified and bringing in pain medication for pt.  Limited by cognition and overall agitation today.        Follow Up Recommendations  SNF     Equipment Recommendations  None recommended by PT    Recommendations for Other Services       Precautions / Restrictions Precautions Precautions: Fall Precaution Comments: WBAT L hip, contact iso Restrictions Weight Bearing Restrictions: Yes LLE Weight Bearing: Weight bearing as tolerated    Mobility  Bed Mobility Overal bed mobility: Needs Assistance Bed Mobility: Supine to Sit;Sit to Supine     Supine to sit: Max assist;+2 for physical assistance     General bed mobility comments: did not asssit with transfer this date  Transfers Overall transfer level: Needs assistance Equipment used: Rolling walker (2 wheeled)     Stand pivot transfers: Max assist;+2 physical assistance       General transfer comment: spontaneously sitting during transfer  Ambulation/Gait Ambulation/Gait assistance: Max assist;+2 physical assistance Ambulation Distance (Feet): 2 Feet Assistive device: Rolling walker (2 wheeled)       General Gait Details: unsafe/unable   Stairs            Wheelchair Mobility    Modified Rankin (Stroke Patients Only)       Balance Overall balance assessment: Needs assistance Sitting-balance support: No upper extremity supported;Feet supported Sitting balance-Leahy Scale: Fair       Standing  balance-Leahy Scale: Poor Standing balance comment: resisting standing                    Cognition Arousal/Alertness: Awake/alert Behavior During Therapy: Agitated;Restless Overall Cognitive Status: History of cognitive impairments - at baseline                 General Comments: anxious throughout session, poor safety awareness, unable to consistently follow commands    Exercises Other Exercises Other Exercises: LLE AAROM for heel slides, ab/ad, slr x 5 to tolerance in supine,  sitting LAQ and marches AAROM x 10    General Comments        Pertinent Vitals/Pain Pain Assessment: Faces Faces Pain Scale: Hurts whole lot Pain Location: L hip, back Pain Descriptors / Indicators: Aching;Grimacing;Guarding Pain Intervention(s): Limited activity within patient's tolerance;Patient requesting pain meds-RN notified    Home Living                      Prior Function            PT Goals (current goals can now be found in the care plan section)      Frequency    BID      PT Plan Current plan remains appropriate    Co-evaluation             End of Session Equipment Utilized During Treatment: Gait belt Activity Tolerance: Patient limited by pain;Treatment limited secondary to agitation Patient left: in chair;with chair alarm set;with call bell/phone  within reach;with family/visitor present Nurse Communication: Mobility status;Patient requests pain meds Pain - Right/Left: Left Pain - part of body: Hip     Time: 0912-0924 PT Time Calculation (min) (ACUTE ONLY): 12 min  Charges:  $Therapeutic Activity: 8-22 mins                    G Codes:       Chesley Noon, PTA 03/09/17, 9:46 AM

## 2017-03-09 NOTE — Progress Notes (Signed)
[facility] [place] NAME: Julia [facility]    MR#:  [id number]  DATE OF BIRTH:  [birth year]  SUBJECTIVE:  CHIEF COMPLAINT:   Chief Complaint  Patient presents with  . Fall   Patient is 76 year old Caucasian female with past medical history significant for history of breast arthritis, breast cancer, gastric esophageal reflux disease, hyperlipidemia, hypertension, hypothyroidism, dementia, who presented to the hospital for left hip pain status post fall. She was not able to be overweight after the fall and was brought to the hospital. She was noted to have left intertrochanteric hip fracture. Orthopedic surgeon consultation was obtained, patient underwent intramedullary rod placement, per Dr. Rudene Christians.  More confused yesterday, urinalysis revealed significant pyuria, initiated on levofloxacin, which is changed to meropenem due to concerns of resistant Escherichia coli, which was present during prior infections. Patient remains confused today, denies any chest pain or abdominal pain. Troponins were negative, epigastric discomfort improved with PPI, patient denies any abdominal pain. Urine cultures revealed multiple organisms, recollection was suggested. Patient was very confused yesterday, without PICC line.  Review of Systems  Unable to perform ROS: Dementia    VITAL SIGNS: Blood pressure (!) 125/48, pulse (!) 105, temperature 98.7 F (37.1 C), temperature source Oral, resp. rate 18, height 5\' 6"  (1.676 m), weight 59 kg (130 lb), SpO2 98 %.  PHYSICAL EXAMINATION:   GENERAL:  76 y.o.-year-old patient, sitting in the bed, somewhat somnolent, but able to answer a few questions appropriately, confused, inappropriate EYES: Pupils equal, round, reactive to light and accommodation. No scleral icterus. Extraocular muscles intact.  HEENT: Head atraumatic, normocephalic. Oropharynx and nasopharynx clear.  NECK:  Supple, no jugular venous distention.  No thyroid enlargement, no tenderness.  LUNGS: Normal breath sounds bilaterally, no wheezing, rales,rhonchi or crepitation. No use of accessory muscles of respiration.  CARDIOVASCULAR: S1, S2 normal. No murmurs, rubs, or gallops.  ABDOMEN: Soft, nontender, nondistended. Bowel sounds present. No organomegaly or mass.  EXTREMITIES: No pedal edema, cyanosis, or clubbing. Left lower extremity proximal femur is areas swollen, mildly uncomfortable to palpation, but no bruising, no bleeding was noted from incision sites NEUROLOGIC: Cranial nerves II through XII are intact. Muscle strength 5/5 in all extremities. Sensation intact. Gait not checked.  PSYCHIATRIC: The patient is alert, answers some questions appropriately  SKIN: No obvious rash, lesion, or ulcer.   ORDERS/RESULTS REVIEWED:   CBC  Recent Labs Lab 03/03/17 2156 03/04/17 0342 03/05/17 0353 03/06/17 0401 03/07/17 0337 03/09/17 0552  WBC 12.8* 6.9 7.7  --   --  5.8  HGB 11.0* 10.1* 9.7* 8.1* 9.1* 10.2*  HCT 32.9* 29.0* 27.7*  --   --  30.2*  PLT 262 218 204  --   --  311  MCV 92.1 94.3 94.3  --   --  94.2  MCH 30.9 32.7 33.1  --   --  32.0  MCHC 33.5 34.7 35.1  --   --  33.9  RDW 13.9 14.2 14.1  --   --  14.8*   ------------------------------------------------------------------------------------------------------------------  Chemistries   Recent Labs Lab 03/03/17 2156 03/04/17 0342 03/05/17 0353 03/09/17 0552  NA 130* 133* 136 136  K 3.5 3.3* 3.7 3.7  CL 97* 102 104 103  CO2 25 26 26 26   GLUCOSE 144* 113* 171* 99  BUN 8 7 10 14   CREATININE 0.60 0.56 0.59 0.61  CALCIUM 8.6* 8.1* 8.0* 8.8*  MG  --  1.6* 2.3  --    ------------------------------------------------------------------------------------------------------------------  estimated creatinine clearance is 57.5 mL/min (by C-G formula based on SCr of 0.61  mg/dL). ------------------------------------------------------------------------------------------------------------------ No results for input(s): TSH, T4TOTAL, T3FREE, THYROIDAB in the last 72 hours.  Invalid input(s): FREET3  Cardiac Enzymes  Recent Labs Lab 03/07/17 1008 03/07/17 1408 03/07/17 1720  TROPONINI <0.03 <0.03 <0.03   ------------------------------------------------------------------------------------------------------------------ Invalid input(s): POCBNP ---------------------------------------------------------------------------------------------------------------  RADIOLOGY: No results found.  EKG:  Orders placed or performed during the hospital encounter of 03/03/17  . ED EKG  . ED EKG  . EKG 12-Lead  . EKG 12-Lead  . EKG 12-Lead  . EKG 12-Lead  . EKG 12-Lead  . EKG 12-Lead  . EKG 12-Lead  . EKG 12-Lead  . EKG 12-Lead  . EKG 12-Lead    ASSESSMENT AND PLAN:  Active Problems:   Closed intertrochanteric fracture of hip, left, initial encounter ([place])  #1. Closed intertrochanteric hip fracture, left, status post intramedullary rod placement, 03/04/2017 by Dr. Kyla Balzarine, continue pain medications, supportive therapy,  discharge to skilled nursing facility when urine cultures are known, , unfortunately, we will need to reculture urine again, as it was contaminated culture  #2. Hyponatremia, resolved with IV fluid administration, follow intermittently  #3. Hypokalemia, supplemented intravenously, magnesium level was found to be low, supplemented intravenously , normalized  #4. Acute posthemorrhagic anemia,  hemoglobin level has decreased to 8.1 from 11.0 on admission, but improved after discontinuation of IV fluids to 9.1, 10.2 today, no bleeding noted, follow hemoglobin level as outpatient #5. Leukocytosis, resolved #6. Hyperglycemia, hemoglobin A1c 5.3, no diabetes #7. Agitation, due to underlying dementia, continue Risperdal when necessary, improved #8 chest  pain, likely gastrointestinal, as patient also had epigastric abdominal pain, EKG was unremarkable, troponins were negative, improved on Protonix #9. Urinary tract infection, continue patient on meropenem , status post PICC line placement, 03/08/2017, repeat out by patient the same day, a line was placed due to concerns of multiple antibiotic resistances, likely discharge to skilled nursing facility when mental status improves, urine cultures unknown, now, we will need to reculture her urine due to contamination, which may delay our discharge  Management plans discussed with the patient, family and they are in agreement.   DRUG ALLERGIES:  Allergies  Allergen Reactions  . Cefdinir Other (See Comments)    Liquid form causes oral thrush  . Codeine Nausea And Vomiting    nausea  . Ibandronic Acid Nausea Only  . Latex Hives  . Nitrofurantoin     Other reaction(s): Vomiting  . Raloxifene Other (See Comments)    Per pt, "eye doctor said I cannot take" Per pt, "eye doctor said I cannot take" Per pt, "eye doctor said I cannot take"    CODE STATUS:     Code Status Orders        Start     Ordered   03/04/17 0111  Full code  Continuous     03/04/17 0110    Code Status History    Date Active Date Inactive Code Status Order ID Comments User Context   02/03/2017  8:29 PM 02/04/2017  7:26 PM Full Code [id number]  Vaughan Basta, MD Inpatient   12/17/2016  5:13 AM 12/18/2016  3:43 PM Full Code [id number]  Harrie Foreman, MD Inpatient    Advance Directive Documentation     Most Recent Value  Type of Advance Directive  Living will, Healthcare Power of Attorney  Pre-existing out of facility DNR order (yellow form or pink MOST form)  -  "MOST" Form in Place?  -  TOTAL TIME TAKING CARE OF THIS PATIENT 63 minutes   Davita Sublett M.D on 03/09/2017 at 2:39 PM  Between 7am to 6pm - Pager - [phone]  After 6pm go to [email or web address] - password [email or web address]  Office  [phone]  CC: Primary care physician; [place], MD

## 2017-03-09 NOTE — Progress Notes (Signed)
Physical Therapy Treatment Patient Details Name: Julia [facility] MRN: [id number] DOB: Jun 03, 1941 Today's Date: 03/09/2017    History of Present Illness 76 yo female with onset of L hip fracture in a fall at her ALF, resulting in IM nailing.  PMHx:  OA, breast CA, HTN, Lewy body dementia, sacral fracture    PT Comments    Pt had returned to bed with nursing staff.  LE AAROM in supine as described below.  Pt with increased agitation today limiting session.  Responded well this session with slow movements and reassurance.     Follow Up Recommendations  SNF     Equipment Recommendations  None recommended by PT    Recommendations for Other Services       Precautions / Restrictions Precautions Precautions: Fall Precaution Comments: WBAT L hip, contact iso Restrictions Weight Bearing Restrictions: Yes LLE Weight Bearing: Weight bearing as tolerated    Mobility  Bed Mobility Overal bed mobility: Needs Assistance Bed Mobility: Supine to Sit;Sit to Supine     Supine to sit: Max assist;+2 for physical assistance     General bed mobility comments: did not asssit with transfer this date  Transfers Overall transfer level: Needs assistance Equipment used: Rolling walker (2 wheeled)     Stand pivot transfers: Max assist;+2 physical assistance       General transfer comment: spontaneously sitting during transfer  Ambulation/Gait Ambulation/Gait assistance: Max assist;+2 physical assistance Ambulation Distance (Feet): 2 Feet Assistive device: Rolling walker (2 wheeled)       General Gait Details: unsafe/unable   Stairs            Wheelchair Mobility    Modified Rankin (Stroke Patients Only)       Balance Overall balance assessment: Needs assistance Sitting-balance support: No upper extremity supported;Feet supported Sitting balance-Leahy Scale: Fair       Standing balance-Leahy Scale: Poor Standing balance comment: resisting standing                     Cognition Arousal/Alertness: Awake/alert Behavior During Therapy: Agitated;Restless Overall Cognitive Status: History of cognitive impairments - at baseline                 General Comments: anxious throughout session, poor safety awareness, unable to consistently follow commands    Exercises Other Exercises Other Exercises: BLE AAROM x 10 in supine for ankle pumps, heel slides, ab/ad, slr    General Comments        Pertinent Vitals/Pain Pain Assessment: Faces Faces Pain Scale: Hurts whole lot Pain Location: L hip, back Pain Descriptors / Indicators: Aching;Grimacing;Guarding Pain Intervention(s): Limited activity within patient's tolerance    Home Living                      Prior Function            PT Goals (current goals can now be found in the care plan section) Progress towards PT goals: Progressing toward goals    Frequency    BID      PT Plan Current plan remains appropriate    Co-evaluation             End of Session Equipment Utilized During Treatment: Gait belt Activity Tolerance: Patient limited by pain;Treatment limited secondary to agitation Patient left: in bed;with bed alarm set;with call bell/phone within reach Nurse Communication: Mobility status;Patient requests pain meds Pain - Right/Left: Left Pain - part of body: Hip  Time: 1130-1140 PT Time Calculation (min) (ACUTE ONLY): 10 min  Charges:  $Therapeutic Exercise: 8-22 mins $Therapeutic Activity: 8-22 mins                    G Codes:       Chesley Noon 03/13/17, 11:54 AM

## 2017-03-10 ENCOUNTER — Inpatient Hospital Stay: Payer: Medicare Other

## 2017-03-10 LAB — URINE CULTURE: CULTURE: NO GROWTH

## 2017-03-10 MED ORDER — LIDOCAINE 5 % EX OINT
TOPICAL_OINTMENT | CUTANEOUS | Status: DC | PRN
  Filled 2017-03-10: qty 35.44

## 2017-03-10 NOTE — Progress Notes (Signed)
   Subjective: 6 Days Post-Op Procedure(s) (LRB): INTRAMEDULLARY (IM) NAIL FEMORAL (Left) Patient reports pain as mild. Complaining of back pain yesterday. Patient states pain improved today.  Patient is well, and has had no acute complaints or problems. More alert this am.  We will continue therapy today.  Plan is to go Rehab after hospital stay.    Objective: Vital signs in last 24 hours: Temp:  [98.4 F (36.9 C)-98.7 F (37.1 C)] 98.5 F (36.9 C) (03/23 0733) Pulse Rate:  [80-106] 80 (03/23 0733) Resp:  [16-18] 18 (03/23 0733) BP: (121-134)/(51-74) 134/55 (03/23 0733) SpO2:  [96 %-98 %] 98 % (03/23 0733) well approximated incision Heels are non tender and elevated off the bed using rolled towels Intake/Output from previous day: 03/22 0701 - 03/23 0700 In: 200 [IV Piggyback:200] Out: -  Intake/Output this shift: No intake/output data recorded.   Recent Labs  03/09/17 0552  HGB 10.2*    Recent Labs  03/09/17 0552  WBC 5.8  RBC 3.20*  HCT 30.2*  PLT 311    Recent Labs  03/09/17 0552  NA 136  K 3.7  CL 103  CO2 26  BUN 14  CREATININE 0.61  GLUCOSE 99  CALCIUM 8.8*   No results for input(s): LABPT, INR in the last 72 hours.  EXAM General - Patient is Alert and Confused Extremity - Neurologically intact Neurovascular intact Sensation intact distally Intact pulses distally Dorsiflexion/Plantar flexion intact No cellulitis present Compartment soft  Lumbar thoracic spine- non tender to palpation along spinous process. Patient tolerated rolling over in bed well with no grimacing or signs of pain. Dressing - dressing C/D/I and no drainage Motor Function - intact, moving foot and toes well on exam.    Past Medical History:  Diagnosis Date  . Arthritis   . Breast cancer ([place])   . Breast cancer ([place])   . GERD (gastroesophageal reflux disease)   . Hyperlipemia   . Hypertension   . Hypothyroidism   . Lewy body dementia without behavioral  disturbance   . Sacral fracture ([facility]) 1/15    Assessment/Plan: 6 Days Post-Op Procedure(s) (LRB): INTRAMEDULLARY (IM) NAIL FEMORAL (Left) Active Problems:   Closed intertrochanteric fracture of hip, left, initial encounter ([place])   Acute post op blood loss anemia   Estimated body mass index is 20.98 kg/m as calculated from the following:   Height as of this encounter: 5\' 6"  (1.676 m).   Weight as of this encounter: 59 kg (130 lb). Advance diet Up with therapy  Needs BM Thoracic spine xrays negative for acute bony abnormality Acute post op blood loss anemia - Hgb trending up 10.2 Plan on discharge to SNF pending medical clearance.  Follow up with [facility] in 6 weeks.  Rehab facility to remove staples and apply steri strips on 03/19/17. Continue with Lovenox 40 mg SQ daily X 14 days for DVT prophylaxis  DVT Prophylaxis - Lovenox, TED hose and Sequential leg wraps Weight-Bearing as tolerated to left leg      [place] 03/10/2017, 11:42 AM

## 2017-03-10 NOTE — Progress Notes (Signed)
Pt alert, confused and agitated this shift. Honeycomb dressing replaced after pt pulled it off. Incision well approximated staples in place.  Midline still in place early morning antibiotic infused without difficulty. Milk of mag given for bowels this morning. Tylenol given for paint. Eating and drinking without difficulty.

## 2017-03-10 NOTE — Progress Notes (Signed)
Per MD patient is not medically stable for D/C today. Per [facility] liaison patient can come to [facility] over the weekend if medically stable. Plan is for patient to D/C to [facility]. Clinical social worker (CSW) will continue to follow and assist as needed.   [employer], LCSW [phone]

## 2017-03-10 NOTE — Progress Notes (Signed)
qPhysical Therapy Treatment Patient Details Name: Julia Alvarado MRN: [id number] DOB: [birth year] Today's Date: 03/10/2017    History of Present Illness 76 yo female with onset of L hip fracture in a fall at her ALF, resulting in IM nailing.  PMHx:  OA, breast CA, HTN, Lewy body dementia, sacral fracture    PT Comments    Patient continues to require constant encouragement, redirection to task throughout session.  Poor awareness of situation and role of therapy.  Generally restless (not agitated), constantly verbalizing "help me, help me" throughout session. Mod/max assist for all functional activities; poor balance.  Extreme fear of falling. Persistent complaints of L hip pain, back pain; RN informed/aware and to administer meds as appropriate.  MD in for rounds during session; ordered thoracic x-ray to rule out compression fracture. Will change treatment frequency from BID to QD, as activity appears to agitate patient to some degree; unable to fully participate/progress with BID treatment frequency.    Follow Up Recommendations  SNF     Equipment Recommendations       Recommendations for Other Services       Precautions / Restrictions Precautions Precautions: Fall Precaution Comments: WBAT L hip, contact iso Restrictions Weight Bearing Restrictions: Yes LLE Weight Bearing: Weight bearing as tolerated    Mobility  Bed Mobility Overal bed mobility: Needs Assistance Bed Mobility: Supine to Sit     Supine to sit: Mod assist;Max assist     General bed mobility comments: heavy use of bedrails; max assist for LE management  Transfers Overall transfer level: Needs assistance Equipment used: Rolling walker (2 wheeled) Transfers: Sit to/from Stand Sit to Stand: Mod assist;+2 physical assistance         General transfer comment: improved active effort with sit/stand this date; step by step cuing, max encouragement and redirection to  task  Ambulation/Gait Ambulation/Gait assistance: Mod assist;+2 physical assistance Ambulation Distance (Feet): 10 Feet Assistive device: Rolling walker (2 wheeled)       General Gait Details: step to gait pattern with decreased L LE stance time, weight acceptance; moderately antalgic.  Max assist for bilat weight shifting, but able to indep advance L LE this date.   Stairs            Wheelchair Mobility    Modified Rankin (Stroke Patients Only)       Balance Overall balance assessment: Needs assistance Sitting-balance support: No upper extremity supported;Feet supported Sitting balance-Leahy Scale: Fair Sitting balance - Comments: min assist progressing to close sup with accommodation to position   Standing balance support: Bilateral upper extremity supported Standing balance-Leahy Scale: Poor                              Cognition Arousal/Alertness: Awake/alert Behavior During Therapy: WFL for tasks assessed/performed;Agitated Overall Cognitive Status: History of cognitive impairments - at baseline                                        Exercises Other Exercises Other Exercises: Sit/stand from edge of bed, recliner with RW, mod assist +2 for lift off, standing balacne and anterior weight translation Other Exercises: Unsupported sitting edge of bed, participated with L UE dynamic reaching (act assist) to promote anterior weight translation, lumbar extension with functinoal mobility.  Limited tolerance for activity due to back pain.    General Comments  Pertinent Vitals/Pain Pain Assessment: Faces Faces Pain Scale: Hurts even more Pain Location: L hip, back Pain Descriptors / Indicators: Aching;Grimacing;Guarding Pain Intervention(s): Repositioned;Limited activity within patient's tolerance;Monitored during session;Patient requesting pain meds-RN notified    Home Living                      Prior Function             PT Goals (current goals can now be found in the care plan section) Acute Rehab PT Goals Patient Stated Goal: to get better PT Goal Formulation: With patient Time For Goal Achievement: 03/19/17 Potential to Achieve Goals: Fair Progress towards PT goals: Progressing toward goals    Frequency    7X/week      PT Plan Frequency needs to be updated    Co-evaluation             End of Session Equipment Utilized During Treatment: Gait belt Activity Tolerance: Patient limited by pain Patient left: in bed;with call bell/phone within reach;with bed alarm set;with nursing/sitter in room Nurse Communication: Mobility status;Patient requests pain meds PT Visit Diagnosis: Repeated falls (R29.6);History of falling (Z91.81);Unsteadiness on feet (R26.81);Pain Pain - Right/Left: Left Pain - part of body: Hip     Time: 0912-0939 PT Time Calculation (min) (ACUTE ONLY): 27 min  Charges:  $Gait Training: 8-22 mins $Therapeutic Activity: 8-22 mins                    G Codes:      Solangel Mcmanaway H. Owens Shark, PT, DPT, NCS 03/10/17, 1:10 PM [phone]

## 2017-03-10 NOTE — Plan of Care (Signed)
Problem: Safety: Goal: Ability to remain free from injury will improve Outcome: Progressing Free from falls this shift.  Problem: Pain Managment: Goal: General experience of comfort will improve Outcome: Progressing Pain control with oral pain medication.  Problem: Skin Integrity: Goal: Risk for impaired skin integrity will decrease Outcome: Progressing Pt repositioned in the bed. Surgical dressing is dry and intact.  Problem: Nutrition: Goal: Adequate nutrition will be maintained Outcome: Progressing Eating and drinking with assistance.  Problem: Education: Goal: Knowledge of treatment and prevention of UTI/Pyleonephritis will improve Outcome: Progressing Pt is very confuse and not able to understand the signs and symptoms of urinary tract infection. Pt currently being treated with iv antibiotics for UTI.

## 2017-03-10 NOTE — Progress Notes (Signed)
[facility] NAME: Julia Alvarado    MR#:  [id number]  DATE OF BIRTH:  1941-12-07  SUBJECTIVE:  CHIEF COMPLAINT:   Chief Complaint  Patient presents with  . Fall   Patient is 76 year old Caucasian female with past medical history significant for history of breast arthritis, breast cancer, gastric esophageal reflux disease, hyperlipidemia, hypertension, hypothyroidism, dementia, who presented to the hospital for left hip pain status post fall. She was not able to be overweight after the fall and was brought to the hospital. She was noted to have left intertrochanteric hip fracture. Orthopedic surgeon consultation was obtained, patient underwent intramedullary rod placement, per Dr. Rudene Christians.  More confused yesterday, urinalysis revealed significant pyuria, initiated on levofloxacin, which is changed to meropenem due to concerns of resistant Escherichia coli, which was present during prior infections. Patient remains confused today, denies any chest pain or abdominal pain. Troponins were negative, epigastric discomfort improved with PPI, patient denies any abdominal pain. Urine cultures revealed multiple organisms, recollection was suggested. Patient Very uncomfortable today, complains of missed cervical and thoracic chest pain, palpation reveals tenderness in mid thoracic area. X-ray, however, is unremarkable, no obvious acute compression fractures were noted.   Review of Systems  Unable to perform ROS: Dementia  Complains of chest pain and back pain  VITAL SIGNS: Blood pressure (!) 134/55, pulse 80, temperature 98.5 F (36.9 C), temperature source Oral, resp. rate 18, height 5\' 6"  (1.676 m), weight 59 kg (130 lb), SpO2 98 %.  PHYSICAL EXAMINATION:   GENERAL:  76 y.o.-year-old patient, sitting in the bed, uncomfortable, anxious EYES: Pupils equal, round, reactive to light and accommodation. No scleral icterus. Extraocular muscles  intact.  HEENT: Head atraumatic, normocephalic. Oropharynx and nasopharynx clear.  NECK:  Supple, no jugular venous distention. No thyroid enlargement, no tenderness.  LUNGS: Normal breath sounds bilaterally, no wheezing, rales,rhonchi or crepitation. No use of accessory muscles of respiration. Mid thoracic spine palpation is tender CARDIOVASCULAR: S1, S2 normal. No murmurs, rubs, or gallops.  ABDOMEN: Soft, nontender, nondistended. Bowel sounds present. No organomegaly or mass.  EXTREMITIES: No pedal edema, cyanosis, or clubbing. Left lower extremity proximal femur is areas swollen, mildly uncomfortable to palpation, but no bruising, no bleeding was noted from incision sites NEUROLOGIC: Cranial nerves II through XII are intact. Muscle strength 5/5 in all extremities. Sensation intact. Gait not checked.  PSYCHIATRIC: The patient is alert, answers questions appropriately , anxious affect SKIN: No obvious rash, lesion, or ulcer.   ORDERS/RESULTS REVIEWED:   CBC  Recent Labs Lab 03/03/17 2156 03/04/17 0342 03/05/17 0353 03/06/17 0401 03/07/17 0337 03/09/17 0552  WBC 12.8* 6.9 7.7  --   --  5.8  HGB 11.0* 10.1* 9.7* 8.1* 9.1* 10.2*  HCT 32.9* 29.0* 27.7*  --   --  30.2*  PLT 262 218 204  --   --  311  MCV 92.1 94.3 94.3  --   --  94.2  MCH 30.9 32.7 33.1  --   --  32.0  MCHC 33.5 34.7 35.1  --   --  33.9  RDW 13.9 14.2 14.1  --   --  14.8*   ------------------------------------------------------------------------------------------------------------------  Chemistries   Recent Labs Lab 03/03/17 2156 03/04/17 0342 03/05/17 0353 03/09/17 0552  NA 130* 133* 136 136  K 3.5 3.3* 3.7 3.7  CL 97* 102 104 103  CO2 25 26 26 26   GLUCOSE 144* 113* 171* 99  BUN 8 7 10  14  CREATININE 0.60 0.56 0.59 0.61  CALCIUM 8.6* 8.1* 8.0* 8.8*  MG  --  1.6* 2.3  --    ------------------------------------------------------------------------------------------------------------------ estimated  creatinine clearance is 57.5 mL/min (by C-G formula based on SCr of 0.61 mg/dL). ------------------------------------------------------------------------------------------------------------------ No results for input(s): TSH, T4TOTAL, T3FREE, THYROIDAB in the last 72 hours.  Invalid input(s): FREET3  Cardiac Enzymes  Recent Labs Lab 03/07/17 1008 03/07/17 1408 03/07/17 1720  TROPONINI <0.03 <0.03 <0.03   ------------------------------------------------------------------------------------------------------------------ Invalid input(s): POCBNP ---------------------------------------------------------------------------------------------------------------  RADIOLOGY: Dg Thoracic Spine 2 View  Result Date: 03/10/2017 CLINICAL DATA:  Diffuse back pain beginning 2 days ago. EXAM: THORACIC SPINE 2 VIEWS COMPARISON:  Chest radiography 03/03/2017. Chest radiography 12/17/2016. FINDINGS: Minimal cervicothoracic curvature convex to the right and lower thoracic curvature convex to the left. No evidence of thoracic spine fracture or posttraumatic medial rib lesion. Mild disc space narrowing at T6-7, similar to the study of December 2017. IMPRESSION: No acute finding. Mild spinal curvature. Chronic disc space narrowing T6-7. Electronically Signed   By: Nelson Chimes M.D.   On: 03/10/2017 10:10    EKG:  Orders placed or performed during the hospital encounter of 03/03/17  . ED EKG  . ED EKG  . EKG 12-Lead  . EKG 12-Lead  . EKG 12-Lead  . EKG 12-Lead  . EKG 12-Lead  . EKG 12-Lead  . EKG 12-Lead  . EKG 12-Lead  . EKG 12-Lead  . EKG 12-Lead    ASSESSMENT AND PLAN:  Active Problems:   Closed intertrochanteric fracture of hip, left, initial encounter ([facility])  #1. Closed intertrochanteric hip fracture, left, status post intramedullary rod placement, 03/04/2017 by Dr. Kyla Balzarine, continue pain medications, supportive therapy,  discharge to skilled nursing facility ASAP urine cultures are known, ,  unfortunately,  recultured urine again, as it was contaminated initially  #2. Hyponatremia, resolved with IV fluid administration, follow intermittently  #3. Hypokalemia, supplemented intravenously, magnesium level was found to be low, supplemented intravenously , normalized  #4. Acute posthemorrhagic anemia,  hemoglobin level has decreased to 8.1 from 11.0 on admission, but improved after discontinuation of IV fluids to 9.1, 10.2 yesterday, no bleeding noted, follow hemoglobin level as outpatient #5. Leukocytosis, resolved #6. Hyperglycemia, hemoglobin A1c 5.3, no diabetes #7. Agitation, due to underlying dementia, continue Risperdal when necessary, improved #8 chest pain, she was thought to be due to gastrointestinal problems, as patient also had epigastric abdominal pain, EKG was unremarkable, troponins were negative, improved on Protonix, now patient has midthoracic pain on palpation, I suspect it could be radicular radiating to the chest area, EMLA cream as needed, radiology studies did not show acute fracture, just chronic changes #9. Urinary tract infection, continue patient on meropenem , status post PICC line placement, 03/08/2017, the  patient took it out the same day, meropenem was initiated due to concerns of multiple antibiotic resistances, discharge to skilled nursing facility when  urine cultures are known,  recultured urine due to contamination   Management plans discussed with the patient, family and they are in agreement.   DRUG ALLERGIES:  Allergies  Allergen Reactions  . Cefdinir Other (See Comments)    Liquid form causes oral thrush  . Codeine Nausea And Vomiting    nausea  . Ibandronic Acid Nausea Only  . Latex Hives  . Nitrofurantoin     Other reaction(s): Vomiting  . Raloxifene Other (See Comments)    Per pt, "eye doctor said I cannot take" Per pt, "eye doctor said I cannot take" Per pt, "eye doctor said  I cannot take"    CODE STATUS:     Code Status Orders          Start     Ordered   03/04/17 0111  Full code  Continuous     03/04/17 0110    Code Status History    Date Active Date Inactive Code Status Order ID Comments User Context   02/03/2017  8:29 PM 02/04/2017  7:26 PM Full Code [id number]  Vaughan Basta, MD Inpatient   12/17/2016  5:13 AM 12/18/2016  3:43 PM Full Code [id number]  Harrie Foreman, MD Inpatient    Advance Directive Documentation     Most Recent Value  Type of Advance Directive  Living will, Healthcare Power of Attorney  Pre-existing out of facility DNR order (yellow form or pink MOST form)  -  "MOST" Form in Place?  -      TOTAL TIME TAKING CARE OF THIS PATIENT 40 minutes   Seyed Heffley M.D on 03/10/2017 at 2:57 PM  Between 7am to 6pm - Pager - [phone]  After 6pm go to [email or web address] - password [email or web address]  Office  [phone]  CC: Primary care physician; Valera Castle, MD

## 2017-03-11 LAB — CREATININE, SERUM: Creatinine, Ser: 0.59 mg/dL (ref 0.44–1.00)

## 2017-03-11 MED ORDER — ENOXAPARIN SODIUM 40 MG/0.4ML ~~LOC~~ SOLN: 40.0000 mg | SUBCUTANEOUS | Status: DC

## 2017-03-11 MED ORDER — MELOXICAM 7.5 MG PO TABS
7.5000 mg | ORAL_TABLET | Freq: Every day | ORAL | Status: DC
  Administered 2017-03-11: 7.5 mg via ORAL
  Filled 2017-03-11: qty 1

## 2017-03-11 MED ORDER — ACETAMINOPHEN 325 MG PO TABS: 650.0000 mg | ORAL_TABLET | Freq: Four times a day (QID) | ORAL | Status: DC | PRN

## 2017-03-11 MED ORDER — OXYCODONE HCL 5 MG PO TABS
5.0000 mg | ORAL_TABLET | ORAL | 0 refills | Status: DC | PRN
Start: 2017-03-11 — End: 2017-12-14

## 2017-03-11 MED ORDER — DOCUSATE SODIUM 100 MG PO CAPS: 100.0000 mg | ORAL_CAPSULE | Freq: Two times a day (BID) | ORAL | 0 refills | Status: DC

## 2017-03-11 MED ORDER — LEVOFLOXACIN 500 MG PO TABS: 500.0000 mg | ORAL_TABLET | Freq: Every day | ORAL | 0 refills | Status: DC

## 2017-03-11 MED ORDER — LEVOFLOXACIN 500 MG PO TABS
500.0000 mg | ORAL_TABLET | Freq: Every day | ORAL | Status: DC
  Administered 2017-03-11: 500 mg via ORAL
  Filled 2017-03-11: qty 1

## 2017-03-11 NOTE — Progress Notes (Signed)
Called EMS. Spoke to [employer] at [facility] to give report on patient. IV removed, belongings packed and patient ready for transport.  Discharge packet given to patient.

## 2017-03-11 NOTE — Discharge Instructions (Signed)
[facility] at [facility]:  Cardiac diet  DISCHARGE CONDITION:  Stable  ACTIVITY:  Activity as tolerated  OXYGEN:  Home Oxygen: No.   Oxygen Delivery: room air  DISCHARGE LOCATION:  nursing home    ADDITIONAL DISCHARGE INSTRUCTION: Remove staples on 04/ 12/2016    If you experience worsening of your admission symptoms, develop shortness of breath, life threatening emergency, suicidal or homicidal thoughts you must seek medical attention immediately by calling 911 or calling your MD immediately  if symptoms less severe.  You Must read complete instructions/literature along with all the possible adverse reactions/side effects for all the Medicines you take and that have been prescribed to you. Take any new Medicines after you have completely understood and accpet all the possible adverse reactions/side effects.   Please note  You were cared for by a hospitalist during your hospital stay. If you have any questions about your discharge medications or the care you received while you were in the hospital after you are discharged, you can call the unit and asked to speak with the hospitalist on call if the hospitalist that took care of you is not available. Once you are discharged, your primary care physician will handle any further medical issues. Please note that NO REFILLS for any discharge medications will be authorized once you are discharged, as it is imperative that you return to your primary care physician (or establish a relationship with a primary care physician if you do not have one) for your aftercare needs so that they can reassess your need for medications and monitor your lab values.

## 2017-03-11 NOTE — Discharge Summary (Addendum)
[place] [facility], 76 y.o., DOB 1941-09-01, MRN [id number]. 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 female with a known history of osteoarthritis, breast cancer, GERD, hyperlipidemia, hypertension, hypothyroidism, Louis body dementia  presents to the emergency department for evaluation ofLeft hip pain status post fall.  Patient was in a usual state of health until this evening when she is walking down the hallway with out her walker and she sustained a mechanical fall landing on her left side. Patient was admitted for a hip fracture. Subsequently seen by orthopedic surgery underwent repair. Postop patient has had multiple complaints including pain in multiple parts of the body. Including her chest back and legs. Cardiac evaluation revealed that there was no wall motion abnormality on the echocardiogram. Cardiac enzymes were negative. Patient also noticed to have fever urinalysis showed that she had a UTI. Patient was treated with meropenem. Now she is afebrile WBC count normal. She will be continued on 3 more days of Levaquin. Urine culture showed multiple organisms.    Levaquin 500 mg daily for 3 days  Lovenox 40 mg subcutaneous every 24 hours for 14 days  Staple  removal date 03/19/2017         Consults  orthopedic surgery  Significant Tests:  See full reports for all details     Dg Chest 1 View  Result Date: 03/03/2017 CLINICAL DATA:  Initial evaluation for acute trauma, fall. EXAM: CHEST 1 VIEW COMPARISON:  Prior radiograph from 12/17/2016. FINDINGS: Mild cardiomegaly, stable. Mediastinal silhouette within normal limits. Lungs normally inflated. Chronic coarsening of the interstitial markings. No focal infiltrates, pulmonary edema, or pleural effusion. No pneumothorax. Irregular biapical pleural thickening, stable. No acute osseous abnormality.  Osteopenia. IMPRESSION: 1. No active cardiopulmonary disease. 2. Stable cardiomegaly. Electronically Signed   By: Jeannine Boga M.D.   On: 03/03/2017 22:23   Dg Thoracic Spine 2 View  Result Date: 03/10/2017 CLINICAL DATA:  Diffuse back pain beginning 2 days ago. EXAM: THORACIC SPINE 2 VIEWS COMPARISON:  Chest radiography 03/03/2017. Chest radiography 12/17/2016. FINDINGS: Minimal cervicothoracic curvature convex to the right and lower thoracic curvature convex to the left. No evidence of thoracic spine fracture or posttraumatic medial rib lesion. Mild disc space narrowing at T6-7, similar to the study of December 2017. IMPRESSION: No acute finding. Mild spinal curvature. Chronic disc space narrowing T6-7. Electronically Signed   By: Nelson Chimes M.D.   On: 03/10/2017 10:10   Dg Hip Operative Unilat W Or W/o Pelvis Left  Result Date: 03/04/2017 CLINICAL DATA:  LEFT hip fracture EXAM: OPERATIVE LEFT HIP (WITH PELVIS IF PERFORMED) 3 VIEWS TECHNIQUE: Fluoroscopic spot image(s) were submitted for interpretation post-operatively. COMPARISON:  03/03/2017 FINDINGS: Intramedullary nail fixation of intertrochanteric fracture. Dynamic compression screw and distal locking screw noted. IMPRESSION: Internal fixation of intertrochanteric fracture. Electronically Signed   By: Helane Gunther.D.  On: 03/04/2017  17:39   Dg Hip Unilat W Or Wo Pelvis 2-3 Views Left  Result Date: 03/03/2017 CLINICAL DATA:  Find fall with hip pain EXAM: DG HIP (WITH OR WITHOUT PELVIS) 2-3V LEFT COMPARISON:  None. FINDINGS: There is a medially angulated, superiorly displaced fracture of the intertrochanteric left femur with approximately 4 cm of overriding. The femoral head remains situated within the acetabulum. The right hip is normal. No other pelvic fracture or diastasis is identified. IMPRESSION: Medially angulated and superiorly displaced left femoral intertrochanteric fracture. Electronically Signed   By: Ulyses Jarred M.D.   On: 03/03/2017 22:20       Today   Subjective:   Julia Alvarado  complains of pain in her back legs  Objective:   Blood pressure (!) 140/56, pulse 84, temperature 98.6 F (37 C), resp. rate 20, height 5\' 6"  (1.676 m), weight 130 lb (59 kg), SpO2 96 %.  .  Intake/Output Summary (Last 24 hours) at 03/11/17 1009 Last data filed at 03/11/17 0508  Gross per 24 hour  Intake              510 ml  Output                0 ml  Net              510 ml    Exam VITAL SIGNS: Blood pressure (!) 140/56, pulse 84, temperature 98.6 F (37 C), resp. rate 20, height 5\' 6"  (1.676 m), weight 130 lb (59 kg), SpO2 96 %.  GENERAL:  76 y.o.-year-old patient lying in the bed with no acute distress.  EYES: Pupils equal, round, reactive to light and accommodation. No scleral icterus. Extraocular muscles intact.  HEENT: Head atraumatic, normocephalic. Oropharynx and nasopharynx clear.  NECK:  Supple, no jugular venous distention. No thyroid enlargement, no tenderness.  LUNGS: Normal breath sounds bilaterally, no wheezing, rales,rhonchi or crepitation. No use of accessory muscles of respiration.  CARDIOVASCULAR: S1, S2 normal. No murmurs, rubs, or gallops.  ABDOMEN: Soft, nontender, nondistended. Bowel sounds present. No organomegaly or mass.  EXTREMITIES: No pedal edema, cyanosis, or clubbing.  NEUROLOGIC:  Cranial nerves II through XII are intact. Muscle strength 5/5 in all extremities. Sensation intact. Gait not checked.  PSYCHIATRIC: The patient is alert and oriented x 3.  SKIN: No obvious rash, lesion, or ulcer.   Data Review     CBC w Diff:  Lab Results  Component Value Date   WBC 5.8 03/09/2017   HGB 10.2 (L) 03/09/2017   HGB 13.0 01/01/2014   HCT 30.2 (L) 03/09/2017   HCT 37.5 01/01/2014   PLT 311 03/09/2017   PLT 238 01/01/2014   LYMPHOPCT 18 02/03/2017   LYMPHOPCT 6.8 01/01/2014   MONOPCT 11 02/03/2017   MONOPCT 3.8 01/01/2014   EOSPCT 1 02/03/2017   EOSPCT 0.4 01/01/2014   BASOPCT 1 02/03/2017   BASOPCT 0.2 01/01/2014   CMP:  Lab Results  Component Value Date   NA 136 03/09/2017   NA 137 01/01/2014   K 3.7 03/09/2017   K 3.6 01/01/2014   CL 103 03/09/2017   CL 106 01/01/2014   CO2 26 03/09/2017   CO2 25 01/01/2014   BUN 14 03/09/2017   BUN 11 01/01/2014   CREATININE 0.59 03/11/2017   CREATININE 0.63 01/01/2014   PROT 6.8 02/03/2017   ALBUMIN 3.9 02/03/2017   BILITOT 0.4 02/03/2017   ALKPHOS 86 02/03/2017   AST 19 02/03/2017   ALT 11 (L)  02/03/2017  .  Micro Results Recent Results (from the past 240 hour(s))  Surgical pcr screen     Status: None   Collection Time: 03/04/17  2:17 AM  Result Value Ref Range Status   MRSA, PCR NEGATIVE NEGATIVE Final   Staphylococcus aureus NEGATIVE NEGATIVE Final    Comment:        The Xpert SA Assay (FDA approved for NASAL specimens in patients over 28 years of age), is one component of a comprehensive surveillance program.  Test performance has been validated by [facility] for patients greater than or equal to 48 year old. It is not intended to diagnose infection nor to guide or monitor treatment.   Urine culture     Status: Abnormal   Collection Time: 03/07/17 10:09 PM  Result Value Ref Range Status   Specimen Description URINE, RANDOM  Final   Special Requests NONE  Final   Culture MULTIPLE SPECIES  PRESENT, SUGGEST RECOLLECTION (A)  Final   Report Status 03/09/2017 FINAL  Final  Urine culture     Status: None   Collection Time: 03/09/17  9:12 AM  Result Value Ref Range Status   Specimen Description URINE, CATHETERIZED  Final   Special Requests NONE  Final   Culture   Final    NO GROWTH Performed at [facility], [place] [address] [place], [place] [address]    Report Status 03/10/2017 FINAL  Final        Code Status Orders        Start     Ordered   03/04/17 0111  Full code  Continuous     03/04/17 0110    Code Status History    Date Active Date Inactive Code Status Order ID Comments User Context   02/03/2017  8:29 PM 02/04/2017  7:26 PM Full Code [id number]  Vaughan Basta, MD Inpatient   12/17/2016  5:13 AM 12/18/2016  3:43 PM Full Code [id number]  Harrie Foreman, MD Inpatient    Advance Directive Documentation     Most Recent Value  Type of Advance Directive  Living will, Healthcare Power of Attorney  Pre-existing out of facility DNR order (yellow form or pink MOST form)  -  "MOST" Form in Place?  -           Contact information for follow-up providers    MENZ,MICHAEL, MD Follow up in 6 week(s).   Specialty:  Orthopedic Surgery Contact information: [place] [address] [phone]            Contact information for after-discharge care    Destination    [facility] Follow up.   Specialty:  [place] information: [address] [place] [phone]                  Discharge Medications   Allergies as of 03/11/2017      Reactions   Cefdinir Other (See Comments)   Liquid form causes oral thrush   Codeine Nausea And Vomiting   nausea   Ibandronic Acid Nausea Only   Latex Hives   Nitrofurantoin    Other reaction(s): Vomiting   Raloxifene Other (See Comments)   Per pt, "eye doctor said I cannot take" Per  pt, "eye doctor said I cannot take" Per pt, "eye doctor said I cannot take"      Medication List    STOP taking these medications  divalproex 125 MG DR tablet Commonly known as:  DEPAKOTE     TAKE these medications   acetaminophen 325 MG tablet Commonly known as:  TYLENOL Take 2 tablets (650 mg total) by mouth every 6 (six) hours as needed for mild pain (or Fever >/= 101).   Cranberry 405 MG Caps Take 2 capsules by mouth daily.   docusate sodium 100 MG capsule Commonly known as:  COLACE Take 1 capsule (100 mg total) by mouth 2 (two) times daily.   donepezil 5 MG tablet Commonly known as:  ARICEPT Take 5 mg by mouth at bedtime.   dorzolamide-timolol 22.3-6.8 MG/ML ophthalmic solution Commonly known as:  COSOPT Place 1 drop into the right eye 2 (two) times daily.   enoxaparin 40 MG/0.4ML injection Commonly known as:  LOVENOX Inject 0.4 mLs (40 mg total) into the skin daily. Start taking on:  03/12/2017   Fish Oil 1000 MG Caps Take 1 capsule by mouth daily. Reported on 06/09/2016   fluticasone 50 MCG/ACT nasal spray Commonly known as:  FLONASE Place into both nostrils daily.   GLUCOSAMINE-CHONDROITIN DS 500-400 MG tablet Generic drug:  glucosamine-chondroitin Take 1 tablet by mouth 3 (three) times daily. Reported on 06/09/2016   hydroxypropyl methylcellulose / hypromellose 2.5 % ophthalmic solution Commonly known as:  ISOPTO TEARS / GONIOVISC Place 1 drop into both eyes as needed for dry eyes.   ibuprofen 400 MG tablet Commonly known as:  ADVIL,MOTRIN Take 400 mg by mouth 3 (three) times daily. Reported on 06/09/2016   latanoprost 0.005 % ophthalmic solution Commonly known as:  XALATAN Place 1 drop into both eyes at bedtime. Reported on 06/09/2016   levofloxacin 500 MG tablet Commonly known as:  LEVAQUIN Take 1 tablet (500 mg total) by mouth daily.   levothyroxine 112 MCG tablet Commonly known as:  SYNTHROID, LEVOTHROID Take 1 tablet (112 mcg total) by mouth  daily before breakfast. Reported on 06/09/2016   loratadine 10 MG tablet Commonly known as:  CLARITIN Take 10 mg by mouth daily.   Melatonin 3 MG Caps Take 1 capsule by mouth at bedtime. Reported on 06/09/2016   meloxicam 7.5 MG tablet Commonly known as:  MOBIC Take 7.5 mg by mouth daily.   memantine 5 MG tablet Commonly known as:  NAMENDA Take 5 mg by mouth 2 (two) times daily.   oxyCODONE 5 MG immediate release tablet Commonly known as:  Oxy IR/ROXICODONE Take 1 tablet (5 mg total) by mouth every 4 (four) hours as needed for moderate pain, severe pain or breakthrough pain.   PROBIOTIC PO Place 1 capsule vaginally at bedtime.   risperiDONE 0.25 MG tablet Commonly known as:  RISPERDAL Take 0.5 mg by mouth 2 (two) times daily.   sodium chloride 0.65 % Soln nasal spray Commonly known as:  OCEAN Place 1 spray into both nostrils 5 (five) times daily. Reported on 06/09/2016   traZODone 50 MG tablet Commonly known as:  DESYREL Take 100 mg by mouth at bedtime.   vitamin B-12 1000 MCG tablet Commonly known as:  CYANOCOBALAMIN Take 1,000 mcg by mouth daily.   vitamin C 500 MG tablet Commonly known as:  ASCORBIC ACID Take 1 tablet (500 mg total) by mouth 2 (two) times daily.          Total Time in preparing paper work, data evaluation and todays exam - 35 minutes  Dustin Flock M.D on 03/11/2017 at 10:09 AM  [facility]   Office  [phone]

## 2017-03-11 NOTE — Progress Notes (Signed)
Subjective: 7 Days Post-Op Procedure(s) (LRB): INTRAMEDULLARY (IM) NAIL FEMORAL (Left) Patient reports pain as mild.  Does not complain of any back pain this AM, mild headache. Patient is well, and has had no acute complaints or problems. No recent fevers, denies any abdominal pain. We will continue therapy today.  Plan is to go Rehab after hospital stay.  Objective: Vital signs in last 24 hours: Temp:  [98.2 F (36.8 C)-98.6 F (37 C)] 98.6 F (37 C) (03/23 2353) Pulse Rate:  [75-118] 84 (03/24 0749) Resp:  [18-20] 20 (03/24 0749) BP: (138-143)/(48-87) 140/56 (03/24 0749) SpO2:  [98 %] 98 % (03/23 2353) well approximated incision Heels are non tender and elevated off the bed using rolled towels Intake/Output from previous day: 03/23 0701 - 03/24 0700 In: 510 [P.O.:360; IV Piggyback:150] Out: -  Intake/Output this shift: No intake/output data recorded.   Recent Labs  03/09/17 0552  HGB 10.2*    Recent Labs  03/09/17 0552  WBC 5.8  RBC 3.20*  HCT 30.2*  PLT 311    Recent Labs  03/09/17 0552 03/11/17 0452  NA 136  --   K 3.7  --   CL 103  --   CO2 26  --   BUN 14  --   CREATININE 0.61 0.59  GLUCOSE 99  --   CALCIUM 8.8*  --    No results for input(s): LABPT, INR in the last 72 hours.  EXAM General - Patient is Alert and Confused Extremity - Neurologically intact Neurovascular intact Sensation intact distally Intact pulses distally Dorsiflexion/Plantar flexion intact No cellulitis present Compartment soft  Lumbar thoracic spine-Patient tolerated rolling over in bed well with no grimacing or signs of pain. Dressing - dressing C/D/I and no drainage Motor Function - intact, moving foot and toes well on exam.    Past Medical History:  Diagnosis Date  . Arthritis   . Breast cancer ([place])   . Breast cancer ([place])   . GERD (gastroesophageal reflux disease)   . Hyperlipemia   . Hypertension   . Hypothyroidism   . Lewy body dementia without  behavioral disturbance   . Sacral fracture ([facility]) 1/15    Assessment/Plan: 7 Days Post-Op Procedure(s) (LRB): INTRAMEDULLARY (IM) NAIL FEMORAL (Left) Active Problems:   Closed intertrochanteric fracture of hip, left, initial encounter ([place])   Acute post op blood loss anemia   Estimated body mass index is 20.98 kg/m as calculated from the following:   Height as of this encounter: 5\' 6"  (1.676 m).   Weight as of this encounter: 59 kg (130 lb). Advance diet Up with therapy  Pt has had a BM.  No labs available for this AM, yesterday Hg was at 10.2. Plan on discharge to SNF pending medical clearance after urine culture has been obtained. Follow up with [facility] in 6 weeks.  Rehab facility to remove staples and apply steri strips on 03/19/17. Continue with Lovenox 40 mg SQ daily X 14 days for DVT prophylaxis.  Orthopaedics will sign off at this time due to primary reason for hospitalization at this time is for urine culture results.   DVT Prophylaxis - Lovenox, TED hose and Sequential leg wraps Weight-Bearing as tolerated to left leg  J. Cameron Proud, PA-C [place] 03/11/2017, 8:21 AM

## 2017-03-11 NOTE — Clinical Social Work Note (Signed)
The patient will dc to [facility] today via non-emergent EMS. The CSW updated [facility] on her diet (regular) and urinary cultures. The family and facility are aware and in agreement with dc plan. CSW will con't to follow for any additional dc needs.  Julia Alvarado, MSW, [place]

## 2017-03-11 NOTE — Progress Notes (Signed)
qPhysical Therapy Treatment Patient Details Name: Julia Alvarado MRN: [id number] DOB: 07-Jan-1941 Today's Date: 03/11/2017    History of Present Illness 76 yo female with onset of L hip fracture in a fall at her ALF, resulting in IM nailing.  PMHx:  OA, breast CA, HTN, Lewy body dementia, sacral fracture    PT Comments    Participated in exercises as described below.  Rolling left and right for care due to inc urine with max a x 1.  To edge of bed with max a x 1.  Transferred to chair at bedside with max a x 2.  Poor standing today.  Unsafe to attempt gait.  Pt remains confused and limits therapy sessions.   Follow Up Recommendations  SNF     Equipment Recommendations  None recommended by PT    Recommendations for Other Services       Precautions / Restrictions Precautions Precautions: Fall Precaution Comments: WBAT L hip, contact iso Restrictions Weight Bearing Restrictions: Yes LLE Weight Bearing: Weight bearing as tolerated    Mobility  Bed Mobility Overal bed mobility: Needs Assistance Bed Mobility: Supine to Sit Rolling: Max assist   Supine to sit: Mod assist;Max assist Sit to supine: Max assist;Total assist      Transfers Overall transfer level: Needs assistance Equipment used: Rolling walker (2 wheeled) Transfers: Sit to/from Stand Sit to Stand: Max assist;+2 physical assistance Stand pivot transfers: Max assist;+2 physical assistance          Ambulation/Gait             General Gait Details: Poor standing today, unable to ambualte.   Stairs            Wheelchair Mobility    Modified Rankin (Stroke Patients Only)       Balance Overall balance assessment: Needs assistance Sitting-balance support: No upper extremity supported;Feet supported Sitting balance-Leahy Scale: Fair     Standing balance support: Bilateral upper extremity supported Standing balance-Leahy Scale: Poor                              Cognition  Arousal/Alertness: Awake/alert Behavior During Therapy: WFL for tasks assessed/performed Overall Cognitive Status: History of cognitive impairments - at baseline                                 General Comments: anxious throughout session, poor safety awareness, unable to consistently follow commands      Exercises Other Exercises Other Exercises: BLE AAROM for ankle pumps, heel slides, ab/ad, SLR in supine    General Comments        Pertinent Vitals/Pain Pain Assessment: Faces Faces Pain Scale: Hurts whole lot Pain Location: L hip, back Pain Descriptors / Indicators: Aching;Grimacing;Guarding Pain Intervention(s): Limited activity within patient's tolerance;Repositioned    Home Living                      Prior Function            PT Goals (current goals can now be found in the care plan section)      Frequency    7X/week      PT Plan      Co-evaluation             End of Session Equipment Utilized During Treatment: Gait belt Activity Tolerance: Patient limited by pain Patient left:  in chair;with chair alarm set;with call bell/phone within reach Nurse Communication: Mobility status Pain - Right/Left: Left Pain - part of body: Hip     Time: 5284-1324 PT Time Calculation (min) (ACUTE ONLY): 26 min  Charges:  $Gait Training: 8-22 mins $Therapeutic Exercise: 8-22 mins                    G Codes:       Chesley Noon, PTA 03/11/17, 10:15 AM

## 2017-03-11 NOTE — Progress Notes (Signed)
OT Cancellation Note  Patient Details Name: Julia Alvarado MRN: [id number] DOB: 1941-08-29   Cancelled Treatment:    Reason Eval/Treat Not Completed: Other (comment)  Pt unable to follow directions for AE and transfer trng in ADL's - cognition confuse -  pt fixated on one think - and verbally repeat it over and over. Pt is addressing transfers.  Rosalyn Gess OTR/L,CLT  03/11/2017, 10:23 AM

## 2017-04-02 NOTE — Progress Notes (Addendum)
04/03/2017 8:57 AM   [address] 1941-06-16 [id number]  Referring provider: Valera Castle, MD [address] [place], [place] [address]  Chief Complaint  Patient presents with  . Recurrent UTI    3 month follow up  . Vaginal Atrophy    HPI: 76 yo WF who presents today for a 3 month follow up for lower urinary tract symptoms, vaginal atrophy and incontinence with [name] who provides transportation for [employer].  [name] cannot give a history.    Background history Patient is a 57 -year-old Caucasian female who is referred to [place] by, Dr. Kym Groom, for recurrent urinary tract infections who presents with her transportation, [name] from [facility].  Patient is a poor historian.  Patient states that she has had two to three urinary tract infections over the last year.  Her symptoms with a urinary tract infection consist of burning in the vaginal area and hurting in the bottom of her stomach.  She denies gross hematuria.  She has not had any recent fevers, chills, nausea or vomiting.  She does not have a history of nephrolithiasis, GU surgery or GU trauma.  Reviewing her records,  she has had one documented positive urine culture for E. Coli in 2016 with a variable resistance pattern.  She is not sexually active.  She is post menopausal.   She has a history of breast cancer.  She admits to constipation and/or diarrhea.   She does not engage in good perineal hygiene.  She wears pull ups at all time.  She does not take tub baths.   She has incontinence.  She has had a contrast CT on 11/24/2016 which noted no acute finding to explain abdominal pain.  Extensive colonic diverticulosis.  I have independently reviewed the films.  She is drinking 5 glasses of water daily.  Pepsi once in awhile.  She has cranberry juice when she has UTI symptoms.  Her baseline urinary symptoms consist of frequency, dysuria, incontinence, intermittency and hesitancy.  Her CATH UA was malordous,  >30 WBC's and moderate bacteria.  Her PVR was 233 mL.   She is complaining of burning in her vaginal area, headache and hurting in her lower stomach today.  Recurrent urinary tract infections Patient has had 3 documented positive urine cultures since she was last seen by [place] in January 2018.  Cultures have been positive for Escherichia coli with increasing resistance patterns.  Risk factors for recurrent urinary tract infections are dementia, vaginal atrophy and incontinence.  It is unknown if she has had any UTI's, since she was last seen by [place].  She is taking Vitamin C and probiotics.    Vaginal atrophy Patient does not a candidate for vaginal estrogen therapy due to her history of bladder cancer.  She is taking probiotics.    Incontinence She is wearing pads.  Her PVR today is 121 mL.        PMH: Past Medical History:  Diagnosis Date  . Arthritis   . Breast cancer ([place])   . Breast cancer ([place])   . GERD (gastroesophageal reflux disease)   . Hyperlipemia   . Hypertension   . Hypothyroidism   . Lewy body dementia without behavioral disturbance   . Sacral fracture ([place]) 1/15    Surgical History: Past Surgical History:  Procedure Laterality Date  . ABDOMINAL HYSTERECTOMY    . CATARACT EXTRACTION W/ INTRAOCULAR LENS IMPLANT Right   . CATARACT EXTRACTION W/PHACO Right 06/15/2016  Procedure: CATARACT EXTRACTION PHACO AND INTRAOCULAR LENS PLACEMENT (IOC);  Surgeon: Leandrew Koyanagi, MD;  Location: [place];  Service: Ophthalmology;  Laterality: Right;  . FEMUR IM NAIL Left 03/04/2017   Procedure: INTRAMEDULLARY (IM) NAIL FEMORAL;  Surgeon: Hessie Knows, MD;  Location: [facility];  Service: Orthopedics;  Laterality: Left;  Long Affixus, needs C-Arm  . MASTECTOMY Right ~2005  . MASTECTOMY    . RETINAL DETACHMENT REPAIR W/ SCLERAL BUCKLE LE      Home Medications:  Allergies as of 04/03/2017      Reactions   Cefdinir Other (See Comments)   Liquid form causes oral thrush    Codeine Nausea And Vomiting   nausea   Ibandronic Acid Nausea Only   Latex Hives   Nitrofurantoin    Other reaction(s): Vomiting   Raloxifene Other (See Comments)   Per pt, "eye doctor said I cannot take" Per pt, "eye doctor said I cannot take" Per pt, "eye doctor said I cannot take"      Medication List       Accurate as of 04/03/17  8:57 AM. Always use your most recent med list.          acetaminophen 325 MG tablet Commonly known as:  TYLENOL Take 2 tablets (650 mg total) by mouth every 6 (six) hours as needed for mild pain (or Fever >/= 101).   Cranberry 405 MG Caps Take 2 capsules by mouth daily.   docusate sodium 100 MG capsule Commonly known as:  COLACE Take 1 capsule (100 mg total) by mouth 2 (two) times daily.   donepezil 5 MG tablet Commonly known as:  ARICEPT Take 5 mg by mouth at bedtime.   dorzolamide-timolol 22.3-6.8 MG/ML ophthalmic solution Commonly known as:  COSOPT Place 1 drop into the right eye 2 (two) times daily.   enoxaparin 40 MG/0.4ML injection Commonly known as:  LOVENOX Inject 0.4 mLs (40 mg total) into the skin daily.   Fish Oil 1000 MG Caps Take 1 capsule by mouth daily. Reported on 06/09/2016   FISH OIL PO Take by mouth.   fluticasone 50 MCG/ACT nasal spray Commonly known as:  FLONASE Place into both nostrils daily.   GLUCOSAMINE-CHONDROITIN DS 500-400 MG tablet Generic drug:  glucosamine-chondroitin Take 1 tablet by mouth 3 (three) times daily. Reported on 06/09/2016   hydroxypropyl methylcellulose / hypromellose 2.5 % ophthalmic solution Commonly known as:  ISOPTO TEARS / GONIOVISC Place 1 drop into both eyes as needed for dry eyes.   ibuprofen 400 MG tablet Commonly known as:  ADVIL,MOTRIN Take 400 mg by mouth 3 (three) times daily. Reported on 06/09/2016   latanoprost 0.005 % ophthalmic solution Commonly known as:  XALATAN Place 1 drop into both eyes at bedtime. Reported on 06/09/2016   levofloxacin 500 MG  tablet Commonly known as:  LEVAQUIN Take 1 tablet (500 mg total) by mouth daily.   levothyroxine 112 MCG tablet Commonly known as:  SYNTHROID, LEVOTHROID Take 1 tablet (112 mcg total) by mouth daily before breakfast. Reported on 06/09/2016   loratadine 10 MG tablet Commonly known as:  CLARITIN Take 10 mg by mouth daily.   Melatonin 3 MG Caps Take 1 capsule by mouth at bedtime. Reported on 06/09/2016   meloxicam 7.5 MG tablet Commonly known as:  MOBIC Take 7.5 mg by mouth daily.   memantine 5 MG tablet Commonly known as:  NAMENDA Take 5 mg by mouth 2 (two) times daily.   oxyCODONE 5 MG immediate release tablet Commonly known  as:  Oxy IR/ROXICODONE Take 1 tablet (5 mg total) by mouth every 4 (four) hours as needed for moderate pain, severe pain or breakthrough pain.   PROBIOTIC PO Place 1 capsule vaginally at bedtime.   risperiDONE 0.25 MG tablet Commonly known as:  RISPERDAL Take 0.5 mg by mouth 2 (two) times daily.   sodium chloride 0.65 % Soln nasal spray Commonly known as:  OCEAN Place 1 spray into both nostrils 5 (five) times daily. Reported on 06/09/2016   traZODone 50 MG tablet Commonly known as:  DESYREL Take 100 mg by mouth at bedtime.   vitamin B-12 1000 MCG tablet Commonly known as:  CYANOCOBALAMIN Take 1,000 mcg by mouth daily.   vitamin C 500 MG tablet Commonly known as:  ASCORBIC ACID Take 1 tablet (500 mg total) by mouth 2 (two) times daily.       Allergies:  Allergies  Allergen Reactions  . Cefdinir Other (See Comments)    Liquid form causes oral thrush  . Codeine Nausea And Vomiting    nausea  . Ibandronic Acid Nausea Only  . Latex Hives  . Nitrofurantoin     Other reaction(s): Vomiting  . Raloxifene Other (See Comments)    Per pt, "eye doctor said I cannot take" Per pt, "eye doctor said I cannot take" Per pt, "eye doctor said I cannot take"    Family History: Family History  Problem Relation Age of Onset  . Heart disease Father    . Cancer Brother     all except 1 died of cancer    Social History:  reports that she has never smoked. She has never used smokeless tobacco. She reports that she does not drink alcohol or use drugs.  ROS: Unable to obtain due to dementia.  Physical Exam: BP 117/70   Pulse 86   Ht 5\' 4"  (1.626 m)   Constitutional: Well nourished. Alert and oriented, No acute distress. HEENT: [place] AT, moist mucus membranes. Trachea midline, no masses. Cardiovascular: No clubbing, cyanosis, or edema. Respiratory: Normal respiratory effort, no increased work of breathing. GI: Abdomen is soft, non tender, non distended, no abdominal masses. Liver and spleen not palpable.  No hernias appreciated.  Stool sample for occult testing is not indicated.   GU: No CVA tenderness.  No bladder fullness or masses.   Skin: No rashes, bruises or suspicious lesions. Lymph: No cervical or inguinal adenopathy. Neurologic: Grossly intact, no focal deficits, moving all 4 extremities. Psychiatric: Normal mood and affect.  Laboratory Data: Lab Results  Component Value Date   WBC 5.8 03/09/2017   HGB 10.2 (L) 03/09/2017   HCT 30.2 (L) 03/09/2017   MCV 94.2 03/09/2017   PLT 311 03/09/2017    Lab Results  Component Value Date   CREATININE 0.59 03/11/2017     Lab Results  Component Value Date   HGBA1C 5.3 03/04/2017    Lab Results  Component Value Date   TSH 47.074 (H) 12/16/2016     Lab Results  Component Value Date   AST 19 02/03/2017   Lab Results  Component Value Date   ALT 11 (L) 02/03/2017     Pertinent Imaging:   Assessment & Plan:    1. Recurrent UTI's  - unknown if patient has had any UTI's since she was last seen with [place]  2. Vaginal atrophy  - Patient is not a candidate for vaginal estrogen cream due to her history of breast cancer    - patient to continue probiotics   -  likely the cause of her "burning"- advised the use of olive oil or coconut oil for discomfort  3.  Incontinence  - managed with pull ups                          Return in about 1 year (around 04/03/2018) for PVR and OAB questionnaire.  These notes generated with voice recognition software. I apologize for typographical errors.  Zara Council, [facility] [address] [place], [place] [address] [phone]

## 2017-04-03 ENCOUNTER — Encounter: Payer: Self-pay | Admitting: Urology

## 2017-04-03 ENCOUNTER — Ambulatory Visit (INDEPENDENT_AMBULATORY_CARE_PROVIDER_SITE_OTHER): Payer: Medicare Other | Admitting: Urology

## 2017-04-03 VITALS — BP 117/70 | HR 86 | Ht 64.0 in

## 2017-04-03 DIAGNOSIS — N390 Urinary tract infection, site not specified: Secondary | ICD-10-CM | POA: Diagnosis not present

## 2017-04-03 DIAGNOSIS — I639 Cerebral infarction, unspecified: Secondary | ICD-10-CM | POA: Diagnosis not present

## 2017-04-03 DIAGNOSIS — R32 Unspecified urinary incontinence: Secondary | ICD-10-CM | POA: Diagnosis not present

## 2017-04-03 DIAGNOSIS — N952 Postmenopausal atrophic vaginitis: Secondary | ICD-10-CM | POA: Diagnosis not present

## 2017-04-03 LAB — BLADDER SCAN AMB NON-IMAGING: SCAN RESULT: 121

## 2017-04-03 NOTE — Progress Notes (Signed)
Patient unable to complete ROS and OAB questionaire due to [facility].

## 2017-04-09 ENCOUNTER — Emergency Department: Payer: Medicare Other

## 2017-04-09 ENCOUNTER — Emergency Department
Admission: EM | Admit: 2017-04-09 | Discharge: 2017-04-09 | Disposition: A | Discharge status: Home / Self Care | Payer: Medicare Other | Attending: Emergency Medicine | Admitting: Emergency Medicine

## 2017-04-09 ENCOUNTER — Encounter: Payer: Self-pay | Admitting: Emergency Medicine

## 2017-04-09 DIAGNOSIS — M25571 Pain in right ankle and joints of right foot: Secondary | ICD-10-CM | POA: Diagnosis present

## 2017-04-09 DIAGNOSIS — M8580 Other specified disorders of bone density and structure, unspecified site: Secondary | ICD-10-CM | POA: Insufficient documentation

## 2017-04-09 DIAGNOSIS — Z853 Personal history of malignant neoplasm of breast: Secondary | ICD-10-CM | POA: Diagnosis not present

## 2017-04-09 DIAGNOSIS — S72142D Displaced intertrochanteric fracture of left femur, subsequent encounter for closed fracture with routine healing: Secondary | ICD-10-CM | POA: Insufficient documentation

## 2017-04-09 DIAGNOSIS — Z79899 Other long term (current) drug therapy: Secondary | ICD-10-CM | POA: Diagnosis not present

## 2017-04-09 DIAGNOSIS — I10 Essential (primary) hypertension: Secondary | ICD-10-CM | POA: Diagnosis not present

## 2017-04-09 DIAGNOSIS — R52 Pain, unspecified: Secondary | ICD-10-CM

## 2017-04-09 DIAGNOSIS — E039 Hypothyroidism, unspecified: Secondary | ICD-10-CM | POA: Diagnosis not present

## 2017-04-09 DIAGNOSIS — X501XXD Overexertion from prolonged static or awkward postures, subsequent encounter: Secondary | ICD-10-CM | POA: Diagnosis not present

## 2017-04-09 DIAGNOSIS — Z8781 Personal history of (healed) traumatic fracture: Secondary | ICD-10-CM

## 2017-04-09 NOTE — ED Triage Notes (Signed)
Per ems  She twisted her leg while in shower   No fall    Pt comes from [facility].  No shortening or rotation noted

## 2017-04-09 NOTE — ED Provider Notes (Signed)
[facility] Emergency Department Provider Note  ____________________________________________  Time seen: Approximately 9:38 AM  I have reviewed the triage vital signs and the nursing notes.   HISTORY  Chief Complaint Ankle Pain and Sore Throat    HPI Julia Alvarado is a 76 y.o. female  that presents to the emergency department after twisting left hip while getting out of the shower with nursing aide at [facility] facility. Patient uses a walker and was able to walk into shower. After twisting her hip she was not able to walk out of the shower. She has been complaining of left leg pain since incident. Facility states that patient did not fall. She has had a fracture in her left hip previously and was sent to the ED to be sure that she did not reinjure her left hip.   Past Medical History:  Diagnosis Date  . Arthritis   . Breast cancer ([place])   . Breast cancer ([place])   . GERD (gastroesophageal reflux disease)   . Hyperlipemia   . Hypertension   . Hypothyroidism   . Lewy body dementia without behavioral disturbance   . Sacral fracture ([place]) 1/15    Patient Active Problem List   Diagnosis Date Noted  . Closed intertrochanteric fracture of hip, left, initial encounter ([place]) 03/03/2017  . UTI (urinary tract infection) 02/03/2017  . 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 GERD (gastroesophageal reflux disease)   . Hypothyroidism   . Sacral fracture ([place])   . Lewy body dementia without behavioral disturbance   . Breast cancer [facility])     Past Surgical History:  Procedure Laterality Date  . ABDOMINAL HYSTERECTOMY    . CATARACT EXTRACTION W/ INTRAOCULAR LENS IMPLANT Right   . CATARACT EXTRACTION W/PHACO Right 06/15/2016   Procedure: CATARACT EXTRACTION PHACO AND INTRAOCULAR LENS PLACEMENT (IOC);  Surgeon: Leandrew Koyanagi, MD;  Location: [place];  Service: Ophthalmology;  Laterality: Right;  . FEMUR IM NAIL Left 03/04/2017   Procedure: INTRAMEDULLARY  (IM) NAIL FEMORAL;  Surgeon: Hessie Knows, MD;  Location: [facility];  Service: Orthopedics;  Laterality: Left;  Long Affixus, needs C-Arm  . MASTECTOMY Right ~2005  . MASTECTOMY    . RETINAL DETACHMENT REPAIR W/ SCLERAL BUCKLE LE      Prior to Admission medications   Medication Sig Start Date End Date Taking? Authorizing Provider  acetaminophen (TYLENOL) 325 MG tablet Take 2 tablets (650 mg total) by mouth every 6 (six) hours as needed for mild pain (or Fever >/= 101). 03/11/17   Dustin Flock, MD  Cranberry 405 MG CAPS Take 2 capsules by mouth daily.    Historical Provider, MD  docusate sodium (COLACE) 100 MG capsule Take 1 capsule (100 mg total) by mouth 2 (two) times daily. Patient not taking: Reported on 04/03/2017 03/11/17   Dustin Flock, MD  donepezil (ARICEPT) 5 MG tablet Take 5 mg by mouth at bedtime.    Historical Provider, MD  dorzolamide-timolol (COSOPT) 22.3-6.8 MG/ML ophthalmic solution Place 1 drop into the right eye 2 (two) times daily.    Historical Provider, MD  enoxaparin (LOVENOX) 40 MG/0.4ML injection Inject 0.4 mLs (40 mg total) into the skin daily. 03/12/17 03/26/17  Dustin Flock, MD  fluticasone (FLONASE) 50 MCG/ACT nasal spray Place into both nostrils daily.    Historical Provider, MD  glucosamine-chondroitin (GLUCOSAMINE-CHONDROITIN DS) 500-400 MG tablet Take 1 tablet by mouth 3 (three) times daily. Reported on 06/09/2016    Historical Provider, MD  hydroxypropyl methylcellulose / hypromellose (ISOPTO TEARS /  GONIOVISC) 2.5 % ophthalmic solution Place 1 drop into both eyes as needed for dry eyes.     Historical Provider, MD  ibuprofen (ADVIL,MOTRIN) 400 MG tablet Take 400 mg by mouth 3 (three) times daily. Reported on 06/09/2016    Historical Provider, MD  latanoprost (XALATAN) 0.005 % ophthalmic solution Place 1 drop into both eyes at bedtime. Reported on 06/09/2016    Historical Provider, MD  levofloxacin (LEVAQUIN) 500 MG tablet Take 1 tablet (500 mg total) by mouth  daily. Patient not taking: Reported on 04/03/2017 03/11/17   Dustin Flock, MD  levothyroxine (SYNTHROID, LEVOTHROID) 112 MCG tablet Take 1 tablet (112 mcg total) by mouth daily before breakfast. Reported on 06/09/2016 12/18/16   Lytle Butte, MD  loratadine (CLARITIN) 10 MG tablet Take 10 mg by mouth daily.    Historical Provider, MD  Melatonin 3 MG CAPS Take 1 capsule by mouth at bedtime. Reported on 06/09/2016    Historical Provider, MD  meloxicam (MOBIC) 7.5 MG tablet Take 7.5 mg by mouth daily.    Historical Provider, MD  memantine (NAMENDA) 5 MG tablet Take 5 mg by mouth 2 (two) times daily.    Historical Provider, MD  Omega-3 Fatty Acids (FISH OIL PO) Take by mouth. 10/12/16 09/19/17  Historical Provider, MD  Omega-3 Fatty Acids (FISH OIL) 1000 MG CAPS Take 1 capsule by mouth daily. Reported on 06/09/2016    Historical Provider, MD  oxyCODONE (OXY IR/ROXICODONE) 5 MG immediate release tablet Take 1 tablet (5 mg total) by mouth every 4 (four) hours as needed for moderate pain, severe pain or breakthrough pain. 03/11/17   Dustin Flock, MD  Probiotic Product (PROBIOTIC PO) Place 1 capsule vaginally at bedtime.    Historical Provider, MD  risperiDONE (RISPERDAL) 0.25 MG tablet Take 0.5 mg by mouth 2 (two) times daily.     Historical Provider, MD  sodium chloride (OCEAN) 0.65 % SOLN nasal spray Place 1 spray into both nostrils 5 (five) times daily. Reported on 06/09/2016    Historical Provider, MD  traZODone (DESYREL) 50 MG tablet Take 100 mg by mouth at bedtime.     Historical Provider, MD  vitamin B-12 (CYANOCOBALAMIN) 1000 MCG tablet Take 1,000 mcg by mouth daily.    Historical Provider, MD  vitamin C (ASCORBIC ACID) 500 MG tablet Take 1 tablet (500 mg total) by mouth 2 (two) times daily. 02/04/17   Bettey Costa, MD    Allergies Cefdinir; Codeine; Ibandronic acid; Latex; Nitrofurantoin; and Raloxifene  Family History  Problem Relation Age of Onset  . Heart disease Father   . Cancer Brother      all except 1 died of cancer    Social History Social History  Substance Use Topics  . Smoking status: Never Smoker  . Smokeless tobacco: Never Used  . Alcohol use No     Review of Systems  ENT: Positive for sore throat. Cardiovascular: No chest pain. Respiratory: No SOB. Musculoskeletal: Positive for leg pain   ____________________________________________   PHYSICAL EXAM:  VITAL SIGNS: ED Triage Vitals  Enc Vitals Group     BP 04/09/17 0933 128/69     Pulse Rate 04/09/17 0933 66     Resp 04/09/17 0933 15     Temp 04/09/17 0933 97 F (36.1 C)     Temp Source 04/09/17 0933 Oral     SpO2 04/09/17 0933 97 %     Weight 04/09/17 0932 130 lb (59 kg)     Height 04/09/17 0932 5\' 4"  (  1.626 m)     Head Circumference --      Peak Flow --      Pain Score 04/09/17 0927 4     Pain Loc --      Pain Edu? --      Excl. in [place]? --      Constitutional: Confused. Eyes: Conjunctivae are normal. PERRL. EOMI. Head: Atraumatic. ENT:      Ears:      Nose: No congestion/rhinnorhea.      Mouth/Throat: Mucous membranes are moist. Oropharynx non-erythematous. Tonsils not enlarged. Uvula midline. Neck: No stridor.  Cardiovascular: Normal rate, regular rhythm.  Good peripheral circulation. Respiratory: Normal respiratory effort without tachypnea or retractions. Lungs CTAB. Good air entry to the bases with no decreased or absent breath sounds. Gastrointestinal: Bowel sounds 4 quadrants. Soft and nontender to palpation.  Musculoskeletal: Full range of motion to all extremities. No gross deformities appreciated. Skin:  Skin is warm, dry and intact. No rash noted.   ____________________________________________   LABS (all labs ordered are listed, but only abnormal results are displayed)  Labs Reviewed - No data to display ____________________________________________  EKG   ____________________________________________  RADIOLOGY Robinette Haines, personally viewed and evaluated  these images (plain radiographs) as part of my medical decision making, as well as reviewing the written report by the radiologist. Dg Pelvis 1-2 Views  Result Date: 04/09/2017 CLINICAL DATA:  Left hip pain.  Initial encounter peer EXAM: PELVIS - 1-2 VIEW COMPARISON:  03/03/2017 FINDINGS: Intramedullary nail and dynamic hip screw fixation are noted of an intertrochanteric left femur fracture, more fully evaluated on separate femur radiographs. The femoral heads are approximated with the acetabula on this single projection. Old left superior and inferior pubic rami fractures are again seen. No acute fracture is identified. The bones are osteopenic. IMPRESSION: No acute osseous abnormality identified. Old left pubic rami and left femur fractures. Electronically Signed   By: Logan Bores M.D.   On: 04/09/2017 10:49   Dg Knee 2 Views Left  Result Date: 04/09/2017 CLINICAL DATA:  Diffuse left leg pain.  Initial encounter. EXAM: LEFT KNEE - 1-2 VIEW COMPARISON:  None. FINDINGS: The distal aspect of an intramedullary nail is noted in the femur with a distal interlocking screw. No surrounding lucency is seen to suggest loosening or infection. There is no evidence of acute fracture, dislocation, or knee joint effusion. Minimal medial compartment joint space narrowing is present. The bones are osteopenic. No soft tissue abnormality is seen. IMPRESSION: No acute osseous abnormality identified. Electronically Signed   By: Logan Bores M.D.   On: 04/09/2017 10:47   Dg Ankle Complete Left  Result Date: 04/09/2017 CLINICAL DATA:  Left ankle pain due to a twisting injury today. Initial encounter. EXAM: LEFT ANKLE COMPLETE - 3+ VIEW COMPARISON:  None. FINDINGS: There is no evidence of fracture, dislocation, or joint effusion. There is no evidence of arthropathy or other focal bone abnormality. Soft tissues are unremarkable. IMPRESSION: Negative exam. Electronically Signed   By: Inge Rise M.D.   On: 04/09/2017 10:44    Dg Femur Min 2 Views Left  Result Date: 04/09/2017 CLINICAL DATA:  Diffuse left leg pain.  Initial encounter. EXAM: LEFT FEMUR 2 VIEWS COMPARISON:  Intraoperative fluoroscopic 03/04/2017. Left hip radiographs 03/03/2017. FINDINGS: Antegrade intramedullary nail and dynamic hip screw fixation of an intertrochanteric left femur fracture are again identified. A distal interlocking screw is also noted. Mild residual displacement of the fracture is grossly similar to the intraoperative fluoroscopic images.  The left hip is located. No definite acute fracture is identified. No lucency is seen about the instrumentation to suggest loosening or infection. Old left superior and inferior pubic rami fractures are again noted. The bones are osteopenic. IMPRESSION: Prior ORIF of intertrochanteric left femur fracture without acute osseous abnormality identified. Electronically Signed   By: Logan Bores M.D.   On: 04/09/2017 10:46    ____________________________________________    PROCEDURES  Procedure(s) performed:    Procedures    Medications - No data to display   ____________________________________________   INITIAL IMPRESSION / ASSESSMENT AND PLAN / ED COURSE  Pertinent labs & imaging results that were available during my care of the patient were reviewed by me and considered in my medical decision making (see chart for details).  Review of the [place] CSRS was performed in accordance of the [place] prior to dispensing any controlled drugs.   Patient's diagnosis is consistent with history of fracture and osteopenia. Vital signs and exam are reassuring. Left femur, left humerus, left knee, left ankle x-rays negative for acute bony abnormalities. Patient was unable to provide information about where her leg hurt so several x-rays were ordered. [facility]. Facility was concerned for left hip injury. Patient did not fall. Patient is a poor historian and was  unable to give any details about history or current symptoms. While here patient was complaining of sore throat. Sore throat was improved with drinking water. Facility states that this is patient's baseline. I do not think there is indication for further workup at this time. Patient is given ED precautions to return to the ED for any worsening or new symptoms.  ____________________________________________  FINAL CLINICAL IMPRESSION(S) / ED DIAGNOSES  Final diagnoses:  History of fracture  Osteopenia, unspecified location      NEW MEDICATIONS STARTED DURING THIS VISIT:  Discharge Medication List as of 04/09/2017 11:33 AM          This chart was dictated using voice recognition software/Dragon. Despite best efforts to proofread, errors can occur which can change the meaning. Any change was purely unintentional.    Laban Emperor, PA-C 04/09/17 [place], MD 04/11/17 [phone]

## 2017-07-11 NOTE — Progress Notes (Signed)
   02/04/17 1414  SLP G-Codes **NOT FOR INPATIENT CLASS**  Functional Assessment Tool Used clinical judgment  Swallow Current Status (O4695) CI  Swallow Goal Status (Q7225) CI  SLP Evaluations  $ SLP Speech Visit 1 Procedure  SLP Evaluations  $BSS Swallow 1 Procedure  Late entry GI codes added after review of initial evaluation

## 2017-12-14 ENCOUNTER — Emergency Department: Payer: Medicare Other

## 2017-12-14 ENCOUNTER — Encounter: Payer: Self-pay | Admitting: Emergency Medicine

## 2017-12-14 ENCOUNTER — Other Ambulatory Visit: Payer: Self-pay

## 2017-12-14 ENCOUNTER — Inpatient Hospital Stay
Admission: EM | Admit: 2017-12-14 | Discharge: 2017-12-18 | DRG: 853 | Disposition: A | Discharge status: Hospice | Payer: Medicare Other | Attending: Internal Medicine | Admitting: Internal Medicine

## 2017-12-14 DIAGNOSIS — N390 Urinary tract infection, site not specified: Secondary | ICD-10-CM

## 2017-12-14 DIAGNOSIS — Z961 Presence of intraocular lens: Secondary | ICD-10-CM | POA: Diagnosis present

## 2017-12-14 DIAGNOSIS — G3183 Dementia with Lewy bodies: Secondary | ICD-10-CM | POA: Diagnosis present

## 2017-12-14 DIAGNOSIS — L8990 Pressure ulcer of unspecified site, unspecified stage: Secondary | ICD-10-CM

## 2017-12-14 DIAGNOSIS — L03312 Cellulitis of back [any part except buttock]: Secondary | ICD-10-CM | POA: Diagnosis present

## 2017-12-14 DIAGNOSIS — L89620 Pressure ulcer of left heel, unstageable: Secondary | ICD-10-CM | POA: Diagnosis present

## 2017-12-14 DIAGNOSIS — L89150 Pressure ulcer of sacral region, unstageable: Secondary | ICD-10-CM | POA: Diagnosis present

## 2017-12-14 DIAGNOSIS — A419 Sepsis, unspecified organism: Secondary | ICD-10-CM | POA: Diagnosis present

## 2017-12-14 DIAGNOSIS — K219 Gastro-esophageal reflux disease without esophagitis: Secondary | ICD-10-CM | POA: Diagnosis present

## 2017-12-14 DIAGNOSIS — S31000A Unspecified open wound of lower back and pelvis without penetration into retroperitoneum, initial encounter: Secondary | ICD-10-CM

## 2017-12-14 DIAGNOSIS — Z9841 Cataract extraction status, right eye: Secondary | ICD-10-CM

## 2017-12-14 DIAGNOSIS — Z66 Do not resuscitate: Secondary | ICD-10-CM | POA: Diagnosis present

## 2017-12-14 DIAGNOSIS — L89159 Pressure ulcer of sacral region, unspecified stage: Secondary | ICD-10-CM | POA: Diagnosis not present

## 2017-12-14 DIAGNOSIS — L89214 Pressure ulcer of right hip, stage 4: Secondary | ICD-10-CM | POA: Diagnosis present

## 2017-12-14 DIAGNOSIS — Z853 Personal history of malignant neoplasm of breast: Secondary | ICD-10-CM

## 2017-12-14 DIAGNOSIS — L03116 Cellulitis of left lower limb: Secondary | ICD-10-CM | POA: Diagnosis present

## 2017-12-14 DIAGNOSIS — I10 Essential (primary) hypertension: Secondary | ICD-10-CM | POA: Diagnosis present

## 2017-12-14 DIAGNOSIS — F0280 Dementia in other diseases classified elsewhere without behavioral disturbance: Secondary | ICD-10-CM | POA: Diagnosis not present

## 2017-12-14 DIAGNOSIS — I96 Gangrene, not elsewhere classified: Secondary | ICD-10-CM | POA: Diagnosis present

## 2017-12-14 DIAGNOSIS — Z515 Encounter for palliative care: Secondary | ICD-10-CM | POA: Diagnosis present

## 2017-12-14 DIAGNOSIS — Z9011 Acquired absence of right breast and nipple: Secondary | ICD-10-CM | POA: Diagnosis not present

## 2017-12-14 DIAGNOSIS — A408 Other streptococcal sepsis: Secondary | ICD-10-CM | POA: Diagnosis not present

## 2017-12-14 DIAGNOSIS — L89224 Pressure ulcer of left hip, stage 4: Secondary | ICD-10-CM | POA: Diagnosis present

## 2017-12-14 DIAGNOSIS — E785 Hyperlipidemia, unspecified: Secondary | ICD-10-CM | POA: Diagnosis present

## 2017-12-14 DIAGNOSIS — Z7189 Other specified counseling: Secondary | ICD-10-CM | POA: Diagnosis not present

## 2017-12-14 DIAGNOSIS — Z90710 Acquired absence of both cervix and uterus: Secondary | ICD-10-CM

## 2017-12-14 DIAGNOSIS — G309 Alzheimer's disease, unspecified: Secondary | ICD-10-CM | POA: Diagnosis present

## 2017-12-14 DIAGNOSIS — Z79899 Other long term (current) drug therapy: Secondary | ICD-10-CM

## 2017-12-14 DIAGNOSIS — E039 Hypothyroidism, unspecified: Secondary | ICD-10-CM | POA: Diagnosis present

## 2017-12-14 DIAGNOSIS — D649 Anemia, unspecified: Secondary | ICD-10-CM | POA: Diagnosis present

## 2017-12-14 DIAGNOSIS — D72829 Elevated white blood cell count, unspecified: Secondary | ICD-10-CM

## 2017-12-14 LAB — CBC WITH DIFFERENTIAL/PLATELET
Basophils Absolute: 0.1 10*3/uL (ref 0–0.1)
Basophils Relative: 0 %
Eosinophils Absolute: 0.3 10*3/uL (ref 0–0.7)
Eosinophils Relative: 1 %
HEMATOCRIT: 29.2 % — AB (ref 35.0–47.0)
HEMOGLOBIN: 9.5 g/dL — AB (ref 12.0–16.0)
LYMPHS ABS: 1.7 10*3/uL (ref 1.0–3.6)
Lymphocytes Relative: 7 %
MCH: 33.1 pg (ref 26.0–34.0)
MCHC: 32.7 g/dL (ref 32.0–36.0)
MCV: 101.2 fL — ABNORMAL HIGH (ref 80.0–100.0)
MONOS PCT: 6 %
Monocytes Absolute: 1.5 10*3/uL — ABNORMAL HIGH (ref 0.2–0.9)
NEUTROS ABS: 21 10*3/uL — AB (ref 1.4–6.5)
NEUTROS PCT: 86 %
Platelets: 463 10*3/uL — ABNORMAL HIGH (ref 150–440)
RBC: 2.88 MIL/uL — ABNORMAL LOW (ref 3.80–5.20)
RDW: 15.7 % — ABNORMAL HIGH (ref 11.5–14.5)
WBC: 24.6 10*3/uL — ABNORMAL HIGH (ref 3.6–11.0)

## 2017-12-14 LAB — BASIC METABOLIC PANEL
Anion gap: 7 (ref 5–15)
BUN: 29 mg/dL — ABNORMAL HIGH (ref 6–20)
CHLORIDE: 103 mmol/L (ref 101–111)
CO2: 27 mmol/L (ref 22–32)
CREATININE: 0.72 mg/dL (ref 0.44–1.00)
Calcium: 8.1 mg/dL — ABNORMAL LOW (ref 8.9–10.3)
GFR calc non Af Amer: >60 mL/min (ref >60)
Glucose, Bld: 106 mg/dL — ABNORMAL HIGH (ref 65–99)
POTASSIUM: 4.3 mmol/L (ref 3.5–5.1)
SODIUM: 137 mmol/L (ref 135–145)

## 2017-12-14 LAB — LACTIC ACID, PLASMA
LACTIC ACID, VENOUS: 1.7 mmol/L (ref 0.5–1.9)
LACTIC ACID, VENOUS: 1.6 mmol/L (ref 0.5–1.9)
LACTIC ACID, VENOUS: 1.9 mmol/L (ref 0.5–1.9)

## 2017-12-14 LAB — TROPONIN I: TROPONIN I: 0.04 ng/mL — AB (ref <0.03)

## 2017-12-14 LAB — MRSA PCR SCREENING: MRSA by PCR: NEGATIVE

## 2017-12-14 LAB — PROTIME-INR
INR: 0.97
Prothrombin Time: 12.8 seconds (ref 11.4–15.2)

## 2017-12-14 LAB — APTT: APTT: 27 s (ref 24–36)

## 2017-12-14 LAB — PROCALCITONIN: Procalcitonin: 0.83 ng/mL

## 2017-12-14 MED ORDER — TRAZODONE HCL 100 MG PO TABS
100.0000 mg | ORAL_TABLET | Freq: Every day | ORAL | Status: DC
  Administered 2017-12-15 – 2017-12-17 (×4): 100 mg via ORAL
  Filled 2017-12-14 (×4): qty 1

## 2017-12-14 MED ORDER — LATANOPROST 0.005 % OP SOLN
1.0000 [drp] | Freq: Every day | OPHTHALMIC | Status: DC
  Administered 2017-12-15 – 2017-12-17 (×4): 1 [drp] via OPHTHALMIC
  Filled 2017-12-14: qty 2.5

## 2017-12-14 MED ORDER — SODIUM CHLORIDE 0.9 % IV BOLUS (SEPSIS)
250.0000 mL | Freq: Once | INTRAVENOUS | Status: AC
  Administered 2017-12-14: 250 mL via INTRAVENOUS

## 2017-12-14 MED ORDER — VANCOMYCIN HCL IN DEXTROSE 1-5 GM/200ML-% IV SOLN
1000.0000 mg | Freq: Once | INTRAVENOUS | Status: AC
  Administered 2017-12-14: 1000 mg via INTRAVENOUS
  Filled 2017-12-14: qty 200

## 2017-12-14 MED ORDER — VALPROATE SODIUM 250 MG/5ML PO SOLN: 750.0000 mg | Freq: Every day | ORAL | Status: DC

## 2017-12-14 MED ORDER — LEVOTHYROXINE SODIUM 50 MCG PO TABS
75.0000 ug | ORAL_TABLET | Freq: Every day | ORAL | Status: DC
  Administered 2017-12-15 – 2017-12-18 (×4): 75 ug via ORAL
  Filled 2017-12-14 (×4): qty 2

## 2017-12-14 MED ORDER — SALINE SPRAY 0.65 % NA SOLN
1.0000 | Freq: Every day | NASAL | Status: DC
  Administered 2017-12-15 – 2017-12-18 (×13): 1 via NASAL
  Filled 2017-12-14: qty 44

## 2017-12-14 MED ORDER — ONDANSETRON HCL 4 MG/2ML IJ SOLN: 4.0000 mg | Freq: Four times a day (QID) | INTRAMUSCULAR | Status: DC | PRN

## 2017-12-14 MED ORDER — SERTRALINE HCL 50 MG PO TABS
25.0000 mg | ORAL_TABLET | Freq: Two times a day (BID) | ORAL | Status: DC
  Administered 2017-12-15 – 2017-12-18 (×8): 25 mg via ORAL
  Filled 2017-12-14 (×8): qty 1

## 2017-12-14 MED ORDER — LACTATED RINGERS IV SOLN: INTRAVENOUS | Status: DC

## 2017-12-14 MED ORDER — PIPERACILLIN-TAZOBACTAM 3.375 G IVPB
3.3750 g | Freq: Three times a day (TID) | INTRAVENOUS | Status: DC
  Administered 2017-12-14 – 2017-12-15 (×2): 3.375 g via INTRAVENOUS
  Filled 2017-12-14 (×2): qty 50

## 2017-12-14 MED ORDER — VALPROATE SODIUM 250 MG/5ML PO SOLN
500.0000 mg | Freq: Two times a day (BID) | ORAL | Status: DC
  Administered 2017-12-15 – 2017-12-18 (×6): 500 mg via ORAL
  Filled 2017-12-14 (×8): qty 10

## 2017-12-14 MED ORDER — HYDROCODONE-ACETAMINOPHEN 5-325 MG PO TABS
1.0000 | ORAL_TABLET | ORAL | Status: DC | PRN
  Administered 2017-12-17: 2 via ORAL
  Administered 2017-12-17 (×3): 1 via ORAL
  Administered 2017-12-18: 2 via ORAL
  Filled 2017-12-14: qty 2
  Filled 2017-12-14 (×2): qty 1
  Filled 2017-12-14 (×2): qty 2
  Filled 2017-12-14: qty 1

## 2017-12-14 MED ORDER — VITAMIN B-12 1000 MCG PO TABS
1000.0000 ug | ORAL_TABLET | Freq: Every day | ORAL | Status: DC
  Administered 2017-12-15 – 2017-12-18 (×3): 1000 ug via ORAL
  Filled 2017-12-14 (×3): qty 1

## 2017-12-14 MED ORDER — SODIUM CHLORIDE 0.9 % IV BOLUS (SEPSIS)
500.0000 mL | Freq: Once | INTRAVENOUS | Status: AC
  Administered 2017-12-14: 500 mL via INTRAVENOUS

## 2017-12-14 MED ORDER — FLUTICASONE PROPIONATE 50 MCG/ACT NA SUSP
1.0000 | Freq: Every day | NASAL | Status: DC
  Administered 2017-12-15 – 2017-12-18 (×2): 1 via NASAL
  Filled 2017-12-14: qty 16

## 2017-12-14 MED ORDER — PNEUMOCOCCAL VAC POLYVALENT 25 MCG/0.5ML IJ INJ: 0.5000 mL | INJECTION | INTRAMUSCULAR | Status: DC

## 2017-12-14 MED ORDER — PIPERACILLIN-TAZOBACTAM 3.375 G IVPB 30 MIN
3.3750 g | Freq: Once | INTRAVENOUS | Status: AC
  Administered 2017-12-14: 3.375 g via INTRAVENOUS
  Filled 2017-12-14: qty 50

## 2017-12-14 MED ORDER — VALPROATE SODIUM 250 MG/5ML PO SOLN
250.0000 mg | Freq: Every day | ORAL | Status: DC
  Administered 2017-12-14 – 2017-12-17 (×4): 250 mg via ORAL
  Filled 2017-12-14 (×5): qty 5

## 2017-12-14 MED ORDER — VANCOMYCIN HCL IN DEXTROSE 1-5 GM/200ML-% IV SOLN: 1000.0000 mg | Freq: Once | INTRAVENOUS | Status: DC

## 2017-12-14 MED ORDER — HEPARIN SODIUM (PORCINE) 5000 UNIT/ML IJ SOLN
5000.0000 [IU] | Freq: Three times a day (TID) | INTRAMUSCULAR | Status: DC
  Administered 2017-12-15 – 2017-12-18 (×9): 5000 [IU] via SUBCUTANEOUS
  Filled 2017-12-14 (×11): qty 1

## 2017-12-14 MED ORDER — MELATONIN 5 MG PO TABS
0.5000 | ORAL_TABLET | Freq: Every day | ORAL | Status: DC
  Administered 2017-12-15 – 2017-12-18 (×4): 5 mg via ORAL
  Filled 2017-12-14 (×4): qty 1

## 2017-12-14 MED ORDER — SODIUM CHLORIDE 0.9 % IV BOLUS (SEPSIS)
1000.0000 mL | Freq: Once | INTRAVENOUS | Status: AC
  Administered 2017-12-14: 1000 mL via INTRAVENOUS

## 2017-12-14 MED ORDER — ACETAMINOPHEN 325 MG PO TABS: 650.0000 mg | ORAL_TABLET | Freq: Four times a day (QID) | ORAL | Status: DC | PRN

## 2017-12-14 MED ORDER — DEXTROSE IN LACTATED RINGERS 5 % IV SOLN
INTRAVENOUS | Status: DC
  Administered 2017-12-15 – 2017-12-17 (×6): via INTRAVENOUS

## 2017-12-14 MED ORDER — VANCOMYCIN HCL IN DEXTROSE 750-5 MG/150ML-% IV SOLN
750.0000 mg | INTRAVENOUS | Status: DC
  Filled 2017-12-14: qty 150

## 2017-12-14 MED ORDER — ONDANSETRON HCL 4 MG PO TABS: 4.0000 mg | ORAL_TABLET | Freq: Four times a day (QID) | ORAL | Status: DC | PRN

## 2017-12-14 MED ORDER — DOCUSATE SODIUM 100 MG PO CAPS
100.0000 mg | ORAL_CAPSULE | Freq: Two times a day (BID) | ORAL | Status: DC
  Administered 2017-12-15 – 2017-12-17 (×6): 100 mg via ORAL
  Filled 2017-12-14 (×7): qty 1

## 2017-12-14 MED ORDER — ACETAMINOPHEN 650 MG RE SUPP
650.0000 mg | Freq: Four times a day (QID) | RECTAL | Status: DC | PRN
  Administered 2017-12-15: 650 mg via RECTAL
  Filled 2017-12-14: qty 1

## 2017-12-14 MED ORDER — POLYETHYLENE GLYCOL 3350 17 G PO PACK: 17.0000 g | PACK | Freq: Every day | ORAL | Status: DC | PRN

## 2017-12-14 MED ORDER — MEMANTINE HCL 5 MG PO TABS
10.0000 mg | ORAL_TABLET | Freq: Two times a day (BID) | ORAL | Status: DC
  Administered 2017-12-15 – 2017-12-18 (×8): 10 mg via ORAL
  Filled 2017-12-14 (×8): qty 2

## 2017-12-14 MED ORDER — RISAQUAD PO CAPS
2.0000 | ORAL_CAPSULE | Freq: Every day | ORAL | Status: DC
  Administered 2017-12-14 – 2017-12-18 (×4): 2 via ORAL
  Filled 2017-12-14 (×4): qty 2

## 2017-12-14 MED ORDER — VITAMIN C 500 MG PO TABS
500.0000 mg | ORAL_TABLET | Freq: Two times a day (BID) | ORAL | Status: DC
  Administered 2017-12-15 – 2017-12-18 (×7): 500 mg via ORAL
  Filled 2017-12-14 (×8): qty 1

## 2017-12-14 MED ORDER — MELOXICAM 7.5 MG PO TABS
7.5000 mg | ORAL_TABLET | Freq: Every day | ORAL | Status: DC
  Administered 2017-12-15 – 2017-12-18 (×4): 7.5 mg via ORAL
  Filled 2017-12-14 (×4): qty 1

## 2017-12-14 NOTE — H&P (Signed)
[facility] [place] NAME: Julia Alvarado    MR#:  [id number]  DATE OF BIRTH:  1941/03/04  DATE OF ADMISSION:  12/14/2017  PRIMARY CARE PHYSICIAN: Valera Castle, MD   REQUESTING/REFERRING PHYSICIAN:   CHIEF COMPLAINT:   Chief Complaint  Patient presents with  . Fever    HISTORY OF PRESENT ILLNESS: Julia Alvarado  is a 76 y.o. female with a known history per below which also includes chronic bilateral hip wounds, chronic sacral wounds, chronic left heel pressure wound presenting from local nursing home via EMS for fevers, altered mental status, decreased responsiveness, patient recently completed course of Keflex for 7-10 days prior to Christmas for infected wound, in the emergency room patient was found to have a white count of 24,000, examination noted for purulent draining left hip wound, areas of skin necrosing resolving the right hip wound, and sacral wound in need of debridement, family at the bedside, patient is DNR, patient is now been admitted for acute probable sepsis secondary to infected left hip wound.  PAST MEDICAL HISTORY:   Past Medical History:  Diagnosis Date  . Arthritis   . Breast cancer ([place])   . Breast cancer ([place])   . GERD (gastroesophageal reflux disease)   . Hyperlipemia   . Hypertension   . Hypothyroidism   . Lewy body dementia without behavioral disturbance   . Sacral fracture ([place]) 1/15    PAST SURGICAL HISTORY:  Past Surgical History:  Procedure Laterality Date  . ABDOMINAL HYSTERECTOMY    . CATARACT EXTRACTION W/ INTRAOCULAR LENS IMPLANT Right   . CATARACT EXTRACTION W/PHACO Right 06/15/2016   Procedure: CATARACT EXTRACTION PHACO AND INTRAOCULAR LENS PLACEMENT (IOC);  Surgeon: Leandrew Koyanagi, MD;  Location: [facility];  Service: Ophthalmology;  Laterality: Right;  . FEMUR IM NAIL Left 03/04/2017   Procedure: INTRAMEDULLARY (IM) NAIL FEMORAL;  Surgeon: Hessie Knows, MD;  Location: [facility]  ORS;  Service: Orthopedics;  Laterality: Left;  Long Affixus, needs C-Arm  . MASTECTOMY Right ~2005  . MASTECTOMY    . RETINAL DETACHMENT REPAIR W/ SCLERAL BUCKLE LE      SOCIAL HISTORY:  Social History   Tobacco Use  . Smoking status: Never Smoker  . Smokeless tobacco: Never Used  Substance Use Topics  . Alcohol use: No    Alcohol/week: 0.0 oz    FAMILY HISTORY:  Family History  Problem Relation Age of Onset  . Heart disease Father   . Cancer Brother        all except 1 died of cancer    DRUG ALLERGIES:  Allergies  Allergen Reactions  . Cefdinir Other (See Comments)    Liquid form causes oral thrush  . Codeine Nausea And Vomiting    nausea  . Ibandronic Acid Nausea Only  . Latex Hives  . Nitrofurantoin     Other reaction(s): Vomiting  . Raloxifene Other (See Comments)    Per pt, "eye doctor said I cannot take" Per pt, "eye doctor said I cannot take" Per pt, "eye doctor said I cannot take"    REVIEW OF SYSTEMS:  Unable to be obtained given poor historian/dementia  MEDICATIONS AT HOME:  Prior to Admission medications   Medication Sig Start Date End Date Taking? Authorizing Provider  acetaminophen (TYLENOL) 325 MG tablet Take 2 tablets (650 mg total) by mouth every 6 (six) hours as needed for mild pain (or Fever >/= 101). 03/11/17  Yes Dustin Flock, MD  Cranberry 450 MG  CAPS Take 1 capsule by mouth daily.    Yes [provider]  docusate sodium (COLACE) 100 MG capsule Take 1 capsule (100 mg total) by mouth 2 (two) times daily. 03/11/17  Yes Dustin Flock, MD  fluticasone (FLONASE) 50 MCG/ACT nasal spray Place into both nostrils daily.   Yes [provider]  glucosamine-chondroitin (GLUCOSAMINE-CHONDROITIN DS) 500-400 MG tablet Take 1 tablet by mouth 3 (three) times daily. Reported on 06/09/2016   Yes [provider]  latanoprost (XALATAN) 0.005 % ophthalmic solution Place 1 drop into both eyes at bedtime. Reported on 06/09/2016   Yes  [provider]  levothyroxine (SYNTHROID, LEVOTHROID) 112 MCG tablet Take 1 tablet (112 mcg total) by mouth daily before breakfast. Reported on 06/09/2016 Patient taking differently: Take 75 mcg by mouth daily before breakfast.  12/18/16  Yes Hower, Aaron Mose, MD  Melatonin 3 MG CAPS Take 1 capsule by mouth at bedtime. Reported on 06/09/2016   Yes [provider]  meloxicam (MOBIC) 7.5 MG tablet Take 7.5 mg by mouth daily.   Yes [provider]  memantine (NAMENDA) 10 MG tablet Take 10 mg by mouth 2 (two) times daily. 5mg -PM   Yes [provider]  sertraline (ZOLOFT) 25 MG tablet Take 25 mg by mouth 2 (two) times daily.   Yes [provider]  sodium chloride (OCEAN) 0.65 % SOLN nasal spray Place 1 spray into both nostrils 5 (five) times daily. Reported on 06/09/2016   Yes [provider]  traZODone (DESYREL) 50 MG tablet Take 100 mg by mouth at bedtime.    Yes [provider]  Valproate Sodium (DEPAKENE) 250 MG/5ML SOLN solution Take 15 mLs by mouth daily. 72ml- Twice a day/ 42ml-AT bedtime 10/24/17  Yes [provider]  vitamin B-12 (CYANOCOBALAMIN) 1000 MCG tablet Take 1,000 mcg by mouth daily.   Yes [provider]  vitamin C (ASCORBIC ACID) 500 MG tablet Take 1 tablet (500 mg total) by mouth 2 (two) times daily. 02/04/17  Yes Mody, Ulice Bold, MD  enoxaparin (LOVENOX) 40 MG/0.4ML injection Inject 0.4 mLs (40 mg total) into the skin daily. 03/12/17 03/26/17  Dustin Flock, MD      PHYSICAL EXAMINATION:   VITAL SIGNS: Blood pressure (!) 127/58, pulse 86, temperature 98.3 F (36.8 C), temperature source Oral, resp. rate (!) 23, height 4' 11.02" (1.499 m), weight 51.7 kg (113 lb 15.7 oz), SpO2 99 %.  GENERAL:  76 y.o.-year-old patient lying in the bed with no acute distress.  Malnourished, frail appearing EYES: Pupils equal, round, reactive to light and accommodation. No scleral icterus. Extraocular muscles intact.  HEENT:  Head atraumatic, normocephalic. Oropharynx and nasopharynx clear.  NECK:  Supple, no jugular venous distention. No thyroid enlargement, no tenderness.  LUNGS: Normal breath sounds bilaterally, no wheezing, rales,rhonchi or crepitation. No use of accessory muscles of respiration.  CARDIOVASCULAR: S1, S2 normal. No murmurs, rubs, or gallops.  ABDOMEN: Soft, nontender, nondistended. Bowel sounds present. No organomegaly or mass.  EXTREMITIES: No pedal edema, cyanosis, or clubbing.  NEUROLOGIC: Cranial nerves II through XII are intact. MAES.  At neurological baseline, gait not checked.  PSYCHIATRIC: The patient is alert, awake, nonverbal, does not follow commands  SKIN: Unstageable large sacral wound, purulent draining left hip wound, necrotic stage III/IV right hip wound, unstageable left pressure wound  LABORATORY PANEL:   CBC Recent Labs  Lab 12/14/17 1423  WBC 24.6*  HGB 9.5*  HCT 29.2*  PLT 463*  MCV 101.2*  MCH 33.1  MCHC  32.7  RDW 15.7*  LYMPHSABS 1.7  MONOABS 1.5*  EOSABS 0.3  BASOSABS 0.1   ------------------------------------------------------------------------------------------------------------------  Chemistries  Recent Labs  Lab 12/14/17 1423  NA 137  K 4.3  CL 103  CO2 27  GLUCOSE 106*  BUN 29*  CREATININE 0.72  CALCIUM 8.1*   ------------------------------------------------------------------------------------------------------------------ estimated creatinine clearance is 41.4 mL/min (by C-G formula based on SCr of 0.72 mg/dL). ------------------------------------------------------------------------------------------------------------------ No results for input(s): TSH, T4TOTAL, T3FREE, THYROIDAB in the last 72 hours.  Invalid input(s): FREET3   Coagulation profile No results for input(s): INR, PROTIME in the last 168 hours. ------------------------------------------------------------------------------------------------------------------- No results  for input(s): DDIMER in the last 72 hours. -------------------------------------------------------------------------------------------------------------------  Cardiac Enzymes Recent Labs  Lab 12/14/17 1423  TROPONINI 0.04*   ------------------------------------------------------------------------------------------------------------------ Invalid input(s): POCBNP  ---------------------------------------------------------------------------------------------------------------  Urinalysis    Component Value Date/Time   COLORURINE YELLOW (A) 03/07/2017 2209   APPEARANCEUR CLOUDY (A) 03/07/2017 2209   APPEARANCEUR Cloudy (A) 01/19/2017 1616   LABSPEC 1.006 03/07/2017 2209   LABSPEC 1.016 01/01/2014 1148   PHURINE 9.0 (H) 03/07/2017 2209   GLUCOSEU NEGATIVE 03/07/2017 2209   GLUCOSEU Negative 01/01/2014 1148   HGBUR SMALL (A) 03/07/2017 2209   BILIRUBINUR NEGATIVE 03/07/2017 2209   BILIRUBINUR Negative 01/19/2017 1616   BILIRUBINUR Negative 01/01/2014 1148   KETONESUR 5 (A) 03/07/2017 2209   PROTEINUR NEGATIVE 03/07/2017 2209   NITRITE NEGATIVE 03/07/2017 2209   LEUKOCYTESUR LARGE (A) 03/07/2017 2209   LEUKOCYTESUR 3+ (A) 01/19/2017 1616   LEUKOCYTESUR Negative 01/01/2014 1148     RADIOLOGY: Dg Chest Port 1 View  Result Date: 12/14/2017 CLINICAL DATA:  Elevated white count EXAM: PORTABLE CHEST 1 VIEW COMPARISON:  03/03/2017 FINDINGS: Minimal atelectasis or scar at the left base. No pleural effusion. Borderline cardiomegaly. No pneumothorax. Pleural and parenchymal scarring at the apices. IMPRESSION: 1. Borderline to mild cardiomegaly without edema 2. Streaky atelectasis or scar at the left base. No definite acute pulmonary infiltrate. Electronically Signed   By: Donavan Foil M.D.   On: 12/14/2017 15:29    EKG: Orders placed or performed during the hospital encounter of 03/03/17  . ED EKG  . ED EKG  . EKG 12-Lead  . EKG 12-Lead  . EKG 12-Lead  . EKG 12-Lead  . EKG  12-Lead  . EKG 12-Lead  . EKG 12-Lead  . EKG 12-Lead  . EKG 12-Lead  . EKG 12-Lead  . EKG    IMPRESSION AND PLAN: 1 acute probable sepsis secondary to infected wounds Admit on sepsis protocol, empiric vancomycin/Levaquin given allergies to cephalosporins, follow-up on cultures, consult general surgery for I&D/debridement evaluation  2 acute on chronic infected left hip wound, unstageable large sacral wound with associated cellulitis, draining stage III/IV hip wound with surrounding skin necrosis Plan of care as stated above Wound care nurse consulted for all other pressure wound areas  3 chronic dementia At baseline increase nursing care as needed, continue home regiment aspiration/fall/skin care precautions  4 chronic benign essential hypertension Stable Continue home regiment  DNR status per family Condition stable DVT prophylaxis with heparin subcu Long-term prognosis dismal Disposition pending clinical course  All the records are reviewed and case discussed with ED provider. Management plans discussed with the patient, family and they are in agreement.  CODE STATUS: Code Status History    Date Active Date Inactive Code Status Order ID Comments User Context   03/04/2017 01:10 03/11/2017 16:20 Full Code [id number]  Harvie Bridge, DO Inpatient   02/03/2017 20:29 02/04/2017 19:26 Full Code [id number]  Vaughan Basta, MD Inpatient   12/17/2016 05:13 12/18/2016 15:43 Full Code [id number]  Harrie Foreman, MD Inpatient       TOTAL TIME TAKING CARE OF THIS PATIENT: 45 minutes.    Avel Peace Cordel Drewes M.D on 12/14/2017   Between 7am to 6pm - Pager - [phone]  After 6pm go to [email or web address] - password [email or web address] [facility]  Office  [phone]  CC: Primary care physician; Valera Castle, MD   Note: This dictation was prepared with Dragon dictation along with smaller phrase technology. Any transcriptional errors that result from  this process are unintentional.

## 2017-12-14 NOTE — ED Notes (Signed)
Pt moved from 5h to 7 - report received from [place]

## 2017-12-14 NOTE — ED Provider Notes (Signed)
[facility] Emergency Department Provider Note ____________________________________________   I have reviewed the triage vital signs and the triage nursing note.  HISTORY  Chief Complaint Fever   Historian Level 5 Caveat History Limited by patient with dementia, from a care home Son at the bedside provides some additional baseline history  HPI Julia Alvarado is a 76 y.o. female with dementia from a care home, sent in because it sounds like she had some decreased mental status this morning, not responding as well as normal, and she felt warm to the touch although not documented fever.  Reportedly has backside wounds for which she was on a course of Keflex for 7-10 days and ended just before Christmas.  No reported vomiting or diarrhea.  Son at the bedside states that her mental status here in the ER seems to be what it has been here over the past several weeks which is eyes open, not following commands, making a lot of repetitive sounds.   Past Medical History:  Diagnosis Date  . Arthritis   . Breast cancer ([place])   . Breast cancer ([place])   . GERD (gastroesophageal reflux disease)   . Hyperlipemia   . Hypertension   . Hypothyroidism   . Lewy body dementia without behavioral disturbance   . Sacral fracture ([place]) 1/15    Patient Active Problem List   Diagnosis Date Noted  . Closed intertrochanteric fracture of hip, left, initial encounter ([place]) 03/03/2017  . UTI (urinary tract infection) 02/03/2017  . 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 GERD (gastroesophageal reflux disease)   . Hypothyroidism   . Sacral fracture ([place])   . Lewy body dementia without behavioral disturbance   . Breast cancer [facility])     Past Surgical History:  Procedure Laterality Date  . ABDOMINAL HYSTERECTOMY    . CATARACT EXTRACTION W/ INTRAOCULAR LENS IMPLANT Right   . CATARACT EXTRACTION W/PHACO Right 06/15/2016   Procedure: CATARACT EXTRACTION PHACO AND INTRAOCULAR LENS PLACEMENT (IOC);   Surgeon: Leandrew Koyanagi, MD;  Location: [place];  Service: Ophthalmology;  Laterality: Right;  . FEMUR IM NAIL Left 03/04/2017   Procedure: INTRAMEDULLARY (IM) NAIL FEMORAL;  Surgeon: Hessie Knows, MD;  Location: [facility] ORS;  Service: Orthopedics;  Laterality: Left;  Long Affixus, needs C-Arm  . MASTECTOMY Right ~2005  . MASTECTOMY    . RETINAL DETACHMENT REPAIR W/ SCLERAL BUCKLE LE      Prior to Admission medications   Medication Sig Start Date End Date Taking? Authorizing Provider  acetaminophen (TYLENOL) 325 MG tablet Take 2 tablets (650 mg total) by mouth every 6 (six) hours as needed for mild pain (or Fever >/= 101). 03/11/17   Dustin Flock, MD  Cranberry 405 MG CAPS Take 2 capsules by mouth daily.    [provider]  docusate sodium (COLACE) 100 MG capsule Take 1 capsule (100 mg total) by mouth 2 (two) times daily. Patient not taking: Reported on 04/03/2017 03/11/17   Dustin Flock, MD  donepezil (ARICEPT) 5 MG tablet Take 5 mg by mouth at bedtime.    [provider]  dorzolamide-timolol (COSOPT) 22.3-6.8 MG/ML ophthalmic solution Place 1 drop into the right eye 2 (two) times daily.    [provider]  enoxaparin (LOVENOX) 40 MG/0.4ML injection Inject 0.4 mLs (40 mg total) into the skin daily. 03/12/17 03/26/17  Dustin Flock, MD  fluticasone (FLONASE) 50 MCG/ACT nasal spray Place into both nostrils daily.    [provider]  glucosamine-chondroitin (GLUCOSAMINE-CHONDROITIN DS) 500-400 MG tablet Take  1 tablet by mouth 3 (three) times daily. Reported on 06/09/2016    [provider]  hydroxypropyl methylcellulose / hypromellose (ISOPTO TEARS / GONIOVISC) 2.5 % ophthalmic solution Place 1 drop into both eyes as needed for dry eyes.     [provider]  ibuprofen (ADVIL,MOTRIN) 400 MG tablet Take 400 mg by mouth 3 (three) times daily. Reported on 06/09/2016    [provider]  latanoprost (XALATAN) 0.005 % ophthalmic  solution Place 1 drop into both eyes at bedtime. Reported on 06/09/2016    [provider]  levofloxacin (LEVAQUIN) 500 MG tablet Take 1 tablet (500 mg total) by mouth daily. Patient not taking: Reported on 04/03/2017 03/11/17   Dustin Flock, MD  levothyroxine (SYNTHROID, LEVOTHROID) 112 MCG tablet Take 1 tablet (112 mcg total) by mouth daily before breakfast. Reported on 06/09/2016 12/18/16   Hower, Aaron Mose, MD  loratadine (CLARITIN) 10 MG tablet Take 10 mg by mouth daily.    [provider]  Melatonin 3 MG CAPS Take 1 capsule by mouth at bedtime. Reported on 06/09/2016    [provider]  meloxicam (MOBIC) 7.5 MG tablet Take 7.5 mg by mouth daily.    [provider]  memantine (NAMENDA) 5 MG tablet Take 5 mg by mouth 2 (two) times daily.    [provider]  Omega-3 Fatty Acids (FISH OIL) 1000 MG CAPS Take 1 capsule by mouth daily. Reported on 06/09/2016    [provider]  oxyCODONE (OXY IR/ROXICODONE) 5 MG immediate release tablet Take 1 tablet (5 mg total) by mouth every 4 (four) hours as needed for moderate pain, severe pain or breakthrough pain. 03/11/17   Dustin Flock, MD  Probiotic Product (PROBIOTIC PO) Place 1 capsule vaginally at bedtime.    [provider]  risperiDONE (RISPERDAL) 0.25 MG tablet Take 0.5 mg by mouth 2 (two) times daily.     [provider]  sodium chloride (OCEAN) 0.65 % SOLN nasal spray Place 1 spray into both nostrils 5 (five) times daily. Reported on 06/09/2016    [provider]  traZODone (DESYREL) 50 MG tablet Take 100 mg by mouth at bedtime.     [provider]  vitamin B-12 (CYANOCOBALAMIN) 1000 MCG tablet Take 1,000 mcg by mouth daily.    [provider]  vitamin C (ASCORBIC ACID) 500 MG tablet Take 1 tablet (500 mg total) by mouth 2 (two) times daily. 02/04/17   Bettey Costa, MD    Allergies  Allergen Reactions  . Cefdinir Other (See Comments)    Liquid form  causes oral thrush  . Codeine Nausea And Vomiting    nausea  . Ibandronic Acid Nausea Only  . Latex Hives  . Nitrofurantoin     Other reaction(s): Vomiting  . Raloxifene Other (See Comments)    Per pt, "eye doctor said I cannot take" Per pt, "eye doctor said I cannot take" Per pt, "eye doctor said I cannot take"    Family History  Problem Relation Age of Onset  . Heart disease Father   . Cancer Brother        all except 1 died of cancer    Social History Social History   Tobacco Use  . Smoking status: Never Smoker  . Smokeless tobacco: Never Used  Substance Use Topics  . Alcohol use: No    Alcohol/week: 0.0 oz  . Drug use: No    Review of Systems  Constitutional: Negative for treatment are documented fever. Eyes:  Negative for red eyes. ENT: Negative for sore throat. Cardiovascular: Negative for chest pain. Respiratory: Negative for shortness of breath cough. Gastrointestinal: Negative for any reported vomiting and diarrhea. Genitourinary: Negative for dysuria. Musculoskeletal: Negative for back pain. Skin: Negative for rash. Neurological: Negative for headache.  ____________________________________________   PHYSICAL EXAM:  VITAL SIGNS: ED Triage Vitals  Enc Vitals Group     BP 12/14/17 1417 (!) 110/58     Pulse Rate 12/14/17 1417 98     Resp 12/14/17 1416 16     Temp 12/14/17 1416 98.3 F (36.8 C)     Temp Source 12/14/17 1416 Oral     SpO2 12/14/17 1416 97 %     Weight --      Height --      Head Circumference --      Peak Flow --      Pain Score --      Pain Loc --      Pain Edu? --      Excl. in [place]? --      Constitutional: Alert and to voice, really not following commands, but apparently at baseline. Well appearing and in no distress. HEENT   Head: Normocephalic and atraumatic.      Eyes: Conjunctivae are normal. Pupils equal and round.       Ears:         Nose: No congestion/rhinnorhea.   Mouth/Throat: Mucous membranes are mildly  dry.   Neck: No stridor. Cardiovascular/Chest: Normal rate, regular rhythm.  No murmurs, rubs, or gallops. Respiratory: Normal respiratory effort without tachypnea nor retractions. Breath sounds are clear and equal bilaterally. No wheezes/rales/rhonchi. Gastrointestinal: Soft. No distention, no guarding, no rebound. Nontender.    Genitourinary/rectal:Deferred, patient in the hallway, once she gets in room will be checked. Musculoskeletal: Nontender with normal range of motion in all extremities. No joint effusions.  No lower extremity tenderness.  No edema. Neurologic: Mumbling words but sounds like this is her baseline.  There is no facial droop.  She did answer no to whether or not she was having pain.  She is moving 4 extremities, no gross or focal neurologic deficits are appreciated. Skin:  Skin is warm, dry and intact. No rash noted. Psychiatric: No agitation.   ____________________________________________  LABS (pertinent positives/negatives) I, Lisa Roca, MD the attending physician have reviewed the labs noted below.  Labs Reviewed  CBC WITH DIFFERENTIAL/PLATELET - Abnormal; Notable for the following components:      Result Value   WBC 24.6 (*)    RBC 2.88 (*)    Hemoglobin 9.5 (*)    HCT 29.2 (*)    MCV 101.2 (*)    RDW 15.7 (*)    Platelets 463 (*)    Neutro Abs 21.0 (*)    Monocytes Absolute 1.5 (*)    All other components within normal limits  CULTURE, BLOOD (ROUTINE X 2)  CULTURE, BLOOD (ROUTINE X 2)  URINE CULTURE  LACTIC ACID, PLASMA  BASIC METABOLIC PANEL  TROPONIN I  LACTIC ACID, PLASMA  URINALYSIS, COMPLETE (UACMP) WITH MICROSCOPIC    ____________________________________________    EKG I, Lisa Roca, MD, the attending physician have personally viewed and interpreted all ECGs.  Pending ____________________________________________  RADIOLOGY All Xrays were viewed by me.  Imaging interpreted by Radiologist, and I, Lisa Roca, MD the  attending physician have reviewed the radiologist interpretation noted below.  Pending __________________________________________  PROCEDURES  Procedure(s) performed: None  Critical Care performed: None   ____________________________________________  ED  COURSE / ASSESSMENT AND PLAN  Pertinent labs & imaging results that were available during my care of the patient were reviewed by me and considered in my medical decision making (see chart for details).    Patient with dementia, sent in by nursing home for questionable altered mental status this morning, does not seem like a focal finding or trauma or neurologic, however she has history of apparently wound ulcers, and they felt like she was warm.  She does not have a fever here.  No hypotension or tachycardia.  Will initiate broad workup.  White blood cell count found to be elevated.  Patient care transferred to Dr. Quentin Cornwall at shift change 3 PM.  Patient needs to have her backside looked at, and likely admitted.  DIFFERENTIAL DIAGNOSIS: Including but not limited to acute renal failure, dehydration, sepsis, urinary tract infection, etc.    Patient / Family / Caregiver informed of clinical course, medical decision-making process, and agree with plan.    ___________________________________________   FINAL CLINICAL IMPRESSION(S) / ED DIAGNOSES   Final diagnoses:  Leukocytosis, unspecified type      ___________________________________________        Note: This dictation was prepared with Dragon dictation. Any transcriptional errors that result from this process are unintentional    Lisa Roca, MD 12/14/17 [phone]

## 2017-12-14 NOTE — ED Triage Notes (Signed)
Pt to ED via EMS from [facility] nursing facility with call out for fever. Pt taking antibiotics for possible wounds with increased drainage since 12/24. Pt hx of dementia, unable to answer questions approp. Per EMS HR 106, afebrile. Pt in NAD at this time

## 2017-12-14 NOTE — ED Notes (Signed)
Wound cx received from lt hip, rt hip and sacrum at dr's request and wound pads applied. Pt urinated as she was turned, no urine sample obtained.

## 2017-12-14 NOTE — Progress Notes (Addendum)
ANTIBIOTIC CONSULT NOTE - INITIAL  Pharmacy Consult for Zosyn and vancomycin Indication: cellulitis  Allergies  Allergen Reactions  . Cefdinir Other (See Comments)    Liquid form causes oral thrush  . Codeine Nausea And Vomiting    nausea  . Ibandronic Acid Nausea Only  . Latex Hives  . Nitrofurantoin     Other reaction(s): Vomiting  . Raloxifene Other (See Comments)    Per pt, "eye doctor said I cannot take" Per pt, "eye doctor said I cannot take" Per pt, "eye doctor said I cannot take"    Patient Measurements: Height: 4' 11.02" (149.9 cm) Weight: 113 lb 15.7 oz (51.7 kg) IBW/kg (Calculated) : 43.24 Adjusted Body Weight:   Vital Signs: Temp: 98.3 F (36.8 C) (12/27 1416) Temp Source: Oral (12/27 1416) BP: 110/58 (12/27 1417) Pulse Rate: 98 (12/27 1417) Intake/Output from previous day: No intake/output data recorded. Intake/Output from this shift: No intake/output data recorded.  Labs: Recent Labs    12/14/17 1423  WBC 24.6*  HGB 9.5*  PLT 463*  CREATININE 0.72   Estimated Creatinine Clearance: 41.4 mL/min (by C-G formula based on SCr of 0.72 mg/dL). No results for input(s): VANCOTROUGH, VANCOPEAK, VANCORANDOM, GENTTROUGH, GENTPEAK, GENTRANDOM, TOBRATROUGH, TOBRAPEAK, TOBRARND, AMIKACINPEAK, AMIKACINTROU, AMIKACIN in the last 72 hours.   Microbiology: No results found for this or any previous visit (from the past 720 hour(s)).  Medical History: Past Medical History:  Diagnosis Date  . Arthritis   . Breast cancer ([place])   . Breast cancer ([place])   . GERD (gastroesophageal reflux disease)   . Hyperlipemia   . Hypertension   . Hypothyroidism   . Lewy body dementia without behavioral disturbance   . Sacral fracture ([place]) 1/15    Medications:  Infusions:  . piperacillin-tazobactam    . piperacillin-tazobactam (ZOSYN)  IV    . vancomycin    . [START ON 12/15/2017] vancomycin     Assessment: 33 yof with infected wound on back, failing OP oral abx.  Pharmacy consulted to dose vancomycin and Zosyn for cellulitis.  Goal of Therapy:  Vancomycin trough level 10-15 mcg/ml  Plan:  1. Zosyn 3.375 gm IV Q8H EI >> meropenem 12/28 2. Vancomycin 1000 mg IV x 1 in ED followed in approximately 23 hours (stacked dosing) by vancomycin 750 mg IV Q24H, predicted trough 15 mcg/mL. Pharmacy will continue to follow and adjust as needed to maintain trough 10 to 15 mcg/mL.   Vd 29.9 L, Ke 0.038 hr-1, T1/2 18 hr  Laural Benes, Pharm.D., BCPS Clinical Pharmacist 12/14/2017,4:10 PM

## 2017-12-14 NOTE — ED Provider Notes (Signed)
Patient received in sign-out from Dr. Reita Cliche.    She with significant leukocytosis, dementia and does appear ill.  On examination of her back and sacral area she has a nontypeable decubitus ulcer with large necrotic eschar is surrounding erythema but no purulent drainage.  Remainder blood work still pending.  We will give gentle IV hydration but I am concerned the patient is septic.   Clinical Course as of Dec 14 1550  Thu Dec 14, 2017  1550 Based on the extent of patient's wound with leukocytosis fever at home and marks concerning for sepsis from this wound and cellulitis refill patient will require admission for IV antibiotics that she is already failing oral antibiotics at the nursing facility as well as for wound evaluation is she is likely to require some component of debridement.  Have discussed with the patient and available family all diagnostics and treatments performed thus far and all questions were answered to the best of my ability. The patient demonstrates understanding and agreement with plan.   [PR]    Clinical Course User Index [PR] Merlyn Lot, MD         Merlyn Lot, MD 12/14/17 [phone]

## 2017-12-15 DIAGNOSIS — S31000A Unspecified open wound of lower back and pelvis without penetration into retroperitoneum, initial encounter: Secondary | ICD-10-CM

## 2017-12-15 LAB — BLOOD CULTURE ID PANEL (REFLEXED)
Acinetobacter baumannii: NOT DETECTED
CANDIDA PARAPSILOSIS: NOT DETECTED
CANDIDA TROPICALIS: NOT DETECTED
CARBAPENEM RESISTANCE: NOT DETECTED
Candida albicans: NOT DETECTED
Candida glabrata: NOT DETECTED
Candida krusei: NOT DETECTED
ENTEROCOCCUS SPECIES: DETECTED — AB
Enterobacter cloacae complex: NOT DETECTED
Enterobacteriaceae species: DETECTED — AB
Escherichia coli: DETECTED — AB
HAEMOPHILUS INFLUENZAE: NOT DETECTED
KLEBSIELLA PNEUMONIAE: NOT DETECTED
Klebsiella oxytoca: NOT DETECTED
LISTERIA MONOCYTOGENES: NOT DETECTED
Neisseria meningitidis: NOT DETECTED
PROTEUS SPECIES: DETECTED — AB
Pseudomonas aeruginosa: NOT DETECTED
SERRATIA MARCESCENS: NOT DETECTED
STAPHYLOCOCCUS AUREUS BCID: NOT DETECTED
STAPHYLOCOCCUS SPECIES: NOT DETECTED
STREPTOCOCCUS PYOGENES: NOT DETECTED
Streptococcus agalactiae: DETECTED — AB
Streptococcus pneumoniae: NOT DETECTED
Streptococcus species: DETECTED — AB
VANCOMYCIN RESISTANCE: NOT DETECTED

## 2017-12-15 LAB — CBC
HCT: 24 % — ABNORMAL LOW (ref 35.0–47.0)
Hemoglobin: 7.7 g/dL — ABNORMAL LOW (ref 12.0–16.0)
MCH: 32.4 pg (ref 26.0–34.0)
MCHC: 32.1 g/dL (ref 32.0–36.0)
MCV: 101.1 fL — AB (ref 80.0–100.0)
PLATELETS: 367 10*3/uL (ref 150–440)
RBC: 2.38 MIL/uL — ABNORMAL LOW (ref 3.80–5.20)
RDW: 15.7 % — AB (ref 11.5–14.5)
WBC: 21.9 10*3/uL — ABNORMAL HIGH (ref 3.6–11.0)

## 2017-12-15 LAB — BASIC METABOLIC PANEL
Anion gap: 4 — ABNORMAL LOW (ref 5–15)
BUN: 18 mg/dL (ref 6–20)
CALCIUM: 7.4 mg/dL — AB (ref 8.9–10.3)
CHLORIDE: 109 mmol/L (ref 101–111)
CO2: 24 mmol/L (ref 22–32)
CREATININE: 0.5 mg/dL (ref 0.44–1.00)
GFR calc Af Amer: >60 mL/min (ref >60)
GFR calc non Af Amer: >60 mL/min (ref >60)
Glucose, Bld: 137 mg/dL — ABNORMAL HIGH (ref 65–99)
Potassium: 3.7 mmol/L (ref 3.5–5.1)
SODIUM: 137 mmol/L (ref 135–145)

## 2017-12-15 MED ORDER — SODIUM CHLORIDE 0.9 % IV SOLN
1.0000 g | Freq: Three times a day (TID) | INTRAVENOUS | Status: DC
  Administered 2017-12-15 – 2017-12-18 (×8): 1 g via INTRAVENOUS
  Filled 2017-12-15 (×11): qty 1

## 2017-12-15 MED ORDER — OCUVITE-LUTEIN PO CAPS
1.0000 | ORAL_CAPSULE | Freq: Every day | ORAL | Status: DC
  Administered 2017-12-15 – 2017-12-18 (×3): 1 via ORAL
  Filled 2017-12-15 (×4): qty 1

## 2017-12-15 MED ORDER — VANCOMYCIN HCL IN DEXTROSE 750-5 MG/150ML-% IV SOLN
750.0000 mg | INTRAVENOUS | Status: DC
  Administered 2017-12-15 – 2017-12-17 (×3): 750 mg via INTRAVENOUS
  Filled 2017-12-15 (×5): qty 150

## 2017-12-15 MED ORDER — SODIUM CHLORIDE 0.9 % IV SOLN
1.0000 g | Freq: Two times a day (BID) | INTRAVENOUS | Status: DC
  Administered 2017-12-15: 1 g via INTRAVENOUS
  Filled 2017-12-15 (×2): qty 1

## 2017-12-15 NOTE — Consult Note (Signed)
Surgical Consultation  12/15/2017  Julia Alvarado is an 76 y.o. female.   Referring Physician: Vianne Bulls  CC: Sacral decub  HPI: This is a nursing home patient with severe dementia contractures and decubitus ulcers.  I was asked see the patient for decubitus ulcers.  Patient is unable to give a history or review of systems due to her dementia.  Her family members assist with history and state that this is been going on for several months. They also state that she had a very difficult time with anesthesia with her hip surgery. Past Medical History:  Diagnosis Date  . Arthritis   . Breast cancer ([place])   . Breast cancer ([place])   . GERD (gastroesophageal reflux disease)   . Hyperlipemia   . Hypertension   . Hypothyroidism   . Lewy body dementia without behavioral disturbance   . Sacral fracture ([facility] [place]) 1/15    Past Surgical History:  Procedure Laterality Date  . ABDOMINAL HYSTERECTOMY    . CATARACT EXTRACTION W/ INTRAOCULAR LENS IMPLANT Right   . CATARACT EXTRACTION W/PHACO Right 06/15/2016   Procedure: CATARACT EXTRACTION PHACO AND INTRAOCULAR LENS PLACEMENT (IOC);  Surgeon: Leandrew Koyanagi, MD;  Location: [facility];  Service: Ophthalmology;  Laterality: Right;  . FEMUR IM NAIL Left 03/04/2017   Procedure: INTRAMEDULLARY (IM) NAIL FEMORAL;  Surgeon: Hessie Knows, MD;  Location: [facility];  Service: Orthopedics;  Laterality: Left;  Long Affixus, needs C-Arm  . MASTECTOMY Right ~2005  . MASTECTOMY    . RETINAL DETACHMENT REPAIR W/ SCLERAL BUCKLE LE      Family History  Problem Relation Age of Onset  . Heart disease Father   . Cancer Brother        all except 1 died of cancer    Social History:  reports that  has never smoked. she has never used smokeless tobacco. She reports that she does not drink alcohol or use drugs.  Allergies:  Allergies  Allergen Reactions  . Cefdinir Other (See Comments)    Liquid form causes oral thrush  . Codeine Nausea And Vomiting     nausea  . Ibandronic Acid Nausea Only  . Latex Hives  . Nitrofurantoin     Other reaction(s): Vomiting  . Raloxifene Other (See Comments)    Per pt, "eye doctor said I cannot take" Per pt, "eye doctor said I cannot take" Per pt, "eye doctor said I cannot take"    Medications reviewed.   Review of Systems:   Review of Systems  Unable to perform ROS: Dementia     Physical Exam:  BP 127/86 (BP Location: Right Arm)   Pulse 96   Temp 99.2 F (37.3 C) (Axillary)   Resp (!) 24   Ht 4' 11.02" (1.499 m)   Wt 113 lb 15.7 oz (51.7 kg)   SpO2 98%   BMI 23.01 kg/m   Physical Exam  Constitutional: She is well-developed, well-nourished, and in no distress. No distress.  Patient incoherently repetitively tries to speak. Nursing assistant was attempting to feed the patient.  HENT:  Head: Normocephalic and atraumatic.  Eyes: Pupils are equal, round, and reactive to light. Right eye exhibits no discharge. Left eye exhibits no discharge. No scleral icterus.  Neck: Normal range of motion.  Pulmonary/Chest: Effort normal.  Abdominal: Soft. She exhibits no distension. There is no tenderness.  Musculoskeletal: Normal range of motion. She exhibits edema. She exhibits no tenderness.  Lymphadenopathy:    She has no cervical adenopathy.  Skin: Skin  is warm. She is not diaphoretic. There is erythema.  bilat decub Draining pus No foul odor  Vitals reviewed.     Results for orders placed or performed during the hospital encounter of 12/14/17 (from the past 48 hour(s))  Basic metabolic panel     Status: Abnormal   Collection Time: 12/14/17  2:23 PM  Result Value Ref Range   Sodium 137 135 - 145 mmol/L   Potassium 4.3 3.5 - 5.1 mmol/L   Chloride 103 101 - 111 mmol/L   CO2 27 22 - 32 mmol/L   Glucose, Bld 106 (H) 65 - 99 mg/dL   BUN 29 (H) 6 - 20 mg/dL   Creatinine, Ser 0.72 0.44 - 1.00 mg/dL   Calcium 8.1 (L) 8.9 - 10.3 mg/dL   GFR calc non Af Amer >60 >60 mL/min   GFR calc Af  Amer >60 >60 mL/min    Comment: (NOTE) The eGFR has been calculated using the CKD EPI equation. This calculation has not been validated in all clinical situations. eGFR's persistently <60 mL/min signify possible Chronic Kidney Disease.    Anion gap 7 5 - 15    Comment: Performed at [facility], [address] [place], [place] [address]  Troponin I     Status: Abnormal   Collection Time: 12/14/17  2:23 PM  Result Value Ref Range   Troponin I 0.04 (HH) <0.03 ng/mL    Comment: CRITICAL RESULT CALLED TO, READ BACK BY AND VERIFIED WITH SUSAN NEAL AT 3419 ON 12/14/2017 JJB Performed at [facility], [address] [place], [place] [address]   Lactic acid, plasma     Status: None   Collection Time: 12/14/17  2:23 PM  Result Value Ref Range   Lactic Acid, Venous 1.7 0.5 - 1.9 mmol/L    Comment: Performed at [facility], [address] [place], [place] [address]  CBC with Differential     Status: Abnormal   Collection Time: 12/14/17  2:23 PM  Result Value Ref Range   WBC 24.6 (H) 3.6 - 11.0 K/uL   RBC 2.88 (L) 3.80 - 5.20 MIL/uL   Hemoglobin 9.5 (L) 12.0 - 16.0 g/dL   HCT 29.2 (L) 35.0 - 47.0 %   MCV 101.2 (H) 80.0 - 100.0 fL   MCH 33.1 26.0 - 34.0 pg   MCHC 32.7 32.0 - 36.0 g/dL   RDW 15.7 (H) 11.5 - 14.5 %   Platelets 463 (H) 150 - 440 K/uL   Neutrophils Relative % 86 %   Neutro Abs 21.0 (H) 1.4 - 6.5 K/uL   Lymphocytes Relative 7 %   Lymphs Abs 1.7 1.0 - 3.6 K/uL   Monocytes Relative 6 %   Monocytes Absolute 1.5 (H) 0.2 - 0.9 K/uL   Eosinophils Relative 1 %   Eosinophils Absolute 0.3 0 - 0.7 K/uL   Basophils Relative 0 %   Basophils Absolute 0.1 0 - 0.1 K/uL    Comment: Performed at [facility], [address] [place], [place] [address]  Culture, blood (routine x 2)     Status: None (Preliminary result)   Collection Time: 12/14/17  2:27 PM  Result Value Ref Range   Specimen Description      BLOOD BLOOD RIGHT FOREARM Performed  at [facility], [address] [place], [place] [address]    Special Requests      BOTTLES DRAWN AEROBIC AND ANAEROBIC Blood Culture adequate volume Performed at [address] [place], [place] [address]    Culture  Setup Time      GRAM POSITIVE COCCI GRAM NEGATIVE RODS IN BOTH AEROBIC AND ANAEROBIC BOTTLES CRITICAL RESULT CALLED TO, READ BACK BY AND VERIFIED WITH: MATT MCBANE AT [place] ON 12/15/17 [place]. GRAM STAIN REVIEWED-AGREE WITH RESULT Performed at [facility], [place] [address], [place], [place] [address]    Culture GRAM POSITIVE COCCI GRAM NEGATIVE RODS     Report Status PENDING   Blood Culture ID Panel (Reflexed)     Status: Abnormal   Collection Time: 12/14/17  2:27 PM  Result Value Ref Range   Enterococcus species DETECTED (A) NOT DETECTED    Comment: CRITICAL RESULT CALLED TO, READ BACK BY AND VERIFIED WITH: MATT MCBANE AT 0548 ON 12/15/17 [place].    Vancomycin resistance NOT DETECTED NOT DETECTED   Listeria monocytogenes NOT DETECTED NOT DETECTED   Staphylococcus species NOT DETECTED NOT DETECTED   Staphylococcus aureus NOT DETECTED NOT DETECTED   Streptococcus species DETECTED (A) NOT DETECTED    Comment: CRITICAL RESULT CALLED TO, READ BACK BY AND VERIFIED WITH: MATT MCBANE AT 0548 ON 12/15/17 [place].    Streptococcus agalactiae DETECTED (A) NOT DETECTED    Comment: CRITICAL RESULT CALLED TO, READ BACK BY AND VERIFIED WITH: MATT MCBANE AT 0548 ON 12/15/17 [place].    Streptococcus pneumoniae NOT DETECTED NOT DETECTED   Streptococcus pyogenes NOT DETECTED NOT DETECTED   Acinetobacter baumannii NOT DETECTED NOT DETECTED   Enterobacteriaceae species DETECTED (A) NOT DETECTED    Comment: CRITICAL RESULT CALLED TO, READ BACK BY AND VERIFIED WITH: MATT MCBANE AT 0548 ON 12/15/17 [place].    Enterobacter cloacae complex NOT DETECTED NOT DETECTED   Escherichia coli DETECTED (A) NOT DETECTED    Comment: CRITICAL RESULT CALLED TO, READ BACK BY  AND VERIFIED WITH: MATT MCBANE AT 0548 ON 12/15/17 [place].    Klebsiella oxytoca NOT DETECTED NOT DETECTED   Klebsiella pneumoniae NOT DETECTED NOT DETECTED   Proteus species DETECTED (A) NOT DETECTED    Comment: CRITICAL RESULT CALLED TO, READ BACK BY AND VERIFIED WITH: MATT MCBANE AT 0548 ON 12/15/17 [place].    Serratia marcescens NOT DETECTED NOT DETECTED   Carbapenem resistance NOT DETECTED NOT DETECTED   Haemophilus influenzae NOT DETECTED NOT DETECTED   Neisseria meningitidis NOT DETECTED NOT DETECTED   Pseudomonas aeruginosa NOT DETECTED NOT DETECTED   Candida albicans NOT DETECTED NOT DETECTED   Candida glabrata NOT DETECTED NOT DETECTED   Candida krusei NOT DETECTED NOT DETECTED   Candida parapsilosis NOT DETECTED NOT DETECTED   Candida tropicalis NOT DETECTED NOT DETECTED    Comment: Performed at [facility], [address] [place], [place] [address]  Culture, blood (routine x 2)     Status: None (Preliminary result)   Collection Time: 12/14/17  3:29 PM  Result Value Ref Range   Specimen Description BLOOD RIGHT ANTECUBITAL    Special Requests      BOTTLES DRAWN AEROBIC AND ANAEROBIC Blood Culture results may not be optimal due to an inadequate volume of blood received in culture bottles   Culture      NO GROWTH < 24 HOURS Performed at [facility], [address], [place],  [address]    Report Status PENDING   Aerobic Culture (superficial specimen)     Status: None (Preliminary result)   Collection Time: 12/14/17  4:07 PM  Result Value Ref Range   Specimen Description  HIP LEFT Performed at [facility], [address], [place], [place] [address]    Special Requests      NONE Performed at [facility], [place], [place] [address]    Gram Stain      MODERATE WBC PRESENT, PREDOMINANTLY PMN FEW GRAM POSITIVE RODS RARE GRAM POSITIVE COCCI IN PAIRS    Culture      CULTURE REINCUBATED FOR BETTER  GROWTH Performed at [facility], [place] [address] [place], [place] [address]    Report Status PENDING   Aerobic Culture (superficial specimen)     Status: None (Preliminary result)   Collection Time: 12/14/17  4:07 PM  Result Value Ref Range   Specimen Description      WOUND Performed at [facility], [address], [place], [place] [address]    Special Requests      NONE Performed at [facility], [place], [place] [address]    Gram Stain      FEW WBC PRESENT, PREDOMINANTLY PMN ABUNDANT GRAM NEGATIVE RODS MODERATE GRAM POSITIVE COCCI IN PAIRS RARE GRAM POSITIVE RODS    Culture      TOO YOUNG TO READ Performed at [facility], [place] [address] [place], [place] [address]    Report Status PENDING   Aerobic Culture (superficial specimen)     Status: None (Preliminary result)   Collection Time: 12/14/17  4:07 PM  Result Value Ref Range   Specimen Description      WOUND Performed at [facility], [address] [place], [place] [address]    Special Requests      NONE Performed at [facility], [place], [place] [address]    Gram Stain      NO WBC SEEN FEW GRAM POSITIVE COCCI IN PAIRS RARE GRAM NEGATIVE RODS    Culture      TOO YOUNG TO READ Performed at [facility], [place] [address], [place], [place] [address]    Report Status PENDING   MRSA PCR Screening     Status: None   Collection Time: 12/14/17  6:50 PM  Result Value Ref Range   MRSA by PCR NEGATIVE NEGATIVE    Comment:        The GeneXpert MRSA Assay (FDA approved for NASAL specimens only), is one component of a comprehensive MRSA colonization surveillance program. It is not intended to diagnose MRSA infection nor to guide or monitor treatment for MRSA infections. Performed at [facility], [address] [place], [place] [address]   Lactic acid, plasma     Status: None   Collection Time: 12/14/17  7:08  PM  Result Value Ref Range   Lactic Acid, Venous 1.6 0.5 - 1.9 mmol/L    Comment: Performed at [facility], [address] [place], [place] [address]  Procalcitonin     Status: None   Collection Time: 12/14/17  7:08 PM  Result Value Ref Range   Procalcitonin 0.83 ng/mL    Comment:        Interpretation: PCT > 0.5 ng/mL and <= 2 ng/mL: Systemic infection (sepsis) is possible, but other conditions are known to elevate PCT as well. (NOTE)       Sepsis PCT Algorithm           Lower Respiratory Tract  Infection PCT Algorithm    ----------------------------     ----------------------------         PCT < 0.25 ng/mL                PCT < 0.10 ng/mL         Strongly encourage             Strongly discourage   discontinuation of antibiotics    initiation of antibiotics    ----------------------------     -----------------------------       PCT 0.25 - 0.50 ng/mL            PCT 0.10 - 0.25 ng/mL               OR       >80% decrease in PCT            Discourage initiation of                                            antibiotics      Encourage discontinuation           of antibiotics    ----------------------------     -----------------------------         PCT >= 0.50 ng/mL              PCT 0.26 - 0.50 ng/mL                AND       <80% decrease in PCT             Encourage initiation of                                             antibiotics       Encourage continuation           of antibiotics    ----------------------------     -----------------------------        PCT >= 0.50 ng/mL                  PCT > 0.50 ng/mL               AND         increase in PCT                  Strongly encourage                                      initiation of antibiotics    Strongly encourage escalation           of antibiotics                                     -----------------------------                                           PCT <= 0.25 ng/mL  OR                                        > 80% decrease in PCT                                     Discontinue / Do not initiate                                             antibiotics Performed at [facility], [address] [place], [place] [address]   Protime-INR     Status: None   Collection Time: 12/14/17  7:08 PM  Result Value Ref Range   Prothrombin Time 12.8 11.4 - 15.2 seconds   INR 0.97     Comment: Performed at [facility], [address] [place], [place] [address]  APTT     Status: None   Collection Time: 12/14/17  7:08 PM  Result Value Ref Range   aPTT 27 24 - 36 seconds    Comment: Performed at [facility], [address] [place], [place] [address]  Lactic acid, plasma     Status: None   Collection Time: 12/14/17  9:30 PM  Result Value Ref Range   Lactic Acid, Venous 1.9 0.5 - 1.9 mmol/L    Comment: Performed at [facility], [address] [place], [place] [address]  Basic metabolic panel     Status: Abnormal   Collection Time: 12/15/17  4:02 AM  Result Value Ref Range   Sodium 137 135 - 145 mmol/L   Potassium 3.7 3.5 - 5.1 mmol/L   Chloride 109 101 - 111 mmol/L   CO2 24 22 - 32 mmol/L   Glucose, Bld 137 (H) 65 - 99 mg/dL   BUN 18 6 - 20 mg/dL   Creatinine, Ser 0.50 0.44 - 1.00 mg/dL   Calcium 7.4 (L) 8.9 - 10.3 mg/dL   GFR calc non Af Amer >60 >60 mL/min   GFR calc Af Amer >60 >60 mL/min    Comment: (NOTE) The eGFR has been calculated using the CKD EPI equation. This calculation has not been validated in all clinical situations. eGFR's persistently <60 mL/min signify possible Chronic Kidney Disease.    Anion gap 4 (L) 5 - 15    Comment: Performed at [facility], [address] [place], [place] [address]  CBC     Status: Abnormal   Collection Time: 12/15/17  4:02 AM  Result Value Ref Range   WBC 21.9 (H) 3.6 - 11.0 K/uL   RBC 2.38 (L) 3.80 - 5.20  MIL/uL   Hemoglobin 7.7 (L) 12.0 - 16.0 g/dL   HCT 24.0 (L) 35.0 - 47.0 %   MCV 101.1 (H) 80.0 - 100.0 fL   MCH 32.4 26.0 - 34.0 pg   MCHC 32.1 32.0 - 36.0 g/dL   RDW 15.7 (H) 11.5 - 14.5 %   Platelets 367 150 - 440 K/uL    Comment: Performed at [facility], [address], [place], [place] [address]   Dg Chest Port 1 View  Result Date: 12/14/2017 CLINICAL DATA:  Elevated white count EXAM: PORTABLE CHEST 1 VIEW COMPARISON:  03/03/2017  FINDINGS: Minimal atelectasis or scar at the left base. No pleural effusion. Borderline cardiomegaly. No pneumothorax. Pleural and parenchymal scarring at the apices. IMPRESSION: 1. Borderline to mild cardiomegaly without edema 2. Streaky atelectasis or scar at the left base. No definite acute pulmonary infiltrate. Electronically Signed   By: Donavan Foil M.D.   On: 12/14/2017 15:29    Assessment/Plan:  Studies reviewed.  Discussed with family.  They are waiting on an echocardiogram which may help them make a decision.  The patient is currently DNR.  Discussed at length the difficulty of end-of-life discussion and decision making.  They have already decided on DNR but that would have to be rescinded temporarily for debridement.  Untreated this would likely progress to sepsis and not respond to antibiotics.  This was all reviewed with them they wish to think about it overnight and await the results of the echocardiogram which has yet to be done and tomorrow morning I will discuss surgical options with him.  Florene Glen, MD, FACS

## 2017-12-15 NOTE — Progress Notes (Addendum)
Initial Nutrition Assessment  DOCUMENTATION CODES:   Not applicable  INTERVENTION:   Vital Cuisine TID, each supplement provides 520kcal and 22g of protein.   Magic cup TID with meals, each supplement provides 290 kcal and 9 grams of protein  Ocuvite daily for wound healing (provides zinc, vitamin A, vitamin C, Vitamin E, copper, and selenium)  Vitamin C 500mg  BID for wound healing   Dysphagia 1 diet  Assist with meals   NUTRITION DIAGNOSIS:   Increased nutrient needs related to wound healing as evidenced by increased estimated needs from protein.  GOAL:   Patient will meet greater than or equal to 90% of their needs  MONITOR:   PO intake, Supplement acceptance, Labs, Weight trends, Skin, I & O's  REASON FOR ASSESSMENT:   Malnutrition Screening Tool    ASSESSMENT:   76 y.o. female with a known history of GERD, breast cancer, and chronic bilateral hip wounds, chronic sacral wounds, chronic left heel pressure wound presenting from local nursing home via EMS for fevers, altered mental status, decreased responsiveness   Unable to speak with pt today r/t dementia; history obtained from pt's daughter at bedside who reports that at baseline, pt is a good eater. Pt eats a pureed diet where she resides and will eat as long as she is assisted. Pt does not like nutritional supplements but will eat ice cream and sweets. RD will order Magic Cups and vital cuisine when diet advanced. Pt will need a dysphagia 1 diet. Pt currently NPO for possible I & D today. Recommend Ocuvite to encourage wound healing; pt already ordered for vitamin C. Per chart, pt with 17lb(13%) wt loss over the past 8 months; this is significant. Pt's daughter denies any weight loss.   Medications reviewed and include: risaquad, colace, heparin, synthroid, melatonin, meloxicam, B12, vit C, LRS w/ 5% dextrose @100ml /hr, meropenem, vancomycin  Labs reviewed: Ca 7.4(L) Wbc- 21.9(H), Hgb 7.7(L), Hct  24(L)  Nutrition-Focused physical exam completed. Findings are mild fat depletions, moderate muscle depletions, and no edema.   Diet Order:  Diet NPO time specified  EDUCATION NEEDS:   Education needs have been addressed(with family member)  Skin:  Skin Assessment: (chronic bilateral hip wounds, chronic sacral wounds, chronic left heel pressure wound)  Last BM:  PTA  Height:   Ht Readings from Last 1 Encounters:  12/14/17 4' 11.02" (1.499 m)    Weight:   Wt Readings from Last 1 Encounters:  12/14/17 113 lb 15.7 oz (51.7 kg)    Ideal Body Weight:  44.5 kg  BMI:  Body mass index is 23.01 kg/m.  Estimated Nutritional Needs:   Kcal:  1200-1400kcal/day   Protein:  77-87g/day   Fluid:  >1.2L/day   Koleen Distance MS, RD, LDN Pager #[phone] After Hours Pager: [phone]

## 2017-12-15 NOTE — Progress Notes (Signed)
    [facility]    Date of Admission:  12/14/2017   Total days of antibiotics 2           ID: Julia Alvarado is a 76 y.o. female admitted on 12/27 for necrotic hip wound found to have leukocytosis of 24K, polymicrobial bacteremia including group b strep, ecoli, and enterococcus. currently On vanco and meropenem   Assessment/Plan: - recommend to repeat blood cx today to ensure that she is not having persistent bacteremia - please get TTE  - agree with plan to get general surgery to evaluate for debridement/source control.  [facility] Cell: [phone] Pager: [phone]  12/15/2017, 10:18 AM

## 2017-12-15 NOTE — Progress Notes (Signed)
Patients family has been upset throughout the shift as they were waiting for the surgeon to come see their mother to discuss the further plan. RN communicated that MD had been notified multiple times and stated that there were a couple things that he still needed to do but would come round on the patient. 88 RN spoke with MD enough to learn that patient would not be having surgery today and that it would be okay to place a diet order.Family had even communicated that they wanted their mother to be transferred to [facility]. Dr. Vianne Bulls was told about family wanting transfer but did not have time to get started working on it. Dr. Burt Knack did come to round on patient in the late afternoon. Family was given options by MD to be discussed. Family determined that they wanted to proceed with debridement of wound tomorrow as long as results of ECHO come back okay. RN made multiple attempts to get in touch with someone from ECHO was given telephone number for Cardiopulmonary director and she was called. She stated she was unsure if anyone would be here tonight but would attempt to text someone and see if she could get them to come in. Patient also had a wound care consult placed and RN stated she would see the patient after surgery rounded. At end of shift Rn attempted to get in touch with wound care to see if there were any changes that they wanted to make. They did not call back so bedside RN changed all allevyn foam dressing. All of this will be communicated with night shift RN and the importance of need for ECHO to help family to determine whether to proceed with surgery or not.

## 2017-12-15 NOTE — Progress Notes (Signed)
Per Dr. Vianne Bulls okay to place NPO after midnight order so that patient can have surgery tommorow.

## 2017-12-15 NOTE — Consult Note (Signed)
[place] Nurse wound consult note Reason for Consult: Unstageable pressure injury to bilateral hips and sacrum, present on admission.  Surgical consult pending.  Will wait until after surgery sees and makes determination of treatment plan. Wound type: Unstageable pressure injury Pressure Injury POA: Yes [place] team will follow pending surgical consult Domenic Moras RN BSN [facility] Pager [phone]

## 2017-12-15 NOTE — Progress Notes (Signed)
Pharmacy Antibiotic Note  Julia Alvarado is a 76 y.o. female admitted on 12/14/2017 with multiple species bacteremia  (E.coli, proteus, streptococcus, enterococcus).  Pharmacy has been consulted for vancomycin and meropenem dosing.  Pharmacy consulted to dose vancomycin and meropenem in this 76 year old female with multiple species bacteremia. Source of infection is necrotic hip wound. ID following.  Using actual SCr, patient's CrCl = 65 mL/min. Will dose antibiotics accordingly.  Plan: Given change in indication from cellulitis to bacteremia and CrCl = 65 mL/min, will increase dose of antibiotics.   Increase meropenem to 1 g IV q8h  Increase vancomycin to 750 mg IV q18h starting now Goal VT 15-20 mcg/mL VT ordered 12/30  Kinetics: Using adjusted body weight = 48 kg, CrCl 65 mL/min Ke: 0.058 Half-life: 12 hrs Vd: 33 L  Cmin (calculated) ~16 mcg/mL  Height: 4' 11.02" (149.9 cm) Weight: 113 lb 15.7 oz (51.7 kg) IBW/kg (Calculated) : 43.24  Temp (24hrs), Avg:99.2 F (37.3 C), Min:98.3 F (36.8 C), Max:100.1 F (37.8 C)  Recent Labs  Lab 12/14/17 1423 12/14/17 1908 12/14/17 2130 12/15/17 0402  WBC 24.6*  --   --  21.9*  CREATININE 0.72  --   --  0.50  LATICACIDVEN 1.7 1.6 1.9  --     Estimated Creatinine Clearance: 41.4 mL/min (by C-G formula based on SCr of 0.5 mg/dL).    Allergies  Allergen Reactions  . Cefdinir Other (See Comments)    Liquid form causes oral thrush  . Codeine Nausea And Vomiting    nausea  . Ibandronic Acid Nausea Only  . Latex Hives  . Nitrofurantoin     Other reaction(s): Vomiting  . Raloxifene Other (See Comments)    Per pt, "eye doctor said I cannot take" Per pt, "eye doctor said I cannot take" Per pt, "eye doctor said I cannot take"   Antimicrobials this admission: vancomycin 12/27 >>  meropenem 12/28 >>  Piperacillin/tazobactam 12/27 > 12/28  Dose adjustments this admission: 12/28 increased meropenem from 1 g IV q12h to 1 g IV  q8h 12/28 increased vancomycin from 750 mg IV q24h to 750 mg IV q18h  Microbiology results: 12/27 BCx: Proteus, E.coli, streptococcus agalactiae, enterococcus. Sensitivities pending 12/27 Wound: Pending  12/27 MRSA PCR: Negative  Thank you for allowing pharmacy to be a part of this patient's care.  Lenis Noon, PharmD, BCPS Clinical Pharmacist 12/15/2017 10:44 AM

## 2017-12-15 NOTE — Progress Notes (Signed)
Per Dr. Vianne Bulls hold off on oral meds at this time until surgery comes to see patient. Per MD okay to place order for wound care consult at this time.

## 2017-12-15 NOTE — Progress Notes (Addendum)
PHARMACY - PHYSICIAN COMMUNICATION CRITICAL VALUE ALERT - BLOOD CULTURE IDENTIFICATION (BCID)  [name] is an 76 y.o. female who presented to [facility] on 12/14/2017 with a chief complaint of   Assessment:   (include suspected source if known)  Name of physician (or Provider) Contacted: Marcille Blanco  Current antibiotics: vanc, Zosyn  Changes to prescribed antibiotics recommended:  Zosyn changed to meropenem per nomogram  Results for orders placed or performed during the hospital encounter of 12/14/17  Blood Culture ID Panel (Reflexed) (Collected: 12/14/2017  2:27 PM)  Result Value Ref Range   Enterococcus species DETECTED (A) NOT DETECTED   Vancomycin resistance NOT DETECTED NOT DETECTED   Listeria monocytogenes NOT DETECTED NOT DETECTED   Staphylococcus species NOT DETECTED NOT DETECTED   Staphylococcus aureus NOT DETECTED NOT DETECTED   Streptococcus species DETECTED (A) NOT DETECTED   Streptococcus agalactiae DETECTED (A) NOT DETECTED   Streptococcus pneumoniae NOT DETECTED NOT DETECTED   Streptococcus pyogenes NOT DETECTED NOT DETECTED   Acinetobacter baumannii NOT DETECTED NOT DETECTED   Enterobacteriaceae species DETECTED (A) NOT DETECTED   Enterobacter cloacae complex NOT DETECTED NOT DETECTED   Escherichia coli DETECTED (A) NOT DETECTED   Klebsiella oxytoca NOT DETECTED NOT DETECTED   Klebsiella pneumoniae NOT DETECTED NOT DETECTED   Proteus species DETECTED (A) NOT DETECTED   Serratia marcescens NOT DETECTED NOT DETECTED   Carbapenem resistance NOT DETECTED NOT DETECTED   Haemophilus influenzae NOT DETECTED NOT DETECTED   Neisseria meningitidis NOT DETECTED NOT DETECTED   Pseudomonas aeruginosa NOT DETECTED NOT DETECTED   Candida albicans NOT DETECTED NOT DETECTED   Candida glabrata NOT DETECTED NOT DETECTED   Candida krusei NOT DETECTED NOT DETECTED   Candida parapsilosis NOT DETECTED NOT DETECTED   Candida tropicalis NOT DETECTED NOT DETECTED     Blondie Riggsbee S 12/15/2017  5:53 AM

## 2017-12-15 NOTE — Progress Notes (Signed)
Per Dr. Vianne Bulls okay to place patient on a puree diet as she is to be seen by surgery but something dysphagia 1.

## 2017-12-15 NOTE — Progress Notes (Signed)
[place] [facility] [place] NAME: Julia Alvarado    MR#:  [id number]  DATE OF BIRTH:  [birth year]  SUBJECTIVE: Patient admitted for sepsis secondary to chronic wounds.  Patient has severe dementia, unable to give any history.  History obtained from family members.  Patient is been having this infection in the sacrum nd also hips for the last 2 months.  CHIEF COMPLAINT:   Chief Complaint  Patient presents with  . Fever  Consent from nursing home because of fever, increased drainage from wounds.  A course of Keflex during Christmas time. REVIEW OF SYSTEMS:   Severe dementia and also constantly mumbling  DRUG ALLERGIES:   Allergies  Allergen Reactions  . Cefdinir Other (See Comments)    Liquid form causes oral thrush  . Codeine Nausea And Vomiting    nausea  . Ibandronic Acid Nausea Only  . Latex Hives  . Nitrofurantoin     Other reaction(s): Vomiting  . Raloxifene Other (See Comments)    Per pt, "eye doctor said I cannot take" Per pt, "eye doctor said I cannot take" Per pt, "eye doctor said I cannot take"    VITALS:  Blood pressure (!) 129/44, pulse 98, temperature 100.1 F (37.8 C), temperature source Oral, resp. rate (!) 24, height 4' 11.02" (1.499 m), weight 51.7 kg (113 lb 15.7 oz), SpO2 97 %.  PHYSICAL EXAMINATION:  GENERAL:  76 y.o.-year-old patient lying in the bed with no acute distress.  EYES: Pupils equal, round, reactive to light  No scleral icterus. Extraocular muscles intact.  HEENT: Head atraumatic, normocephalic. Oropharynx and nasopharynx clear.  NECK:  Supple, no jugular venous distention. No thyroid enlargement, no tenderness.  LUNGS: Normal breath sounds bilaterally, no wheezing, rales,rhonchi or crepitation. No use of accessory muscles of respiration.  CARDIOVASCULAR: S1, S2 normal. No murmurs, rubs, or gallops.  ABDOMEN: Soft, nontender, nondistended. Bowel sounds present. No organomegaly or mass.   EXTREMITIES: Contractures of lower extremities and upper extremities. NEUROLOGIC: Unable to do full neurological exam because of dementia and not following commands.  PSYCHIATRIC: T demented, not oriented and nonverbal. 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 LABORATORY PANEL:   CBC Recent Labs  Lab 12/15/17 0402  WBC 21.9*  HGB 7.7*  HCT 24.0*  PLT 367   ------------------------------------------------------------------------------------------------------------------  Chemistries  Recent Labs  Lab 12/15/17 0402  NA 137  K 3.7  CL 109  CO2 24  GLUCOSE 137*  BUN 18  CREATININE 0.50  CALCIUM 7.4*   ------------------------------------------------------------------------------------------------------------------  Cardiac Enzymes Recent Labs  Lab 12/14/17 1423  TROPONINI 0.04*   ------------------------------------------------------------------------------------------------------------------  RADIOLOGY:  Dg Chest Port 1 View  Result Date: 12/14/2017 CLINICAL DATA:  Elevated white count EXAM: PORTABLE CHEST 1 VIEW COMPARISON:  03/03/2017 FINDINGS: Minimal atelectasis or scar at the left base. No pleural effusion. Borderline cardiomegaly. No pneumothorax. Pleural and parenchymal scarring at the apices. IMPRESSION: 1. Borderline to mild cardiomegaly without edema 2. Streaky atelectasis or scar at the left base. No definite acute pulmonary infiltrate. Electronically Signed   By: Donavan Foil M.D.   On: 12/14/2017 15:29    EKG:   Orders placed or performed during the hospital encounter of 03/03/17  . ED EKG  . ED EKG  . EKG 12-Lead  . EKG 12-Lead  . EKG 12-Lead  . EKG 12-Lead  . EKG 12-Lead  .  EKG 12-Lead  . EKG 12-Lead  . EKG 12-Lead  . EKG 12-Lead  . EKG 12-Lead  . EKG    ASSESSMENT AND PLAN:   #1 sepsis present on  admission secondary to necrotic hip wounds: Enterococcus and strep ferritins bacteremia l;patient back blood culture showed strep viridans, enterococci, patient is on vancomycin, meropenem, seen by ID Dr. Evelina Bucy recommended repeat blood cultures, echocardiogram: Discussed with family about this, ordered echocardiogram, repeat blood cultures, continue aggressive antibiotic therapy, surgical consult requested for possible debridement of hip wounds.  Discussed with family and family is eagerly waiting to meet with Dr. Burt Knack.    #2 Alzheimer's dementia: Patient mumbling at baseline, nonverbal, his body contractures.  Continue IV fluids as she is n.p.o., patient is on pured diet nursing home.  #3 essential hypertension: Patient is on home BP medicines 4.  DNR Prognosis is really poor secondary to advanced age, dementia Discussed with patient's daughter, son,  All the records are reviewed and case discussed with Care Management/Social Workerr. Management plans discussed with the patient, family and they are in agreement.  CODE STATUS: DNR  TOTAL TIME TAKING CARE OF THIS PATIENT: 35 minutes.   POSSIBLE D/C IN 1-2 DAYS, DEPENDING ON CLINICAL CONDITION.   Epifanio Lesches M.D on 12/15/2017 at 1:04 PM  Between 7am to 6pm - Pager - [phone]  After 6pm go to [email or web address] - password [email or web address] [facility]  Office  [phone]  CC: Primary care physician; Valera Castle, MD   Note: This dictation was prepared with Dragon dictation along with smaller phrase technology. Any transcriptional errors that result from this process are unintentional.

## 2017-12-16 ENCOUNTER — Inpatient Hospital Stay: Payer: Medicare Other | Admitting: Certified Registered Nurse Anesthetist

## 2017-12-16 ENCOUNTER — Encounter
Admission: EM | Disposition: A | Discharge status: Hospice | Payer: Self-pay | Source: Home / Self Care | Attending: Internal Medicine

## 2017-12-16 ENCOUNTER — Inpatient Hospital Stay
Admit: 2017-12-16 | Discharge: 2017-12-16 | Disposition: A | Discharge status: Home / Self Care | Payer: Medicare Other | Attending: Internal Medicine | Admitting: Internal Medicine

## 2017-12-16 DIAGNOSIS — L89159 Pressure ulcer of sacral region, unspecified stage: Secondary | ICD-10-CM

## 2017-12-16 DIAGNOSIS — L8990 Pressure ulcer of unspecified site, unspecified stage: Secondary | ICD-10-CM

## 2017-12-16 HISTORY — PX: INCISION AND DRAINAGE ABSCESS: SHX5864

## 2017-12-16 LAB — HEMOGLOBIN AND HEMATOCRIT, BLOOD
HCT: 23.7 % — ABNORMAL LOW (ref 35.0–47.0)
HEMOGLOBIN: 7.5 g/dL — AB (ref 12.0–16.0)

## 2017-12-16 LAB — BASIC METABOLIC PANEL
Anion gap: 7 (ref 5–15)
BUN: 11 mg/dL (ref 6–20)
CO2: 25 mmol/L (ref 22–32)
CREATININE: 0.51 mg/dL (ref 0.44–1.00)
Calcium: 7.7 mg/dL — ABNORMAL LOW (ref 8.9–10.3)
Chloride: 105 mmol/L (ref 101–111)
GFR calc non Af Amer: >60 mL/min (ref >60)
Glucose, Bld: 127 mg/dL — ABNORMAL HIGH (ref 65–99)
Potassium: 3.6 mmol/L (ref 3.5–5.1)
Sodium: 137 mmol/L (ref 135–145)

## 2017-12-16 LAB — ABO/RH: ABO/RH(D): A POS

## 2017-12-16 LAB — CBC
HEMATOCRIT: 22 % — AB (ref 35.0–47.0)
Hemoglobin: 7 g/dL — ABNORMAL LOW (ref 12.0–16.0)
MCH: 32.5 pg (ref 26.0–34.0)
MCHC: 32 g/dL (ref 32.0–36.0)
MCV: 101.4 fL — ABNORMAL HIGH (ref 80.0–100.0)
PLATELETS: 341 10*3/uL (ref 150–440)
RBC: 2.17 MIL/uL — ABNORMAL LOW (ref 3.80–5.20)
RDW: 15.7 % — AB (ref 11.5–14.5)
WBC: 19.2 10*3/uL — ABNORMAL HIGH (ref 3.6–11.0)

## 2017-12-16 LAB — ECHOCARDIOGRAM COMPLETE
Height: 59.016 in
Weight: 1823.65 oz

## 2017-12-16 SURGERY — INCISION AND DRAINAGE, ABSCESS
Anesthesia: General | Laterality: Bilateral | Wound class: Dirty or Infected

## 2017-12-16 MED ORDER — SILVER SULFADIAZINE 1 % EX CREA
TOPICAL_CREAM | CUTANEOUS | Status: AC
  Filled 2017-12-16: qty 85

## 2017-12-16 MED ORDER — ONDANSETRON HCL 4 MG/2ML IJ SOLN
INTRAMUSCULAR | Status: DC | PRN
  Administered 2017-12-16: 4 mg via INTRAVENOUS

## 2017-12-16 MED ORDER — SODIUM CHLORIDE 0.9 % IV SOLN: Freq: Once | INTRAVENOUS | Status: DC

## 2017-12-16 MED ORDER — PHENYLEPHRINE HCL 10 MG/ML IJ SOLN
INTRAMUSCULAR | Status: DC | PRN
  Administered 2017-12-16: 200 ug via INTRAVENOUS
  Administered 2017-12-16: 100 ug via INTRAVENOUS

## 2017-12-16 MED ORDER — FENTANYL CITRATE (PF) 100 MCG/2ML IJ SOLN
INTRAMUSCULAR | Status: AC
  Filled 2017-12-16: qty 2

## 2017-12-16 MED ORDER — LIDOCAINE HCL (CARDIAC) 20 MG/ML IV SOLN
INTRAVENOUS | Status: DC | PRN
  Administered 2017-12-16: 40 mg via INTRAVENOUS

## 2017-12-16 MED ORDER — LACTATED RINGERS IV SOLN
INTRAVENOUS | Status: DC | PRN
  Administered 2017-12-16: 14:00:00 via INTRAVENOUS

## 2017-12-16 MED ORDER — SUCCINYLCHOLINE CHLORIDE 20 MG/ML IJ SOLN
INTRAMUSCULAR | Status: DC | PRN
  Administered 2017-12-16: 80 mg via INTRAVENOUS

## 2017-12-16 MED ORDER — PROPOFOL 10 MG/ML IV BOLUS
INTRAVENOUS | Status: AC
  Filled 2017-12-16: qty 20

## 2017-12-16 MED ORDER — SILVER SULFADIAZINE 1 % EX CREA
TOPICAL_CREAM | CUTANEOUS | Status: DC | PRN
  Administered 2017-12-16: 1 via TOPICAL

## 2017-12-16 MED ORDER — PROPOFOL 10 MG/ML IV BOLUS
INTRAVENOUS | Status: DC | PRN
  Administered 2017-12-16: 60 mg via INTRAVENOUS

## 2017-12-16 MED ORDER — FENTANYL CITRATE (PF) 100 MCG/2ML IJ SOLN
INTRAMUSCULAR | Status: DC | PRN
  Administered 2017-12-16 (×2): 50 ug via INTRAVENOUS

## 2017-12-16 SURGICAL SUPPLY — 21 items
BLADE SURG 15 STRL LF DISP TIS (BLADE) ×1 IMPLANT
BLADE SURG 15 STRL SS (BLADE) ×2
CHLORAPREP W/TINT 10.5 ML (MISCELLANEOUS) ×3 IMPLANT
DRAIN PENROSE 1/4X12 LTX (DRAIN) ×3 IMPLANT
DRAPE LAPAROTOMY 100X77 ABD (DRAPES) ×3 IMPLANT
DRAPE UTILITY 15X26 TOWEL STRL (DRAPES) ×3 IMPLANT
ELECT CAUTERY BLADE 6.4 (BLADE) ×3 IMPLANT
ELECT REM PT RETURN 9FT ADLT (ELECTROSURGICAL) ×3
ELECTRODE REM PT RTRN 9FT ADLT (ELECTROSURGICAL) ×1 IMPLANT
GAUZE SPONGE 4X4 12PLY STRL (GAUZE/BANDAGES/DRESSINGS) ×6 IMPLANT
GLOVE BIO SURGEON STRL SZ8 (GLOVE) ×9 IMPLANT
GOWN STRL REUS W/ TWL LRG LVL3 (GOWN DISPOSABLE) ×1 IMPLANT
GOWN STRL REUS W/TWL LRG LVL3 (GOWN DISPOSABLE) ×2
KIT RM TURNOVER STRD PROC AR (KITS) ×3 IMPLANT
NEEDLE HYPO 25X1 1.5 SAFETY (NEEDLE) ×3 IMPLANT
NS IRRIG 500ML POUR BTL (IV SOLUTION) ×3 IMPLANT
PACK BASIN MINOR ARMC (MISCELLANEOUS) ×3 IMPLANT
PAD ABD DERMACEA PRESS 5X9 (GAUZE/BANDAGES/DRESSINGS) ×9 IMPLANT
SPONGE LAP 18X18 5 PK (GAUZE/BANDAGES/DRESSINGS) ×3 IMPLANT
SYR 10ML LL (SYRINGE) ×3 IMPLANT
SYR BULB 3OZ (MISCELLANEOUS) ×3 IMPLANT

## 2017-12-16 NOTE — Progress Notes (Signed)
Lab called with anerobic bottle of second blood cx positive for GNR- Biofire not done since first set positive and run. Text page sent to Dr. Vianne Bulls, No changes to abx are needed at this time.  Ramond Dial, Pharm.D, BCPS Clinical Pharmacist

## 2017-12-16 NOTE — Anesthesia Procedure Notes (Signed)
Procedure Name: Intubation Performed by: Cicily Bonano, CRNA Pre-anesthesia Checklist: Patient identified, Patient being monitored, Timeout performed, Emergency Drugs available and Suction available Patient Re-evaluated:Patient Re-evaluated prior to induction Oxygen Delivery Method: Circle system utilized Preoxygenation: Pre-oxygenation with 100% oxygen Induction Type: IV induction Ventilation: Mask ventilation without difficulty Laryngoscope Size: Mac and 3 Grade View: Grade I Tube type: Oral Tube size: 7.0 mm Number of attempts: 1 Airway Equipment and Method: Stylet Placement Confirmation: ETT inserted through vocal cords under direct vision,  positive ETCO2 and breath sounds checked- equal and bilateral Secured at: 21 cm Tube secured with: Tape Dental Injury: Teeth and Oropharynx as per pre-operative assessment        

## 2017-12-16 NOTE — Anesthesia Post-op Follow-up Note (Signed)
Anesthesia QCDR form completed.        

## 2017-12-16 NOTE — Op Note (Signed)
12/16/2017  2:50 PM  PATIENT:  Julia Alvarado  76 y.o. female  PRE-OPERATIVE DIAGNOSIS: Multiple pressure ulcers  POST-OPERATIVE DIAGNOSIS: Same  PROCEDURE: Debridement of multiple pressure ulcers  SURGEON:  Florene Glen MD, FACS  ANESTHESIA:   General with endotracheal tube   Details of Procedure: This patient with multiple pressure ulcers.  One large on the sacrum and one on each hip.  They are all septic and require unroofing and debridement.  Preoperatively her family and I discussed the rationale for offering this and the risk of bleeding infection and the likelihood of a non-healing wound and an open wound with wound care is all reviewed for them they understood and agreed to proceed  Pt was brought to the OR and induced to Gen anesthesia. Placed in well padded prone position. Surgical Pause held. Pt was prepped and draped.  Rt hip was inspected and a 4x4 cm wound was opened and sharply debrided to muscle with electrocautery. Cultures obtained.  Sacral wound unroofed of eschar. Dead tissue with pus found below. Measures 11x7 cm. Rough and sharp debridement performed with lap and caqutery.  Left hip wound identified with small purulent draining sinus. Sinus opened and tracked to the femur and greater trochanter.Bone was palpable. A large cavity over the bone was present.  Into the cavity was placed a penrose drain after sharp debridement was performed. Wound measured 5x4cm. The drain was sutured with 3-0 nylon.  Silvadene was placed on allwounds and ABD pads placed.  Cultures obtained from all wounds. To PACU in stable condition.      Florene Glen, MD FACS

## 2017-12-16 NOTE — Progress Notes (Signed)
Visited patient this morning no change in mental status. No change in exam.  Discussed with patient's family that we would await the findings of the echocardiogram and make a decision based on that echocardiogram.  They stated that they prefer to proceed with surgery if possible.    Addendum Dr. Vianne Bulls notified me that the echocardiogram was normal.  With that in mind and patient's family statements about wanting to proceed with surgery I will schedule her for later today.  I have discussed with him previously and will review again the rationale and the options as well as the risks of nonhealing wound which is entirely likely and a suspension of DNR status perioperatively.

## 2017-12-16 NOTE — Anesthesia Preprocedure Evaluation (Addendum)
Anesthesia Evaluation  Patient identified by MRN, date of birth, ID band Patient awake    Reviewed: Allergy & Precautions, H&P , NPO status , Patient's Chart, lab work & pertinent test results, reviewed documented beta blocker date and time   Airway Mallampati: II  TM Distance: <3 FB Neck ROM: full    Dental no notable dental hx. (+) Edentulous Upper, Edentulous Lower   Pulmonary neg pulmonary ROS,    Pulmonary exam normal breath sounds clear to auscultation       Cardiovascular Exercise Tolerance: Poor hypertension, On Medications negative cardio ROS Normal cardiovascular exam Rhythm:regular Rate:Normal     Neuro/Psych PSYCHIATRIC DISORDERS negative neurological ROS  negative psych ROS   GI/Hepatic negative GI ROS, Neg liver ROS, GERD  Medicated,  Endo/Other  negative endocrine ROSdiabetesHypothyroidism   Renal/GU negative Renal ROS  negative genitourinary   Musculoskeletal   Abdominal   Peds  Hematology negative hematology ROS (+)   Anesthesia Other Findings   Reproductive/Obstetrics negative OB ROS                            Anesthesia Physical Anesthesia Plan  ASA: III and emergent  Anesthesia Plan: General ETT   Post-op Pain Management:    Induction:   PONV Risk Score and Plan:   Airway Management Planned:   Additional Equipment:   Intra-op Plan:   Post-operative Plan:   Informed Consent: I have reviewed the patients History and Physical, chart, labs and discussed the procedure including the risks, benefits and alternatives for the proposed anesthesia with the patient or authorized representative who has indicated his/her understanding and acceptance.     Plan Discussed with: CRNA  Anesthesia Plan Comments: (Pt noncommunicative and unable to give history, which was taken from chart.  Son provided consent and plan is emergent GOT for IandD.  JA)       Anesthesia  Quick Evaluation

## 2017-12-16 NOTE — Progress Notes (Signed)
Discussed care with the patient's family (daughter?).  Discussed rationale and the risks of bleeding infection nonhealing wound which is likely.  She understood and had already signed the consent.  Planning for debridement later today.            0

## 2017-12-16 NOTE — Transfer of Care (Signed)
Immediate Anesthesia Transfer of Care Note  Patient: Julia Alvarado  Procedure(s) Performed: debridement multiple decubitus ulcers (Bilateral )  Patient Location: PACU  Anesthesia Type:General  Level of Consciousness: sedated  Airway & Oxygen Therapy: Patient Spontanous Breathing and Patient connected to face mask oxygen  Post-op Assessment: Report given to RN and Post -op Vital signs reviewed and stable  Post vital signs: Reviewed and stable  Last Vitals:  Vitals:   12/16/17 0540 12/16/17 1443  BP: (!) 128/51 (!) 132/54  Pulse: 85 82  Resp: 16 (!) 25  Temp: (!) 36.3 C 37.4 C  SpO2: 91% 97%    Last Pain:  Vitals:   12/16/17 1108  TempSrc:   PainSc: Asleep         Complications: No apparent anesthesia complications

## 2017-12-16 NOTE — Progress Notes (Signed)
Met with family members in the patient's room.  I notified them of my findings on the left hip of probable bone involvement and possible hardware involvement.  Large cavity that originated from the sinus tract was described.  Cultures have been obtained.  I will discussed this with Dr. Rudene Christians who repaired her hip fracture in March once he is available.

## 2017-12-16 NOTE — Progress Notes (Signed)
[facility] [place] NAME: Shontay Wallner    MR#:  [id number]  DATE OF BIRTH:  October 16, 1941  SUBJECTIVE: Patient admitted for sepsis secondary to infected decubiti.  Dementia, unable to give any history.  History obtained from family members.  Patient is been having this infection in the sacrum nd also hips for the last 2 months. Fever are coming down, echocardiogram is normal without any vegetations, family is interested in surgical debridement of the wounds.  Talk with patient's son.  Discussed echo results with Dr. Burt Knack.  CHIEF COMPLAINT:   Chief Complaint  Patient presents with  . Fever   REVIEW OF SYSTEMS:   Severe dementia and also constantly mumbling  DRUG ALLERGIES:   Allergies  Allergen Reactions  . Cefdinir Other (See Comments)    Liquid form causes oral thrush  . Codeine Nausea And Vomiting    nausea  . Ibandronic Acid Nausea Only  . Latex Hives  . Nitrofurantoin     Other reaction(s): Vomiting  . Raloxifene Other (See Comments)    Per pt, "eye doctor said I cannot take" Per pt, "eye doctor said I cannot take" Per pt, "eye doctor said I cannot take"    VITALS:  Blood pressure (!) 128/51, pulse 85, temperature (!) 97.3 F (36.3 C), temperature source Oral, resp. rate 16, height 4' 11.02" (1.499 m), weight 51.7 kg (113 lb 15.7 oz), SpO2 91 %.  PHYSICAL EXAMINATION:  GENERAL:  76 y.o.-year-old patient lying in the bed with no acute distress.  EYES: Pupils equal, round, reactive to light  No scleral icterus. Extraocular muscles intact.  HEENT: Head atraumatic, normocephalic. Oropharynx and nasopharynx clear.  NECK:  Supple, no jugular venous distention. No thyroid enlargement, no tenderness.  LUNGS: Normal breath sounds bilaterally, no wheezing, rales,rhonchi or crepitation. No use of accessory muscles of respiration.  CARDIOVASCULAR: S1, S2 normal. No murmurs, rubs, or gallops.  ABDOMEN: Soft, nontender,  nondistended. Bowel sounds present. No organomegaly or mass.  EXTREMITIES: Contractures of lower extremities and upper extremities. NEUROLOGIC: Unable to do full neurological exam because of dementia and not following commands.  PSYCHIATRIC: T demented, not oriented and nonverbal. 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 LABORATORY PANEL:   CBC Recent Labs  Lab 12/16/17 0522  WBC 19.2*  HGB 7.0*  HCT 22.0*  PLT 341   ------------------------------------------------------------------------------------------------------------------  Chemistries  Recent Labs  Lab 12/16/17 0522  NA 137  K 3.6  CL 105  CO2 25  GLUCOSE 127*  BUN 11  CREATININE 0.51  CALCIUM 7.7*   ------------------------------------------------------------------------------------------------------------------  Cardiac Enzymes Recent Labs  Lab 12/14/17 1423  TROPONINI 0.04*   ------------------------------------------------------------------------------------------------------------------  RADIOLOGY:  Dg Chest Port 1 View  Result Date: 12/14/2017 CLINICAL DATA:  Elevated white count EXAM: PORTABLE CHEST 1 VIEW COMPARISON:  03/03/2017 FINDINGS: Minimal atelectasis or scar at the left base. No pleural effusion. Borderline cardiomegaly. No pneumothorax. Pleural and parenchymal scarring at the apices. IMPRESSION: 1. Borderline to mild cardiomegaly without edema 2. Streaky atelectasis or scar at the left base. No definite acute pulmonary infiltrate. Electronically Signed   By: Donavan Foil M.D.   On: 12/14/2017 15:29    EKG:   Orders placed or performed during the hospital encounter of 03/03/17  . ED EKG  . ED EKG  . EKG 12-Lead  . EKG 12-Lead  . EKG  12-Lead  . EKG 12-Lead  . EKG 12-Lead  . EKG 12-Lead  . EKG 12-Lead  . EKG 12-Lead  . EKG 12-Lead  . EKG 12-Lead   . EKG    ASSESSMENT AND PLAN:   #1  Polymicrobial bacteremia including group B strep, E. coli, enterococci, Proteus species.  Lsecondary to infected decubiti.  Need surgical debridement, family wanted surgical debridement, echocardiogram did not show any vegetations, Dr. Burt Knack from surgery has seen the patient, discussed with patient's family also about poor prognosis, family still wants to proceed with surgical debridement of the decubiti.  Continue IV vancomycin, meropenem, WBC is coming down, patient has really poor prognosis because of advanced dementia,.    #2 Alzheimer's dementia: Patient mumbling at baseline, nonverbal, his body contractures.  Continue IV fluids as she is n.p.o., patient is on pured diet nursing home  .  #3 essential hypertension: Patient is on home BP medicines 4.  DNR  #5 dilutional anemia: Patient hemoglobin 7.  No indication for transfusion at this time decrease IV fluids and then see how she does monitor hemoglobins closely, check stool for guaiac.   Prognosis is really poor secondary to advanced age, dementia Discussed with patient's daughter, son,    All the records are reviewed and case discussed with Care Management/Social Workerr. Management plans discussed with the patient, family and they are in agreement.  CODE STATUS: DNR  TOTAL TIME TAKING CARE OF THIS PATIENT: 35 minutes.   POSSIBLE D/C IN 1-2 DAYS, DEPENDING ON CLINICAL CONDITION.   Epifanio Lesches M.D on 12/16/2017 at 10:00 AM  Between 7am to 6pm - Pager - [phone]  After 6pm go to [email or web address] - password [email or web address] [facility]  Office  [phone]  CC: Primary care physician; Valera Castle, MD   Note: This dictation was prepared with Dragon dictation along with smaller phrase technology. Any transcriptional errors that result from this process are unintentional.

## 2017-12-17 ENCOUNTER — Encounter: Payer: Self-pay | Admitting: Surgery

## 2017-12-17 LAB — BASIC METABOLIC PANEL
ANION GAP: 4 — AB (ref 5–15)
BUN: 8 mg/dL (ref 6–20)
CALCIUM: 7.7 mg/dL — AB (ref 8.9–10.3)
CO2: 28 mmol/L (ref 22–32)
Chloride: 105 mmol/L (ref 101–111)
Creatinine, Ser: 0.52 mg/dL (ref 0.44–1.00)
GFR calc Af Amer: >60 mL/min (ref >60)
Glucose, Bld: 105 mg/dL — ABNORMAL HIGH (ref 65–99)
POTASSIUM: 3.7 mmol/L (ref 3.5–5.1)
SODIUM: 137 mmol/L (ref 135–145)

## 2017-12-17 LAB — CBC
HCT: 21.4 % — ABNORMAL LOW (ref 35.0–47.0)
Hemoglobin: 7 g/dL — ABNORMAL LOW (ref 12.0–16.0)
MCH: 33.4 pg (ref 26.0–34.0)
MCHC: 32.8 g/dL (ref 32.0–36.0)
MCV: 101.8 fL — ABNORMAL HIGH (ref 80.0–100.0)
PLATELETS: 314 10*3/uL (ref 150–440)
RBC: 2.1 MIL/uL — AB (ref 3.80–5.20)
RDW: 15.5 % — AB (ref 11.5–14.5)
WBC: 13.5 10*3/uL — AB (ref 3.6–11.0)

## 2017-12-17 LAB — AEROBIC CULTURE  (SUPERFICIAL SPECIMEN)

## 2017-12-17 LAB — AEROBIC CULTURE W GRAM STAIN (SUPERFICIAL SPECIMEN)

## 2017-12-17 LAB — URINE CULTURE

## 2017-12-17 LAB — VANCOMYCIN, TROUGH: Vancomycin Tr: 8 ug/mL — ABNORMAL LOW (ref 15–20)

## 2017-12-17 MED ORDER — SILVER SULFADIAZINE 1 % EX CREA
TOPICAL_CREAM | Freq: Every day | CUTANEOUS | Status: DC
  Administered 2017-12-18: 15:00:00 via TOPICAL
  Filled 2017-12-17 (×2): qty 85

## 2017-12-17 MED ORDER — VANCOMYCIN HCL IN DEXTROSE 750-5 MG/150ML-% IV SOLN
750.0000 mg | Freq: Two times a day (BID) | INTRAVENOUS | Status: DC
  Administered 2017-12-17 – 2017-12-18 (×2): 750 mg via INTRAVENOUS
  Filled 2017-12-17 (×3): qty 150

## 2017-12-17 NOTE — Progress Notes (Signed)
[facility] [place] NAME: Julia Alvarado    MR#:  [id number]  DATE OF BIRTH:  08-03-1941  SUBJECTIVE: Patient admitted for sepsis secondary to infected decubiti.  Dementia, unable to give any history.  Patient had surgical debridement of necrotic sacral ulcer, right and left hip decubiti yesterday.  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  Dr. Burt Alvarado discussed the findings with the patient's son.  CHIEF COMPLAINT:   Chief Complaint  Patient presents with  . Fever   REVIEW OF SYSTEMS:   Severe dementia and also constantly mumbling  According to family she is more alert today.  DRUG ALLERGIES:   Allergies  Allergen Reactions  . Cefdinir Other (See Comments)    Liquid form causes oral thrush  . Codeine Nausea And Vomiting    nausea  . Ibandronic Acid Nausea Only  . Latex Hives  . Nitrofurantoin     Other reaction(s): Vomiting  . Raloxifene Other (See Comments)    Per pt, "eye doctor said I cannot take" Per pt, "eye doctor said I cannot take" Per pt, "eye doctor said I cannot take"    VITALS:  Blood pressure (!) 115/51, pulse 79, temperature 98.1 F (36.7 C), temperature source Oral, resp. rate 16, height 4' 11.02" (1.499 m), weight 51.7 kg (113 lb 15.7 oz), SpO2 97 %.  PHYSICAL EXAMINATION:  GENERAL:  76 y.o.-year-old patient lying in the bed with no acute distress.  EYES: Pupils equal, round, reactive to light  No scleral icterus. Extraocular muscles intact.  HEENT: Head atraumatic, normocephalic. Oropharynx and nasopharynx clear.  NECK:  Supple, no jugular venous distention. No thyroid enlargement, no tenderness.  LUNGS: Normal breath sounds bilaterally, no wheezing, rales,rhonchi or crepitation. No use of accessory muscles of respiration.  CARDIOVASCULAR: S1, S2 normal. No murmurs, rubs, or gallops.  ABDOMEN: Soft, nontender,  nondistended. Bowel sounds present. No organomegaly or mass.  EXTREMITIES: Contractures of lower extremities and upper extremities. NEUROLOGIC: Unable to do full neurological exam because of dementia and not following commands.  PSYCHIATRIC: T demented, not oriented and nonverbal. 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 LABORATORY PANEL:   CBC Recent Labs  Lab 12/17/17 0424  WBC 13.5*  HGB 7.0*  HCT 21.4*  PLT 314   ------------------------------------------------------------------------------------------------------------------  Chemistries  Recent Labs  Lab 12/17/17 0424  NA 137  K 3.7  CL 105  CO2 28  GLUCOSE 105*  BUN 8  CREATININE 0.52  CALCIUM 7.7*   ------------------------------------------------------------------------------------------------------------------  Cardiac Enzymes Recent Labs  Lab 12/14/17 1423  TROPONINI 0.04*   ------------------------------------------------------------------------------------------------------------------  RADIOLOGY:  No results found.  EKG:   Orders placed or performed during the hospital encounter of 03/03/17  . ED EKG  . ED EKG  . EKG 12-Lead  . EKG 12-Lead  . EKG 12-Lead  . EKG 12-Lead  . EKG 12-Lead  . EKG 12-Lead  . EKG 12-Lead  . EKG 12-Lead  . EKG 12-Lead  . EKG 12-Lead  . EKG    ASSESSMENT AND PLAN:   #1  Polymicrobial bacteremia including group B strep, E. coli, enterococci, Proteus species.  secondary to infected decubiti.   Status post debridement of multiple times including right hip decubiti, sacral decubiti, left hip decubiti, purulent drainage drained from left hip,  patient has sinus tract on the left femur and greater trochanter.  Sizes are documented in the OR note by Dr. Burt Alvarado. Follow wound cultures from the OR.  #2 Alzheimer's dementia: Patient  mumbling at baseline, nonverbal, his body contractures.  Continue IV fluids as she is n.p.o., patient is on pured diet nursing home  .  #3 essential hypertension: Patient is on home BP medicines 4.  DNR  #5 dilutional anemia: Patient hemoglobin 7.  Status post 1 unit of transfusion.  Secondary to surgery, debridement.  Monitor closely.     Prognosis is really poor secondary to advanced age, dementia, recommend palliative care., patient's family wants everything to be done Discussed with patient's daughter, son,    All the records are reviewed and case discussed with Care Management/Social Workerr. Management plans discussed with the patient, family and they are in agreement.  CODE STATUS: DNR  TOTAL TIME TAKING CARE OF THIS PATIENT: 35 minutes.   POSSIBLE D/C IN 1-2 DAYS, DEPENDING ON CLINICAL CONDITION.   Julia Alvarado M.D on 12/17/2017 at 12:03 PM  Between 7am to 6pm - Pager - [phone]  After 6pm go to [email or web address] - password [email or web address] [facility]  Office  [phone]  CC: Primary care physician; Julia Castle, MD   Note: This dictation was prepared with Dragon dictation along with smaller phrase technology. Any transcriptional errors that result from this process are unintentional.

## 2017-12-17 NOTE — Clinical Social Work Note (Signed)
Clinical Social Work Assessment  Patient Details  Name: Julia Alvarado MRN: [id number] Date of Birth: Jan 05, 1941  Date of referral:  12/17/17               Reason for consult:  Facility Placement                Permission sought to share information with:  Facility Art therapist granted to share information::  Yes, Verbal Permission Granted  Name::        Agency::     Relationship::     Contact Information:     Housing/Transportation Living arrangements for the past 2 months:  [place] of Information:  Medical Team, [place], Adult Children Patient Interpreter Needed:  None Criminal Activity/Legal Involvement Pertinent to Current Situation/Hospitalization:  No - Comment as needed Significant Relationships:  Adult Children, Community Support Lives with:  Facility Resident Do you feel safe going back to the place where you live?  Yes Need for family participation in patient care:  Yes (Comment)(Patient unable to make decisions)  Care giving concerns: Admitted from and ALF  Social Worker assessment / plan:  CSW met with the family at bedside to discuss discharge planning. The family indicated that they are unsure if the patient would be able to return to her ALF ([facility]) due to ongoing medical problems. They had discussed the option of returning to the ALF with hospice following with the [facility]. They also had questions about possible LTC at a SNF. The CSW advised that if they choose that route, hospice would not be able to follow the patient, but palliative care would do so. The family understood. The CSW also explained the financial barriers due to lack of LTC insurance. The family is willing to pursue LTC Medicaid. The family requested a referral be sent "to cover all bases".  The CSW has sent the referral for LTC to area SNFs. The family is aware that this referral may need to be expanded. CSW will continue to  follow.   Employment status:  Retired Forensic scientist:  Medicare PT Recommendations:  Not assessed at this time [place] / Referral to community resources:  [place]  Patient/Family's Response to care:  The family thanked the CSW for information and assistance.  Patient/Family's Understanding of and Emotional Response to Diagnosis, Current Treatment, and Prognosis:  The family understands the changing needs for the patient and are exploring all options.  Emotional Assessment Appearance:  Appears older than stated age Attitude/Demeanor/Rapport:  Lethargic Affect (typically observed):  Stable Orientation:  Oriented to Self, Oriented to Place Alcohol / Substance use:  Never Used Psych involvement (Current and /or in the community):  No (Comment)  Discharge Needs  Concerns to be addressed:  Care Coordination, Discharge Planning Concerns Readmission within the last 30 days:  No Current discharge risk:  Chronically ill Barriers to Discharge:  Continued Medical Work up, Inadequate or no insurance   [place], LCSW 12/17/2017, 4:31 PM

## 2017-12-17 NOTE — Care Management Note (Signed)
Case Management Note  Patient Details  Name: Julia Alvarado MRN: [id number] Date of Birth: 15-Mar-1941  Subjective/Objective:     A request for a Hospice assessment by [facility] was made by son Vernestine Brodhead to this Probation officer today. A referral was faxed and e-mailed to Lorelle Formosa at [facility] today.                Action/Plan:   Expected Discharge Date:                  Expected Discharge Plan:     In-House Referral:     Discharge planning Services     Post Acute Care Choice:    Choice offered to:     DME Arranged:    DME Agency:     HH Arranged:    HH Agency:     Status of Service:     If discussed at [employer] of Stay Meetings, dates discussed:    Additional Comments:  Eoghan Belcher A, RN 12/17/2017, 1:21 PM

## 2017-12-17 NOTE — Progress Notes (Signed)
      INFECTIOUS DISEASE ATTENDING ADDENDUM:   Date: 12/17/2017  Patient name: RYKER PHERIGO  Medical record number: [id number]  Date of birth: 27-Nov-1941   KIMISHA EUNICE is a 76 y.o. female admitted on 12/27 for necrotic hip wound found to have leukocytosis of 24K, polymicrobial bacteremia including group b strep, ecoli, and enterococcus. She was taken to OR for debridement yesterday. My understanding is that one ulcer tracks to hardware in her hip raising specter of osteomyelitis  She remains on broad spectrum abx pending sensis on organisms  Based on review of her history and discussion with primary team it would seem more appropriate for this patient to be pursuing palliative care but my understanding is that family are not agreeable to this as of yet.   If family and pt desire aggressive care she will require 6 weeks of targetted IV abx followed potentially by po abx (if org are  S) since there is concern for hardware infection  If hardware can be removed from hip we could try to go for cure with IV abx alone       Alcide Evener 12/17/2017, 11:33 AM

## 2017-12-17 NOTE — Progress Notes (Signed)
Pharmacy Antibiotic Note  Julia Alvarado is a 76 y.o. female admitted on 12/14/2017 with multiple species bacteremia  (E.coli, proteus, streptococcus, enterococcus).  Pharmacy has been consulted for vancomycin and meropenem dosing.  Pharmacy consulted to dose vancomycin and meropenem in this 76 year old female with multiple species bacteremia. Source of infection is necrotic hip wound. ID following.  Using actual SCr, patient's CrCl = 65 mL/min. Will dose antibiotics accordingly.  Plan: Given change in indication from cellulitis to bacteremia and CrCl = 65 mL/min, will increase dose of antibiotics.   Increase meropenem to 1 g IV q8h  Increase vancomycin to 750 mg IV q18h starting now Goal VT 15-20 mcg/mL VT ordered 12/30  Kinetics: Using adjusted body weight = 48 kg, CrCl 65 mL/min Ke: 0.058 Half-life: 12 hrs Vd: 33 L  Cmin (calculated) ~16 mcg/mL    Height: 4' 11.02" (149.9 cm) Weight: 113 lb 15.7 oz (51.7 kg) IBW/kg (Calculated) : 43.24  Temp (24hrs), Avg:98.1 F (36.7 C), Min:98 F (36.7 C), Max:98.1 F (36.7 C)  Recent Labs  Lab 12/14/17 1423 12/14/17 1908 12/14/17 2130 12/15/17 0402 12/16/17 0522 12/17/17 0424 12/17/17 2024  WBC 24.6*  --   --  21.9* 19.2* 13.5*  --   CREATININE 0.72  --   --  0.50 0.51 0.52  --   LATICACIDVEN 1.7 1.6 1.9  --   --   --   --   VANCOTROUGH  --   --   --   --   --   --  8*    Estimated Creatinine Clearance: 41.4 mL/min (by C-G formula based on SCr of 0.52 mg/dL).    Allergies  Allergen Reactions  . Cefdinir Other (See Comments)    Liquid form causes oral thrush  . Codeine Nausea And Vomiting    nausea  . Ibandronic Acid Nausea Only  . Latex Hives  . Nitrofurantoin     Other reaction(s): Vomiting  . Raloxifene Other (See Comments)    Per pt, "eye doctor said I cannot take" Per pt, "eye doctor said I cannot take" Per pt, "eye doctor said I cannot take"   Antimicrobials this admission: vancomycin 12/27 >>  meropenem  12/28 >>  Piperacillin/tazobactam 12/27 > 12/28  Dose adjustments this admission: 12/28 increased meropenem from 1 g IV q12h to 1 g IV q8h 12/28 increased vancomycin from 750 mg IV q24h to 750 mg IV q18h  12/30 PM vanc level 8. Changing to 750 mg q 12 hours. Level before 3rd new dose.  Microbiology results: 12/27 BCx: Proteus, E.coli, streptococcus agalactiae, enterococcus. Sensitivities pending 12/27 Wound: Pending  12/27 MRSA PCR: Negative  Thank you for allowing pharmacy to be a part of this patient's care.  Eloise Harman, PharmD, BCPS Clinical Pharmacist 12/17/2017 9:47 PM

## 2017-12-17 NOTE — Progress Notes (Signed)
[facility] Hospital Day(s): 3.   Post op day(s): 1 Day Post-Op.   Interval History: Patient seen and examined, no acute events or new complaints overnight, though unable to obtain history from patient due to her chronic dementia. Patient's chart was reviewed and patient's care discussed with her family and RN.  Review of Systems: Unable to assess beyond interval history due to patient's baseline dementia  Vital signs in last 24 hours: [min-max] current  Temp:  [97.6 F (36.4 C)-99.4 F (37.4 C)] 98.1 F (36.7 C) (12/30 0653) Pulse Rate:  [68-87] 79 (12/30 0653) Resp:  [16-25] 16 (12/30 0653) BP: (115-142)/(51-65) 115/51 (12/30 0653) SpO2:  [90 %-99 %] 97 % (12/30 0653)     Height: 4' 11.02" (149.9 cm) Weight: 113 lb 15.7 oz (51.7 kg) BMI (Calculated): 23.01   Intake/Output this shift:  No intake/output data recorded.   Intake/Output last 2 shifts:  @IOLAST2SHIFTS @   Physical Exam:  Constitutional: alert, cooperative and no distress  HENT: normocephalic without obvious abnormality  Eyes: PERRL, EOM's grossly intact and symmetric  Neuro: CN II - XII grossly intact and symmetric without deficit  Respiratory: breathing non-labored at rest  Cardiovascular: regular rate and sinus rhythm  Gastrointestinal: soft, non-tender, and non-distended Musculoskeletal: UE and LE FROM, no edema or wounds, motor and sensation grossly intact, NT Integumentary: large sacro-coccygeal and B/L hip pressure necrosis with Silvadene + moist to dry dressings, additional post-debridement necrotic tissues at all sites (worst at Right hip) and readily visibly and palpably exposed sacral-coccygeal bone  Labs:  CBC Latest Ref Rng & Units 12/17/2017 12/16/2017 12/16/2017  WBC 3.6 - 11.0 K/uL 13.5(H) - 19.2(H)  Hemoglobin 12.0 - 16.0 g/dL 7.0(L) 7.5(L) 7.0(L)  Hematocrit 35.0 - 47.0 % 21.4(L) 23.7(L) 22.0(L)  Platelets 150 - 440 K/uL 314 - 341   CMP Latest Ref Rng & Units 12/17/2017  12/16/2017 12/15/2017  Glucose 65 - 99 mg/dL 105(H) 127(H) 137(H)  BUN 6 - 20 mg/dL 8 11 18   Creatinine 0.44 - 1.00 mg/dL 0.52 0.51 0.50  Sodium 135 - 145 mmol/L 137 137 137  Potassium 3.5 - 5.1 mmol/L 3.7 3.6 3.7  Chloride 101 - 111 mmol/L 105 105 109  CO2 22 - 32 mmol/L 28 25 24   Calcium 8.9 - 10.3 mg/dL 7.7(L) 7.7(L) 7.4(L)  Total Protein 6.5 - 8.1 g/dL - - -  Total Bilirubin 0.3 - 1.2 mg/dL - - -  Alkaline Phos 38 - 126 U/L - - -  AST 15 - 41 U/L - - -  ALT 14 - 54 U/L - - -   Imaging studies: No new pertinent imaging studies   Assessment/Plan: (ICD-10's: L89.154, Z61.096, E45.409) 76 y.o. female with multiple large pressure wounds of her sacro-coccygeal region and bilateral hips including sinus tract extending to/towards Left hip 1 Day Post-Op s/p sharp excisional debridement and 9 months s/p Left hip surgery with placement of hardware, complicated by pertinent comorbidities including advanced chronological age with severe dementia, HTN, HLD, hypothyroidism, GERD, osteoarthritis, and a history of breast cancer.   - keep mattress and patient's sheets clean and dry  - pressure offloading (specialty bed, frequent repositioning)   - at least daily moist-to-dry dressing changes with additional debridement as needed   - medical management of patient's comorbidities as per primary medical team  - palliative care consultation to discuss hospice per family's request   All of the above findings and recommendations were discussed with the patient's family and patient's RN, and all of  patient's family's questions were answered to their expressed satisfaction.  Thank you for the opportunity to participate in this patient's care.  -- Marilynne Drivers Rosana Hoes, MD, [place]: [facility] - Partnering for exceptional care. Office: [phone]

## 2017-12-17 NOTE — NC FL2 (Signed)
[place] LEVEL OF CARE SCREENING TOOL     IDENTIFICATION  Patient Name: Julia Alvarado Birthdate: 02-22-1941 Sex: female Admission Date (Current Location): 12/14/2017  [place] and [place] Number:  Engineering geologist and Address:  [facility], [address] [place], [place] [address]      Provider Number: [id number]  Attending Physician Name and Address:  Epifanio Lesches, MD  Relative Name and Phone Number:  Ameshia Pewitt ([phone]) or Harold Barban [phone])    Current Level of Care: Hospital Recommended Level of Care: [place] Prior Approval Number:    Date Approved/Denied:   PASRR Number: [id number]  Discharge Plan: SNF    Current Diagnoses: Patient Active Problem List   Diagnosis Date Noted  . Pressure injury of skin 12/16/2017  . Sacral wound, initial encounter   . Sepsis ([place]) 12/14/2017  . Closed intertrochanteric fracture of hip, left, initial encounter ([place]) 03/03/2017  . UTI (urinary tract infection) 02/03/2017  . 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 GERD (gastroesophageal reflux disease)   . Hypothyroidism   . Sacral fracture ([place])   . Lewy body dementia without behavioral disturbance   . Breast cancer ([place])     Orientation RESPIRATION BLADDER Height & Weight     Self, Situation, Place  O2 Incontinent Weight: 113 lb 15.7 oz (51.7 kg) Height:  4' 11.02" (149.9 cm)  BEHAVIORAL SYMPTOMS/MOOD NEUROLOGICAL BOWEL NUTRITION STATUS      Incontinent Diet(Dysphagia 1, thin liquids)  AMBULATORY STATUS COMMUNICATION OF NEEDS Skin   Total Care Verbally Normal                       Personal Care Assistance Level of Assistance  Bathing, Feeding, Dressing Bathing Assistance: Maximum assistance Feeding assistance: Limited assistance Dressing Assistance: Maximum assistance     Functional Limitations Info             SPECIAL CARE FACTORS FREQUENCY                        Contractures Contractures Info: Not present    Additional Factors Info  Code Status, Allergies, Psychotropic Code Status Info: DNR Allergies Info: Cefdinir, Codeine, Ibandronic Acid, Latex, Nitrofurantoin, Raloxifene Psychotropic Info: Zoloft         Current Medications (12/17/2017):  This is the current hospital active medication list Current Facility-Administered Medications  Medication Dose Route Frequency Provider Last Rate Last Dose  . 0.9 %  sodium chloride infusion   Intravenous Once Epifanio Lesches, MD      . 0.9 %  sodium chloride infusion   Intravenous Once Epifanio Lesches, MD      . acetaminophen (TYLENOL) tablet 650 mg  650 mg Oral Q6H PRN Salary, Montell D, MD       Or  . acetaminophen (TYLENOL) suppository 650 mg  650 mg Rectal Q6H PRN Salary, Montell D, MD   650 mg at 12/15/17 1430  . acidophilus (RISAQUAD) capsule 2 capsule  2 capsule Oral Daily Salary, Montell D, MD   2 capsule at 12/17/17 1240  . dextrose 5 % in lactated ringers infusion   Intravenous Continuous Epifanio Lesches, MD 50 mL/hr at 12/17/17 1320    . docusate sodium (COLACE) capsule 100 mg  100 mg Oral BID Salary, Montell D, MD   100 mg at 12/17/17 1240  . fluticasone (FLONASE) 50 MCG/ACT nasal spray 1 spray  1 spray Each Nare Daily Salary, Avel Peace, MD  1 spray at 12/15/17 0929  . heparin injection 5,000 Units  5,000 Units Subcutaneous Q8H Salary, Holly Bodily D, MD   5,000 Units at 12/17/17 [phone]  . HYDROcodone-acetaminophen (NORCO/VICODIN) 5-325 MG per tablet 1-2 tablet  1-2 tablet Oral Q4H PRN Salary, Avel Peace, MD   1 tablet at 12/17/17 1224  . latanoprost (XALATAN) 0.005 % ophthalmic solution 1 drop  1 drop Both Eyes QHS Salary, Montell D, MD   1 drop at 12/17/17 0007  . levothyroxine (SYNTHROID, LEVOTHROID) tablet 75 mcg  75 mcg Oral QAC breakfast Loney Hering D, MD   75 mcg at 12/17/17 0817  . Melatonin TABS 2.5-5 mg  0.5-1 tablet Oral QHS Salary, Montell D, MD   5 mg at 12/17/17  0005  . meloxicam (MOBIC) tablet 7.5 mg  7.5 mg Oral Daily Salary, Montell D, MD   7.5 mg at 12/17/17 1246  . memantine (NAMENDA) tablet 10 mg  10 mg Oral BID Loney Hering D, MD   10 mg at 12/17/17 1241  . meropenem (MERREM) 1 g in sodium chloride 0.9 % 100 mL IVPB  1 g Intravenous Q8H Lenis Noon, RPH 200 mL/hr at 12/17/17 0547 1 g at 12/17/17 0547  . multivitamin-lutein (OCUVITE-LUTEIN) capsule 1 capsule  1 capsule Oral Daily Epifanio Lesches, MD   1 capsule at 12/17/17 1246  . ondansetron (ZOFRAN) tablet 4 mg  4 mg Oral Q6H PRN Salary, Montell D, MD       Or  . ondansetron (ZOFRAN) injection 4 mg  4 mg Intravenous Q6H PRN Salary, Montell D, MD      . pneumococcal 23 valent vaccine (PNU-IMMUNE) injection 0.5 mL  0.5 mL Intramuscular Tomorrow-1000 Salary, Montell D, MD      . polyethylene glycol (MIRALAX / GLYCOLAX) packet 17 g  17 g Oral Daily PRN Salary, Montell D, MD      . sertraline (ZOLOFT) tablet 25 mg  25 mg Oral BID Salary, Montell D, MD   25 mg at 12/17/17 1246  . sodium chloride (OCEAN) 0.65 % nasal spray 1 spray  1 spray Each Nare 5 X Daily Salary, Montell D, MD   1 spray at 12/17/17 0547  . traZODone (DESYREL) tablet 100 mg  100 mg Oral QHS Salary, Montell D, MD   100 mg at 12/17/17 0008  . Valproate Sodium (DEPAKENE) solution 500 mg  500 mg Oral BID Salary, Holly Bodily D, MD   500 mg at 12/17/17 1247   And  . Valproate Sodium (DEPAKENE) solution 250 mg  250 mg Oral Q2000 Salary, Montell D, MD   250 mg at 12/17/17 0005  . vancomycin (VANCOCIN) IVPB 750 mg/150 ml premix  750 mg Intravenous Q18H Lenis Noon, [facility]   Stopped at 12/17/17 0145  . vitamin B-12 (CYANOCOBALAMIN) tablet 1,000 mcg  1,000 mcg Oral Daily Salary, Montell D, MD   1,000 mcg at 12/17/17 1247  . vitamin C (ASCORBIC ACID) tablet 500 mg  500 mg Oral BID Salary, Holly Bodily D, MD   500 mg at 12/17/17 1247     Discharge Medications: Please see discharge summary for a list of discharge medications.  Relevant  Imaging Results:  Relevant Lab Results:   Additional Information SS# [id number]  Zettie Pho, LCSW

## 2017-12-18 DIAGNOSIS — A408 Other streptococcal sepsis: Secondary | ICD-10-CM

## 2017-12-18 DIAGNOSIS — F0280 Dementia in other diseases classified elsewhere without behavioral disturbance: Secondary | ICD-10-CM

## 2017-12-18 DIAGNOSIS — Z7189 Other specified counseling: Secondary | ICD-10-CM

## 2017-12-18 DIAGNOSIS — G3183 Dementia with Lewy bodies: Secondary | ICD-10-CM

## 2017-12-18 DIAGNOSIS — Z515 Encounter for palliative care: Secondary | ICD-10-CM

## 2017-12-18 LAB — BASIC METABOLIC PANEL
ANION GAP: 4 — AB (ref 5–15)
BUN: 9 mg/dL (ref 6–20)
CHLORIDE: 103 mmol/L (ref 101–111)
CO2: 31 mmol/L (ref 22–32)
Calcium: 7.7 mg/dL — ABNORMAL LOW (ref 8.9–10.3)
Creatinine, Ser: 0.44 mg/dL (ref 0.44–1.00)
GFR calc Af Amer: >60 mL/min (ref >60)
GFR calc non Af Amer: >60 mL/min (ref >60)
Glucose, Bld: 109 mg/dL — ABNORMAL HIGH (ref 65–99)
POTASSIUM: 3.8 mmol/L (ref 3.5–5.1)
SODIUM: 138 mmol/L (ref 135–145)

## 2017-12-18 LAB — AEROBIC CULTURE W GRAM STAIN (SUPERFICIAL SPECIMEN)

## 2017-12-18 LAB — CULTURE, BLOOD (ROUTINE X 2)

## 2017-12-18 LAB — CBC
HCT: 21.9 % — ABNORMAL LOW (ref 35.0–47.0)
HEMOGLOBIN: 7.1 g/dL — AB (ref 12.0–16.0)
MCH: 32.7 pg (ref 26.0–34.0)
MCHC: 32.2 g/dL (ref 32.0–36.0)
MCV: 101.3 fL — AB (ref 80.0–100.0)
Platelets: 355 10*3/uL (ref 150–440)
RBC: 2.17 MIL/uL — AB (ref 3.80–5.20)
RDW: 15.6 % — ABNORMAL HIGH (ref 11.5–14.5)
WBC: 10.1 10*3/uL (ref 3.6–11.0)

## 2017-12-18 LAB — AEROBIC CULTURE  (SUPERFICIAL SPECIMEN): GRAM STAIN: NONE SEEN

## 2017-12-18 MED ORDER — GLYCOPYRROLATE 1 MG PO TABS: 1.0000 mg | ORAL_TABLET | ORAL | 0 refills | Status: AC | PRN

## 2017-12-18 MED ORDER — GLYCOPYRROLATE 0.2 MG/ML IJ SOLN
0.2000 mg | INTRAMUSCULAR | Status: DC | PRN
  Filled 2017-12-18: qty 1

## 2017-12-18 MED ORDER — HALOPERIDOL LACTATE 5 MG/ML IJ SOLN
0.5000 mg | INTRAMUSCULAR | Status: DC | PRN
  Filled 2017-12-18: qty 0.1

## 2017-12-18 MED ORDER — HALOPERIDOL 0.5 MG PO TABS
0.5000 mg | ORAL_TABLET | ORAL | Status: DC | PRN
Start: 2017-12-18 — End: 2017-12-18
  Filled 2017-12-18: qty 1

## 2017-12-18 MED ORDER — POLYVINYL ALCOHOL 1.4 % OP SOLN
1.0000 [drp] | Freq: Four times a day (QID) | OPHTHALMIC | Status: DC | PRN
  Filled 2017-12-18: qty 15

## 2017-12-18 MED ORDER — HALOPERIDOL LACTATE 2 MG/ML PO CONC
0.5000 mg | ORAL | Status: DC | PRN
  Filled 2017-12-18: qty 0.3

## 2017-12-18 MED ORDER — MORPHINE SULFATE (CONCENTRATE) 10 MG/0.5ML PO SOLN: 5.0000 mg | ORAL | 0 refills | Status: AC | PRN

## 2017-12-18 MED ORDER — HYDROMORPHONE HCL 1 MG/ML IJ SOLN: 0.5000 mg | INTRAMUSCULAR | Status: DC | PRN

## 2017-12-18 MED ORDER — MORPHINE SULFATE (CONCENTRATE) 10 MG/0.5ML PO SOLN: 5.0000 mg | ORAL | Status: DC | PRN

## 2017-12-18 MED ORDER — GLYCOPYRROLATE 1 MG PO TABS
1.0000 mg | ORAL_TABLET | ORAL | Status: DC | PRN
  Filled 2017-12-18: qty 1

## 2017-12-18 MED ORDER — MORPHINE SULFATE (CONCENTRATE) 10 MG/0.5ML PO SOLN
5.0000 mg | ORAL | Status: DC | PRN
  Filled 2017-12-18: qty 1

## 2017-12-18 MED ORDER — BIOTENE DRY MOUTH MT LIQD: 15.0000 mL | OROMUCOSAL | Status: DC | PRN

## 2017-12-18 MED ORDER — LORAZEPAM 2 MG/ML PO CONC: 1.0000 mg | ORAL | 0 refills | Status: AC | PRN

## 2017-12-18 MED ORDER — MORPHINE SULFATE (CONCENTRATE) 10 MG/0.5ML PO SOLN
5.0000 mg | ORAL | Status: DC | PRN
Start: 2017-12-18 — End: 2017-12-18

## 2017-12-18 MED ORDER — LORAZEPAM 2 MG/ML PO CONC
1.0000 mg | ORAL | Status: DC | PRN
  Filled 2017-12-18: qty 0.5

## 2017-12-18 MED ORDER — LORAZEPAM 1 MG PO TABS: 1.0000 mg | ORAL_TABLET | ORAL | Status: DC | PRN

## 2017-12-18 MED ORDER — MORPHINE SULFATE (CONCENTRATE) 10 MG/0.5ML PO SOLN
5.0000 mg | ORAL | Status: DC | PRN
  Administered 2017-12-18: 5 mg via ORAL

## 2017-12-18 MED ORDER — HALOPERIDOL LACTATE 2 MG/ML PO CONC: 0.5000 mg | ORAL | 0 refills | Status: AC | PRN

## 2017-12-18 MED ORDER — MORPHINE SULFATE (CONCENTRATE) 10 MG/0.5ML PO SOLN
10.0000 mg | Freq: Three times a day (TID) | ORAL | Status: DC
  Administered 2017-12-18: 10 mg via SUBLINGUAL
  Filled 2017-12-18: qty 1

## 2017-12-18 MED ORDER — LORAZEPAM 2 MG/ML IJ SOLN: 1.0000 mg | INTRAMUSCULAR | Status: DC | PRN

## 2017-12-18 MED ORDER — GLYCOPYRROLATE 0.2 MG/ML IJ SOLN: 0.2000 mg | INTRAMUSCULAR | 3 refills | Status: AC | PRN

## 2017-12-18 NOTE — Consult Note (Signed)
Consultation Note Date: 12/18/2017   Patient Name: Julia Alvarado  DOB: [birth year]  MRN: [id number]  Age / Sex: 76 y.o., female  PCP: Valera Castle, MD Referring Physician: Epifanio Lesches, MD  Reason for Consultation: Establishing goals of care  HPI/Patient Profile: 76 y.o. female  with past medical history of lewy body dementia, breast cancer, arthritis, sacral fracture, thyroid disease, chronic hip and sacral wounds who was admitted on 12/14/2017 with fever and altered mental status.  She was found to have an infected left hip wound.  She went to the OR on 12/29 for debridment.  Since she has been found to have multi-organism bacteremia (E.coli, proteus, streptococcus, enterococcus).  The family has learned from surgery that the patient's wounds will not heal.   Clinical Assessment and Goals of Care:  I have reviewed medical records including EPIC notes, labs and imaging, received report from the care team, assessed the patient and then met at the bedside along with her son and daughter  to discuss diagnosis prognosis, GOC, EOL wishes, disposition and options.  I introduced Palliative Medicine as specialized medical care for people living with serious illness. It focuses on providing relief from the symptoms and stress of a serious illness. The goal is to improve quality of life for both the patient and the family.  We discussed a brief life review of the patient. She had two children.  Lost her husband approximately 7 years ago.  She worked in a Research officer, trade [employer].  She is a Engineer, manufacturing.  She fell in 2012 and her son feels that is when her dementia really started.  As far as functional and nutritional status she still eats ice cream and small amounts, but she is unable to speak more than a word or two.  She is unable to lift her head.  She has been bed bound since her pelvic fracture in March.   Family feels she is at end of life.  The difference between aggressive medical intervention and comfort care was considered in light of the families goals of care.  Hospice and Palliative Care services outpatient were explained and offered.  The family feels [place] would be the best place for their mother at this point.  They are familiar with [facility] as they have had other family members there.  Questions and concerns were addressed.   The family was encouraged to call with questions or concerns.     Primary Decision Maker:  NEXT OF KIN Son and Dtr.    SUMMARY OF RECOMMENDATIONS     Full comfort.  All interventions not related to comfort have been discontinued.    Comfort medications (both PRN and scheduled) have been ordered.  DC to hospice house as soon as bed is available.  Code Status/Advance Care Planning:  DNR   Palliative Prophylaxis:   Aspiration, Delirium Protocol and Frequent Pain Assessment  Psycho-social/Spiritual:   Desire for further Chaplaincy support: yes  Prognosis:  Days to weeks given over-whelming infection and very advanced lewy  body dementia.    Discharge Planning: Hospice facility      Primary Diagnoses: Present on Admission: . Sepsis ([place])   I have reviewed the medical record, interviewed the patient and family, and examined the patient. The following aspects are pertinent.  Past Medical History:  Diagnosis Date  . Arthritis   . Breast cancer ([place])   . Breast cancer ([place])   . GERD (gastroesophageal reflux disease)   . Hyperlipemia   . Hypertension   . Hypothyroidism   . Lewy body dementia without behavioral disturbance   . Sacral fracture ([place]) 1/15   Social History   Socioeconomic History  . Marital status: Widowed    Spouse name: None  . Number of children: 2  . Years of education: None  . Highest education level: None  Social Needs  . Financial resource strain: None  . Food insecurity -  worry: None  . Food insecurity - inability: None  . Transportation needs - medical: None  . Transportation needs - non-medical: None  Occupational History  . Occupation: Special educational needs teacher    Comment: Retired  Tobacco Use  . Smoking status: Never Smoker  . Smokeless tobacco: Never Used  Substance and Sexual Activity  . Alcohol use: No    Alcohol/week: 0.0 oz  . Drug use: No  . Sexual activity: No  Other Topics Concern  . None  Social History Narrative   Widowed 02/28/09   4 children--- 2 deceased      Has living will   Daughter and son have health care POA   Would accept resuscitation attempts but no prolonged ventilation   No feeding tube   Family History  Problem Relation Age of Onset  . Heart disease Father   . Cancer Brother        all except 1 died of cancer   Scheduled Meds: . docusate sodium  100 mg Oral BID  . fluticasone  1 spray Each Nare Daily  . latanoprost  1 drop Both Eyes QHS  . levothyroxine  75 mcg Oral QAC breakfast  . morphine CONCENTRATE  10 mg Sublingual Q8H  . pneumococcal 23 valent vaccine  0.5 mL Intramuscular Tomorrow-1000  . sertraline  25 mg Oral BID  . silver sulfADIAZINE   Topical Daily  . sodium chloride  1 spray Each Nare 5 X Daily  . traZODone  100 mg Oral QHS  . Valproate Sodium  500 mg Oral BID   And  . Valproate Sodium  250 mg Oral Q2000   Continuous Infusions: . sodium chloride    . sodium chloride     PRN Meds:.acetaminophen **OR** acetaminophen, antiseptic oral rinse, glycopyrrolate **OR** glycopyrrolate **OR** glycopyrrolate, haloperidol **OR** haloperidol **OR** haloperidol lactate, HYDROcodone-acetaminophen, HYDROmorphone (DILAUDID) injection, LORazepam **OR** LORazepam **OR** LORazepam, morphine CONCENTRATE **OR** morphine CONCENTRATE, ondansetron **OR** ondansetron (ZOFRAN) IV, polyethylene glycol, polyvinyl alcohol Allergies  Allergen Reactions  . Cefdinir Other (See Comments)    Liquid form causes oral thrush  . Codeine  Nausea And Vomiting    nausea  . Ibandronic Acid Nausea Only  . Latex Hives  . Nitrofurantoin     Other reaction(s): Vomiting  . Raloxifene Other (See Comments)    Per pt, "eye doctor said I cannot take" Per pt, "eye doctor said I cannot take" Per pt, "eye doctor said I cannot take"   Review of Systems 10 sys  Physical Exam 2 of 9  Vital Signs: BP (!) 131/50   Pulse 68   Temp 98 F (  36.7 C) (Oral)   Resp 16   Ht 4' 11.02" (1.499 m)   Wt 51.7 kg (113 lb 15.7 oz)   SpO2 98%   BMI 23.01 kg/m  Pain Assessment: Faces POSS *See Group Information*: 1-Acceptable,Awake and alert Pain Score: 2    SpO2: SpO2: 98 % O2 Device:SpO2: 98 % O2 Flow Rate: .O2 Flow Rate (L/min): 2 L/min  IO: Intake/output summary:   Intake/Output Summary (Last 24 hours) at 12/18/2017 1109 Last data filed at 12/18/2017 0115 Gross per 24 hour  Intake 1178.75 ml  Output -  Net 1178.75 ml    LBM: Last BM Date: 12/18/17 Baseline Weight: Weight: 51.7 kg (113 lb 15.7 oz) Most recent weight: Weight: 51.7 kg (113 lb 15.7 oz)     Palliative Assessment/Data: 20%     Time In: 10:00 Time Out: 10:50 Time Total: 50 min. Greater than 50%  of this time was spent counseling and coordinating care related to the above assessment and plan.  Signed by: Florentina Jenny, PA-C Palliative Medicine Pager: [phone]  Please contact Palliative Medicine Team phone at [phone] for questions and concerns.  For individual provider: See Shea Evans

## 2017-12-18 NOTE — Clinical Social Work Note (Signed)
CSW has also filed a Conservation officer, historic buildings with the [employer] and spoke with York Cerise, who was a lady doing their intake this afternoon. Shela Leff MSW,LCSW [phone]
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
## 2017-12-18 NOTE — Progress Notes (Signed)
Pt discharged to [facility]. IV left in for continuation of pain management. Pt transferring via EMS.

## 2017-12-18 NOTE — Clinical Social Work Note (Signed)
Patient going to go to the hospice home today. Santiago Glad with Hospice is making these arrangements.  Shela Leff MSW,LCSW [phone]

## 2017-12-18 NOTE — Discharge Summary (Signed)
Julia Alvarado, is a 76 y.o. female  DOB Apr 06, 1941  MRN [id number].  Admission date:  12/14/2017  Admitting Physician  Gorden Harms, MD  Discharge Date:  12/18/2017   Primary MD  Valera Castle, MD  Recommendations for primary care physician for things to follow:  Being discharged  To hospice home   Admission Diagnosis  Recurrent UTI [N39.0] Sacral wound, initial encounter [S31.000A] Leukocytosis, unspecified type [D72.829]   Discharge Diagnosis  Recurrent UTI [N39.0] Sacral wound, initial encounter [S31.000A] Leukocytosis, unspecified type [D72.829]    Active Problems:   Sepsis ([place])   Sacral wound, initial encounter   Pressure injury of skin   Palliative care encounter   Comfort measures only status   Encounter for hospice care discussion      Past Medical History:  Diagnosis Date  . Arthritis   . Breast cancer ([place])   . Breast cancer ([place])   . GERD (gastroesophageal reflux disease)   . Hyperlipemia   . Hypertension   . Hypothyroidism   . Lewy body dementia without behavioral disturbance   . Sacral fracture ([place]) 1/15    Past Surgical History:  Procedure Laterality Date  . ABDOMINAL HYSTERECTOMY    . CATARACT EXTRACTION W/ INTRAOCULAR LENS IMPLANT Right   . CATARACT EXTRACTION W/PHACO Right 06/15/2016   Procedure: CATARACT EXTRACTION PHACO AND INTRAOCULAR LENS PLACEMENT (IOC);  Surgeon: Leandrew Koyanagi, MD;  Location: [place];  Service: Ophthalmology;  Laterality: Right;  . FEMUR IM NAIL Left 03/04/2017   Procedure: INTRAMEDULLARY (IM) NAIL FEMORAL;  Surgeon: Hessie Knows, MD;  Location: [facility];  Service: Orthopedics;  Laterality: Left;  Long Affixus, needs C-Arm  . INCISION AND DRAINAGE ABSCESS Bilateral 12/16/2017   Procedure: debridement multiple decubitus ulcers;   Surgeon: Florene Glen, MD;  Location: [facility];  Service: General;  Laterality: Bilateral;  . MASTECTOMY Right ~2005  . MASTECTOMY    . RETINAL DETACHMENT REPAIR W/ SCLERAL BUCKLE LE         History of present illness and  Hospital Course:     Kindly see H&P for history of present illness and admission details, please review complete Labs, Consult reports and Test reports for all details in brief  HPI  from the history and physical done on the day of admission 76 year old female patient comes from nursing home with fever, found to have elevated white count up to 24,000, Foley drainage coming from left hip wound, necrotic wounds in the right hip and also sacrum.  Patient was treated with 10-day course of Keflex prior to Christmas for infected wound.  Admitted to hospital for sepsis secondary to infected decubiti.   Hospital Course  #1 sepsis present on admission secondary to infected decubiti:.  Patient received vancomycin, Levaquin, surgical consult, ID consult obtained.  Patient blood culture showed multiple bacteria including streptococci, E. coli, enterococci.  ID recommended echocardiogram, surgical evaluation.  Echocardiogram did not show any vegetations, surgery Dr. Burt Knack saw the patient, patient is taken to the OR for debridement patient had 4 x 4 centimeter wound in the right hip area which is debrided, 11i nto 7 cm sacral wound which is unroofed for the, patient found to have dead tissue with pus below that.  Patient also found to have left hip wound with purulent drainage, found to have sinus open and tracking down to the left femur and greater trochanter.  And also has large cavity over the wound.  And patient cavity is around 5 into  4 cm.  Patient seen by surgery Dr. Rosana Hoes, clean the wound at bedside the following day.  Discussed the prognosis with patient's family, they wanted to talk to palliative care, seen by palliative care MD, decision is made To transfer to hospice home as  soon as possible.  Patient is on full comfort measures, she is going to hospice home today.  CODE STATUS DNR.   Discharge Condition:    Follow UP      Discharge Instructions  and  Discharge Medications      Allergies as of 12/18/2017      Reactions   Cefdinir Other (See Comments)   Liquid form causes oral thrush   Codeine Nausea And Vomiting   nausea   Ibandronic Acid Nausea Only   Latex Hives   Nitrofurantoin    Other reaction(s): Vomiting   Raloxifene Other (See Comments)   Per pt, "eye doctor said I cannot take" Per pt, "eye doctor said I cannot take" Per pt, "eye doctor said I cannot take"      Medication List    STOP taking these medications   acetaminophen 325 MG tablet Commonly known as:  TYLENOL   Cranberry 450 MG Caps   docusate sodium 100 MG capsule Commonly known as:  COLACE   enoxaparin 40 MG/0.4ML injection Commonly known as:  LOVENOX   fluticasone 50 MCG/ACT nasal spray Commonly known as:  FLONASE   GLUCOSAMINE-CHONDROITIN DS 500-400 MG tablet Generic drug:  glucosamine-chondroitin   latanoprost 0.005 % ophthalmic solution Commonly known as:  XALATAN   levothyroxine 112 MCG tablet Commonly known as:  SYNTHROID, LEVOTHROID   Melatonin 3 MG Caps   meloxicam 7.5 MG tablet Commonly known as:  MOBIC   memantine 10 MG tablet Commonly known as:  NAMENDA   sertraline 25 MG tablet Commonly known as:  ZOLOFT   sodium chloride 0.65 % Soln nasal spray Commonly known as:  OCEAN   traZODone 50 MG tablet Commonly known as:  DESYREL   vitamin B-12 1000 MCG tablet Commonly known as:  CYANOCOBALAMIN   vitamin C 500 MG tablet Commonly known as:  ASCORBIC ACID     TAKE these medications   glycopyrrolate 0.2 MG/ML injection Commonly known as:  ROBINUL Inject 1 mL (0.2 mg total) into the skin every 4 (four) hours as needed (excessive secretions).   glycopyrrolate 1 MG tablet Commonly known as:  ROBINUL Take 1 tablet (1 mg total) by  mouth every 4 (four) hours as needed (excessive secretions).   haloperidol 2 MG/ML solution Commonly known as:  HALDOL Place 0.3 mLs (0.6 mg total) under the tongue every 4 (four) hours as needed for agitation (or delirium).   LORazepam 2 MG/ML concentrated solution Commonly known as:  ATIVAN Place 0.5 mLs (1 mg total) under the tongue every 4 (four) hours as needed for anxiety.   morphine CONCENTRATE 10 MG/0.5ML Soln concentrated solution Place 0.25 mLs (5 mg total) under the tongue every 2 (two) hours as needed for moderate pain (or dyspnea).   Valproate Sodium 250 MG/5ML Soln solution Commonly known as:  DEPAKENE Take 5-10 mLs by mouth 3 (three) times daily. Take 10 ML in the morning, 10 ML at 2 pm and 5 ML bedtime.         Diet and Activity recommendation: See Discharge Instructions above   Consults obtained -surgery, palliative care,ID   Major procedures and Radiology Reports - PLEASE review detailed and final reports for all details, in brief -  Dg Chest Port 1 View  Result Date: 12/14/2017 CLINICAL DATA:  Elevated white count EXAM: PORTABLE CHEST 1 VIEW COMPARISON:  03/03/2017 FINDINGS: Minimal atelectasis or scar at the left base. No pleural effusion. Borderline cardiomegaly. No pneumothorax. Pleural and parenchymal scarring at the apices. IMPRESSION: 1. Borderline to mild cardiomegaly without edema 2. Streaky atelectasis or scar at the left base. No definite acute pulmonary infiltrate. Electronically Signed   By: Donavan Foil M.D.   On: 12/14/2017 15:29    Micro Results    Recent Results (from the past 240 hour(s))  Culture, blood (routine x 2)     Status: Abnormal (Preliminary result)   Collection Time: 12/14/17  2:27 PM  Result Value Ref Range Status   Specimen Description   Final    BLOOD BLOOD RIGHT FOREARM Performed at [facility], [place] [address] [place], [place] [address]    Special Requests   Final    BOTTLES DRAWN AEROBIC AND ANAEROBIC  Blood Culture adequate volume Performed at [facility], [address] [place], [place] [address]    Culture  Setup Time   Final    GRAM POSITIVE COCCI GRAM NEGATIVE RODS IN BOTH AEROBIC AND ANAEROBIC BOTTLES CRITICAL RESULT CALLED TO, READ BACK BY AND VERIFIED WITH: MATT MCBANE AT [place] ON 12/15/17 [place]. GRAM STAIN REVIEWED-AGREE WITH RESULT Performed at [facility], [place] [address], [place],  [address]    Culture (A)  Final    GROUP B STREP(S.AGALACTIAE)ISOLATED ESCHERICHIA COLI Confirmed Extended Spectrum Beta-Lactamase Producer (ESBL).  In bloodstream infections from ESBL organisms, carbapenems are preferred over piperacillin/tazobactam. They are shown to have a lower risk of mortality. GRAM POSITIVE COCCI PROTEUS MIRABILIS    Report Status PENDING  Incomplete   Organism ID, Bacteria GROUP B STREP(S.AGALACTIAE)ISOLATED  Final   Organism ID, Bacteria ESCHERICHIA COLI  Final      Susceptibility   Escherichia coli - MIC*    AMPICILLIN >=32 RESISTANT Resistant     CEFAZOLIN >=64 RESISTANT Resistant     CEFEPIME RESISTANT Resistant     CEFTAZIDIME RESISTANT Resistant     CEFTRIAXONE RESISTANT Resistant     CIPROFLOXACIN >=4 RESISTANT Resistant     GENTAMICIN <=1 SENSITIVE Sensitive     IMIPENEM <=0.25 SENSITIVE Sensitive     TRIMETH/SULFA >=320 RESISTANT Resistant     AMPICILLIN/SULBACTAM >=32 RESISTANT Resistant     PIP/TAZO 16 SENSITIVE Sensitive     Extended ESBL POSITIVE Resistant     * ESCHERICHIA COLI   Group b strep(s.agalactiae)isolated - MIC*    CLINDAMYCIN >=1 RESISTANT Resistant     AMPICILLIN <=0.25 SENSITIVE Sensitive     ERYTHROMYCIN >=8 RESISTANT Resistant     VANCOMYCIN 0.5 SENSITIVE Sensitive     CEFTRIAXONE <=0.12 SENSITIVE Sensitive     LEVOFLOXACIN 1 SENSITIVE Sensitive     PENICILLIN Value in next row Sensitive      SENSITIVE<=0.06    * GROUP B STREP(S.AGALACTIAE)ISOLATED  Blood Culture ID Panel (Reflexed)     Status: Abnormal    Collection Time: 12/14/17  2:27 PM  Result Value Ref Range Status   Enterococcus species DETECTED (A) NOT DETECTED Final    Comment: CRITICAL RESULT CALLED TO, READ BACK BY AND VERIFIED WITH: MATT MCBANE AT 0548 ON 12/15/17 [place].    Vancomycin resistance NOT DETECTED NOT DETECTED Final   Listeria monocytogenes NOT DETECTED NOT DETECTED Final   Staphylococcus species NOT DETECTED NOT DETECTED Final   Staphylococcus aureus NOT DETECTED NOT  DETECTED Final   Streptococcus species DETECTED (A) NOT DETECTED Final    Comment: CRITICAL RESULT CALLED TO, READ BACK BY AND VERIFIED WITH: MATT MCBANE AT 0548 ON 12/15/17 [place].    Streptococcus agalactiae DETECTED (A) NOT DETECTED Final    Comment: CRITICAL RESULT CALLED TO, READ BACK BY AND VERIFIED WITH: MATT MCBANE AT 0548 ON 12/15/17 [place].    Streptococcus pneumoniae NOT DETECTED NOT DETECTED Final   Streptococcus pyogenes NOT DETECTED NOT DETECTED Final   Acinetobacter baumannii NOT DETECTED NOT DETECTED Final   Enterobacteriaceae species DETECTED (A) NOT DETECTED Final    Comment: CRITICAL RESULT CALLED TO, READ BACK BY AND VERIFIED WITH: MATT MCBANE AT 0548 ON 12/15/17 [place].    Enterobacter cloacae complex NOT DETECTED NOT DETECTED Final   Escherichia coli DETECTED (A) NOT DETECTED Final    Comment: CRITICAL RESULT CALLED TO, READ BACK BY AND VERIFIED WITH: MATT MCBANE AT 0548 ON 12/15/17 [place].    Klebsiella oxytoca NOT DETECTED NOT DETECTED Final   Klebsiella pneumoniae NOT DETECTED NOT DETECTED Final   Proteus species DETECTED (A) NOT DETECTED Final    Comment: CRITICAL RESULT CALLED TO, READ BACK BY AND VERIFIED WITH: MATT MCBANE AT 0548 ON 12/15/17 [place].    Serratia marcescens NOT DETECTED NOT DETECTED Final   Carbapenem resistance NOT DETECTED NOT DETECTED Final   Haemophilus influenzae NOT DETECTED NOT DETECTED Final   Neisseria meningitidis NOT DETECTED NOT DETECTED Final   Pseudomonas aeruginosa NOT DETECTED NOT DETECTED Final    Candida albicans NOT DETECTED NOT DETECTED Final   Candida glabrata NOT DETECTED NOT DETECTED Final   Candida krusei NOT DETECTED NOT DETECTED Final   Candida parapsilosis NOT DETECTED NOT DETECTED Final   Candida tropicalis NOT DETECTED NOT DETECTED Final    Comment: Performed at [facility], [address] [place], [place] [address]  Culture, blood (routine x 2)     Status: Abnormal   Collection Time: 12/14/17  3:29 PM  Result Value Ref Range Status   Specimen Description   Final    BLOOD RIGHT ANTECUBITAL Performed at [facility], [address] [place], [place] [address]    Special Requests   Final    BOTTLES DRAWN AEROBIC AND ANAEROBIC Blood Culture results may not be optimal due to an inadequate volume of blood received in culture bottles Performed at [facility], [address] [place], [place] [address]    Culture  Setup Time   Final    GRAM NEGATIVE RODS ANAEROBIC BOTTLE ONLY CRITICAL RESULT CALLED TO, READ BACK BY AND VERIFIED WITH: NATE COOKSON 12/16/17 @ 1441  MLK    Culture (A)  Final    ESCHERICHIA COLI SUSCEPTIBILITIES PERFORMED ON PREVIOUS CULTURE WITHIN THE LAST 5 DAYS. Performed at [facility], [place] [address] [place], [place] [address]    Report Status 12/18/2017 FINAL  Final  Aerobic Culture (superficial specimen)     Status: None   Collection Time: 12/14/17  4:07 PM  Result Value Ref Range Status   Specimen Description   Final    HIP LEFT Performed at [facility], [address] [place], [place] [address]    Special Requests   Final    NONE Performed at [facility], [address] [place],  [address]    Gram Stain   Final    MODERATE WBC PRESENT, PREDOMINANTLY PMN FEW GRAM POSITIVE RODS RARE GRAM POSITIVE COCCI IN PAIRS    Culture  Final    MODERATE STAPHYLOCOCCUS AUREUS WITHIN MIXED ORGANISMS Performed at [facility], [place] [address], [place], [place] [address]     Report Status 12/17/2017 FINAL  Final   Organism ID, Bacteria STAPHYLOCOCCUS AUREUS  Final      Susceptibility   Staphylococcus aureus - MIC*    CIPROFLOXACIN <=0.5 SENSITIVE Sensitive     ERYTHROMYCIN >=8 RESISTANT Resistant     GENTAMICIN <=0.5 SENSITIVE Sensitive     OXACILLIN <=0.25 SENSITIVE Sensitive     TETRACYCLINE <=1 SENSITIVE Sensitive     VANCOMYCIN <=0.5 SENSITIVE Sensitive     TRIMETH/SULFA <=10 SENSITIVE Sensitive     CLINDAMYCIN <=0.25 SENSITIVE Sensitive     RIFAMPIN <=0.5 SENSITIVE Sensitive     Inducible Clindamycin NEGATIVE Sensitive     * MODERATE STAPHYLOCOCCUS AUREUS  Aerobic Culture (superficial specimen)     Status: Abnormal   Collection Time: 12/14/17  4:07 PM  Result Value Ref Range Status   Specimen Description   Final    WOUND Performed at [facility], [address] [place], [place] [address]    Special Requests   Final    NONE Performed at [facility], [address] [place], [place] [address]    Gram Stain   Final    FEW WBC PRESENT, PREDOMINANTLY PMN ABUNDANT GRAM NEGATIVE RODS MODERATE GRAM POSITIVE COCCI IN PAIRS RARE GRAM POSITIVE RODS    Culture (A)  Final    MULTIPLE ORGANISMS PRESENT, NONE PREDOMINANT NO STAPHYLOCOCCUS AUREUS ISOLATED NO GROUP A STREP (S.PYOGENES) ISOLATED Performed at [facility], [place] [address] [place], [place] [address]    Report Status 12/17/2017 FINAL  Final  Aerobic Culture (superficial specimen)     Status: Abnormal   Collection Time: 12/14/17  4:07 PM  Result Value Ref Range Status   Specimen Description   Final    WOUND Performed at [facility], [address], [place], [place] [address]    Special Requests   Final    NONE Performed at [facility], [address] [place], [place] [address]    Gram Stain   Final    NO WBC SEEN FEW GRAM POSITIVE COCCI IN PAIRS RARE GRAM NEGATIVE RODS Performed at [facility], [place] [address]  [place], [place] [address]    Culture MULTIPLE ORGANISMS PRESENT, NONE PREDOMINANT (A)  Final   Report Status 12/18/2017 FINAL  Final  MRSA PCR Screening     Status: None   Collection Time: 12/14/17  6:50 PM  Result Value Ref Range Status   MRSA by PCR NEGATIVE NEGATIVE Final    Comment:        The GeneXpert MRSA Assay (FDA approved for NASAL specimens only), is one component of a comprehensive MRSA colonization surveillance program. It is not intended to diagnose MRSA infection nor to guide or monitor treatment for MRSA infections. Performed at [facility], [address], [place], [place] [address]   Urine culture     Status: Abnormal   Collection Time: 12/15/17 11:18 AM  Result Value Ref Range Status   Specimen Description   Final    URINE, RANDOM Performed at [facility], [address], [place], [place] [address]    Special Requests   Final    NONE Performed at [facility], [address] [place], [place] [address]    Culture (A)  Final    <10,000 COLONIES/mL INSIGNIFICANT GROWTH Performed at [facility], [place] [address] [place], [place] [address]    Report Status 12/17/2017 FINAL  Final  Culture, blood (single) w Reflex to ID Panel     Status: None (Preliminary result)   Collection Time: 12/15/17  1:36 PM  Result Value Ref Range Status   Specimen Description BLOOD BLOOD RIGHT HAND  Final   Special Requests   Final    BOTTLES DRAWN AEROBIC AND ANAEROBIC Blood Culture adequate volume   Culture   Final    NO GROWTH 3 DAYS Performed at [facility], [address] [place], [place] [address]    Report Status PENDING  Incomplete  Aerobic/Anaerobic Culture (surgical/deep wound)     Status: Abnormal (Preliminary result)   Collection Time: 12/16/17  2:06 PM  Result Value Ref Range Status   Specimen Description   Final    DECUBITIS ULCER Performed at [facility], [place] [address], [place], [place] [address]     Special Requests   Final    NONE Performed at [facility], [place], [place] [address]    Gram Stain   Final    ABUNDANT WBC PRESENT, PREDOMINANTLY PMN RARE GRAM POSITIVE COCCI Performed at [facility], [place] [address] [place], [place] [address]    Culture (A)  Final    MULTIPLE ORGANISMS PRESENT, NONE PREDOMINANT NO ANAEROBES ISOLATED; CULTURE IN PROGRESS FOR 5 DAYS    Report Status PENDING  Incomplete  Aerobic/Anaerobic Culture (surgical/deep wound)     Status: Abnormal (Preliminary result)   Collection Time: 12/16/17  2:06 PM  Result Value Ref Range Status   Specimen Description   Final    DECUBITIS ULCER Performed at [facility], [place] [address] [place], [place] [address]    Special Requests   Final    NONE Performed at [facility], [place], [place] [address]    Gram Stain   Final    MODERATE WBC PRESENT,BOTH PMN AND MONONUCLEAR ABUNDANT GRAM POSITIVE COCCI ABUNDANT GRAM NEGATIVE RODS    Culture (A)  Final    MULTIPLE ORGANISMS PRESENT, NONE PREDOMINANT HOLDING FOR POSSIBLE ANAEROBE Performed at [facility], [name] [address] [place], [place] [address]    Report Status PENDING  Incomplete  Aerobic/Anaerobic Culture (surgical/deep wound)     Status: None (Preliminary result)   Collection Time: 12/16/17  2:06 PM  Result Value Ref Range Status   Specimen Description   Final    DECUBITIS ULCER Performed at [facility], [place] [address], [place], [place] [address]    Special Requests   Final    NONE Performed at [facility], [place], [place] [address]    Gram Stain   Final    MODERATE WBC PRESENT, PREDOMINANTLY PMN FEW GRAM POSITIVE COCCI Performed at [facility], [place] [address] [place], [place] [address]    Culture   Final    FEW ENTEROCOCCUS FAECALIS SUSCEPTIBILITIES TO FOLLOW NO ANAEROBES ISOLATED; CULTURE IN PROGRESS FOR 5 DAYS    Report Status  PENDING  Incomplete  Aerobic/Anaerobic Culture (surgical/deep wound)     Status: Abnormal (Preliminary result)   Collection Time: 12/16/17  2:21 PM  Result Value Ref Range Status   Specimen Description   Final    WOUND LEFT HIP Performed at [facility], [address], [place],  [address]    Special Requests   Final  NONE Performed at [facility], [place], [place] [address]    Gram Stain   Final    ABUNDANT WBC PRESENT, PREDOMINANTLY PMN RARE GRAM POSITIVE COCCI Performed at [facility], [place] [address] [place], [place] [address]    Culture (A)  Final    MULTIPLE ORGANISMS PRESENT, NONE PREDOMINANT NO ANAEROBES ISOLATED; CULTURE IN PROGRESS FOR 5 DAYS    Report Status PENDING  Incomplete       Today   Subjective:   Marti Mclane today for going to hospice home.  Objective:   Blood pressure (!) 131/50, pulse 68, temperature 98 F (36.7 C), temperature source Oral, resp. rate 16, height 4' 11.02" (1.499 m), weight 51.7 kg (113 lb 15.7 oz), SpO2 98 %.   Intake/Output Summary (Last 24 hours) at 12/18/2017 1529 Last data filed at 12/18/2017 0820 Gross per 24 hour  Intake 1817.75 ml  Output -  Net 1817.75 ml    Exam Advanced  Dementia.full body contractures [place].AT,PERRAL Supple Neck,No JVD, No cervical lymphadenopathy appriciated.  Symmetrical Chest wall movement, Good air movement bilaterally, CTAB RRR,No Gallops,Rubs or new Murmurs, No Parasternal Heave +ve B.Sounds, Abd Soft, Non tender, No organomegaly appriciated, No rebound -guarding or rigidity. Multiple decubiti in both hips, sacrum, dressings present.  Data Review   CBC w Diff:  Lab Results  Component Value Date   WBC 10.1 12/18/2017   HGB 7.1 (L) 12/18/2017   HGB 13.0 01/01/2014   HCT 21.9 (L) 12/18/2017   HCT 37.5 01/01/2014   PLT 355 12/18/2017   PLT 238 01/01/2014   LYMPHOPCT 7 12/14/2017   LYMPHOPCT 6.8 01/01/2014   MONOPCT 6 12/14/2017    MONOPCT 3.8 01/01/2014   EOSPCT 1 12/14/2017   EOSPCT 0.4 01/01/2014   BASOPCT 0 12/14/2017   BASOPCT 0.2 01/01/2014    CMP:  Lab Results  Component Value Date   NA 138 12/18/2017   NA 137 01/01/2014   K 3.8 12/18/2017   K 3.6 01/01/2014   CL 103 12/18/2017   CL 106 01/01/2014   CO2 31 12/18/2017   CO2 25 01/01/2014   BUN 9 12/18/2017   BUN 11 01/01/2014   CREATININE 0.44 12/18/2017   CREATININE 0.63 01/01/2014   PROT 6.8 02/03/2017   ALBUMIN 3.9 02/03/2017   BILITOT 0.4 02/03/2017   ALKPHOS 86 02/03/2017   AST 19 02/03/2017   ALT 11 (L) 02/03/2017  .   Total Time in preparing paper work, data evaluation and todays exam - 35 minutes  Epifanio Lesches M.D on 12/18/2017 at 3:29 PM    Note: This dictation was prepared with Dragon dictation along with smaller phrase technology. Any transcriptional errors that result from this process are unintentional.

## 2017-12-18 NOTE — Clinical Social Work Note (Signed)
CSW received a very angry phone call from patient's grandson who did not provide his name and he began asking me if the wounds on his grandmother could have been prevented. He began asking me other medical questions and CSW explained to the grandson that this was out of the scope of my practice and that the physicians involved with patient would be able to state that they possibly could have been prevented or not but that in the end it would not be known for sure. CSW asked the grandson if he would like the complaint line in which he could file a formal grievance. He stated he would. CSW began providing the number and the grandson stated that this was the number he had just called and he was told to call the case manager at the hospital and ask them to report the concerns. CSW explained to the son that the physician could potentially answer some of his questions but that I had not been involved with his grandmother's care and plan. The grandson stated "why do I have to be the one to speak with the physician, can't you just walk down the hall and ask the physician yourself?" In an attempt to deescalate the situation, CSW stated that I would be happy to contact the physician and inform them of his concerns. CSW also began to provide the number for Dept of Social Services and he stated he did not need that phone number as he had already reported the facility to DSS and they were taking care of things on their end.  He then hung up on CSW. CSW has informed the physician of the grandson's phone call.  Shela Leff MSW,LCSW [phone]

## 2017-12-18 NOTE — Progress Notes (Signed)
New hospice home referral recieved from [place] following a Palliative Medicine consult. Patient is a 76 year old with past medical history of lewy body dementia, breast cancer, arthritis, sacral fracture, thyroid disease, chronic hip and sacral wounds who was admitted on 12/14/2017 with fever and altered mental status. She was found to have an infected left hip wound. Surgery was consulted and patient had surgical debridement of her wounds on 12/29. Blood cultures revealed multi-organism bacteremiaShe has continued with poor appetite and lethargy. Palliative medicine was consulted for goals of care and met with patient's son and daughter today, they have chosen to focus on comfort with transfer to the hospice home for end of life care. Writer met in the room with patient's son Jakai Onofre to initiate education regarding hospice services, phil;osophy and team approach to care with good understanding voiced. Questions answered, consents signed. Patient information faxed to referral, report called to the hospice home. EMS notified for transport. Hospital team and family updated. Flo Shanks RN, BSN, [facility], hospital Liaison [phone]

## 2017-12-18 NOTE — Progress Notes (Signed)
[facility] responded to an OR for prayer. Pt's son was bedside. Son stated that if his mother was to be discharged, it would be to hospice. Son indicated this was not a good time for a visit. [facility] will make a follow up visit if desired.    12/18/17 1200  Clinical Encounter Type  Visited With Family;Health care provider  Visit Type Initial;Spiritual support  Referral From Palliative care team  Consult/Referral To Chaplain

## 2017-12-18 NOTE — Progress Notes (Signed)
[facility] NAME: Julia Alvarado    MR#:  [id number]  DATE OF BIRTH:  Jan 22, 1941  On comfort care.  CHIEF COMPLAINT:   Chief Complaint  Patient presents with  . Fever   REVIEW OF SYSTEMS:   Severe dementia and also constantly mumbling  According to family she is more alert today.  DRUG ALLERGIES:   Allergies  Allergen Reactions  . Cefdinir Other (See Comments)    Liquid form causes oral thrush  . Codeine Nausea And Vomiting    nausea  . Ibandronic Acid Nausea Only  . Latex Hives  . Nitrofurantoin     Other reaction(s): Vomiting  . Raloxifene Other (See Comments)    Per pt, "eye doctor said I cannot take" Per pt, "eye doctor said I cannot take" Per pt, "eye doctor said I cannot take"    VITALS:  Blood pressure (!) 131/50, pulse 68, temperature 98 F (36.7 C), temperature source Oral, resp. rate 16, height 4' 11.02" (1.499 m), weight 51.7 kg (113 lb 15.7 oz), SpO2 98 %.  PHYSICAL EXAMINATION:  GENERAL:  76 y.o.-year-old patient lying in the bed with no acute distress.  EYES: Pupils equal, round, reactive to light  No scleral icterus. Extraocular muscles intact.  HEENT: Head atraumatic, normocephalic. Oropharynx and nasopharynx clear.  NECK:  Supple, no jugular venous distention. No thyroid enlargement, no tenderness.  LUNGS: Normal breath sounds bilaterally, no wheezing, rales,rhonchi or crepitation. No use of accessory muscles of respiration.  CARDIOVASCULAR: S1, S2 normal. No murmurs, rubs, or gallops.  ABDOMEN: Soft, nontender, nondistended. Bowel sounds present. No organomegaly or mass.  EXTREMITIES: Contractures of lower extremities and upper extremities. NEUROLOGIC: Unable to do full neurological exam because of dementia and not following commands.  PSYCHIATRIC: T demented, not oriented and nonverbal. 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 LABORATORY PANEL:   CBC Recent Labs  Lab 12/18/17 0548  WBC 10.1  HGB 7.1*  HCT 21.9*  PLT 355   ------------------------------------------------------------------------------------------------------------------  Chemistries  Recent Labs  Lab 12/18/17 0548  NA 138  K 3.8  CL 103  CO2 31  GLUCOSE 109*  BUN 9  CREATININE 0.44  CALCIUM 7.7*   ------------------------------------------------------------------------------------------------------------------  Cardiac Enzymes Recent Labs  Lab 12/14/17 1423  TROPONINI 0.04*   ------------------------------------------------------------------------------------------------------------------  RADIOLOGY:  No results found.  EKG:   Orders placed or performed during the hospital encounter of 03/03/17  . ED EKG  . ED EKG  . EKG 12-Lead  . EKG 12-Lead  . EKG 12-Lead  . EKG 12-Lead  . EKG 12-Lead  . EKG 12-Lead  . EKG 12-Lead  . EKG 12-Lead  . EKG 12-Lead  . EKG 12-Lead  . EKG    ASSESSMENT AND PLAN:   #1  Polymicrobial bacteremia including group B strep, E. coli, enterococci, Proteus species.  secondary to infected decubiti.   Status post debridement of multiple times including right hip decubiti, sacral decubiti, left hip decubiti, purulent drainage drained from left hip, patient has sinus tract on the left femur and greater trochanter.  Sizes are documented in the OR note by Dr. Burt Knack. Follow wound cultures from the OR.   comfort care at this time, appreciate palliative care following the patient.  #2 Alzheimer's dementia: Patient mumbling at baseline, nonverbal, his body contractures.  .  Continue comfort care measures,  transfer to hospice home when arrangements are made. On morphine and atrivan .  #3 essential hypertension: 4.  DNR     Prognosis is really poor secondary to advanced age, dementia, recommend palliative care.,  patient's family wants everything to be done Discussed with patient's daughter, son,    All the records are reviewed and case discussed with Care Management/Social Workerr. Management plans discussed with the patient, family and they are in agreement.  CODE STATUS: DNR  TOTAL TIME TAKING CARE OF THIS PATIENT: 35 minutes.   POSSIBLE D/C IN 1-2 DAYS, DEPENDING ON CLINICAL CONDITION.   Epifanio Lesches M.D on 12/18/2017 at 11:33 AM  Between 7am to 6pm - Pager - [phone]  After 6pm go to [email or web address] - password [email or web address] [facility]  Office  [phone]  CC: Primary care physician; Julia Castle, MD   Note: This dictation was prepared with Dragon dictation along with smaller phrase technology. Any transcriptional errors that result from this process are unintentional.

## 2017-12-19 LAB — TYPE AND SCREEN
ABO/RH(D): A POS
Antibody Screen: NEGATIVE
UNIT DIVISION: 0

## 2017-12-19 LAB — CULTURE, BLOOD (ROUTINE X 2): SPECIAL REQUESTS: ADEQUATE

## 2017-12-19 LAB — BPAM RBC
BLOOD PRODUCT EXPIRATION DATE: 201901052359
Unit Type and Rh: 5100

## 2017-12-19 LAB — PREPARE RBC (CROSSMATCH)

## 2017-12-20 LAB — CULTURE, BLOOD (SINGLE)
CULTURE: NO GROWTH
SPECIAL REQUESTS: ADEQUATE

## 2017-12-20 LAB — SURGICAL PATHOLOGY

## 2017-12-21 LAB — AEROBIC/ANAEROBIC CULTURE (SURGICAL/DEEP WOUND)

## 2017-12-21 LAB — AEROBIC/ANAEROBIC CULTURE W GRAM STAIN (SURGICAL/DEEP WOUND)

## 2017-12-21 NOTE — Anesthesia Postprocedure Evaluation (Signed)
Anesthesia Post Note  Patient: SETSUKO ROBINS  Procedure(s) Performed: debridement multiple decubitus ulcers (Bilateral )  Patient location during evaluation: PACU Anesthesia Type: General Level of consciousness: awake and alert Pain management: pain level controlled Vital Signs Assessment: post-procedure vital signs reviewed and stable Respiratory status: spontaneous breathing, nonlabored ventilation, respiratory function stable and patient connected to nasal cannula oxygen Cardiovascular status: blood pressure returned to baseline and stable Postop Assessment: no apparent nausea or vomiting Anesthetic complications: no     Last Vitals:  Vitals:   12/17/17 1559 12/18/17 0426  BP: (!) 120/58 (!) 131/50  Pulse: 66 68  Resp: 16   Temp: 36.7 C 36.7 C  SpO2: 96% 98%    Last Pain:  Vitals:   12/18/17 1147  TempSrc:   PainSc: [place]

## 2018-01-08 IMAGING — CT CT HEAD W/O CM
3 series · 15 of 46 positions shown, 18 images · non-contrast
Comparison: 12/17/2016

CLINICAL DATA: CVA.  Increased confusion.

EXAM:
CT HEAD WITHOUT CONTRAST
TECHNIQUE: Contiguous axial images were obtained from the base of the skull
through the vertex without intravenous contrast.

[Series 2: head wo · axial · 0.47mm/px · z∈[-136,-16]mm · 9 of 29 slices shown, 12 images]
[im 3/29  brain]
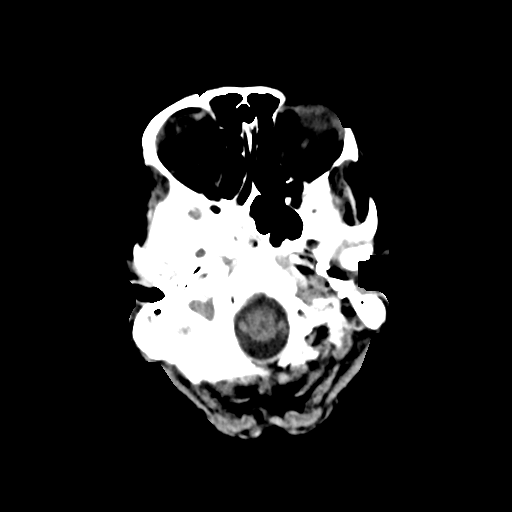
[im 3/29  bone]
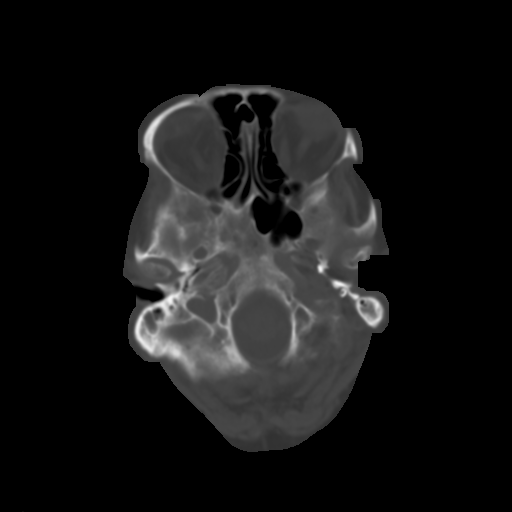
[im 6/29  brain]
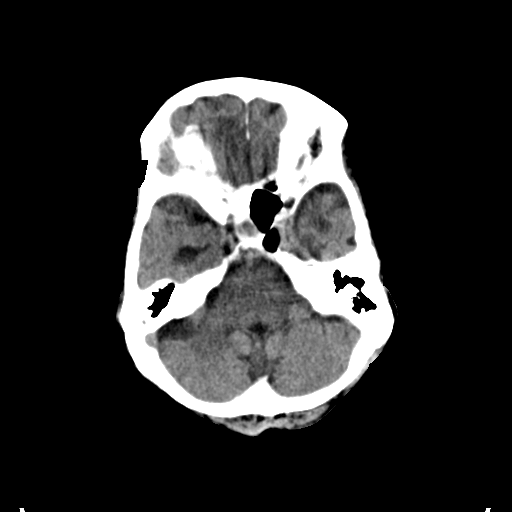
[im 9/29  brain]
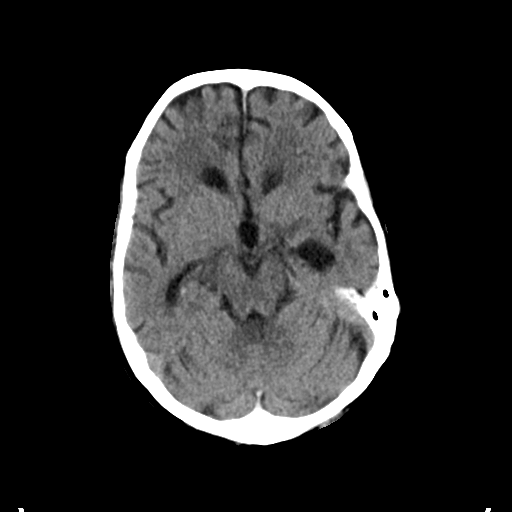
[im 12/29  brain]
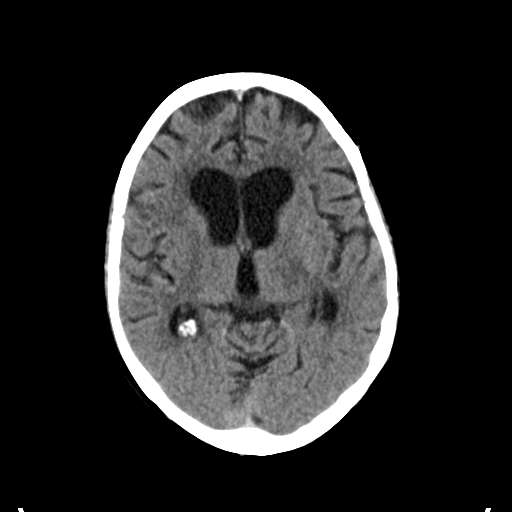
[im 15/29  brain]
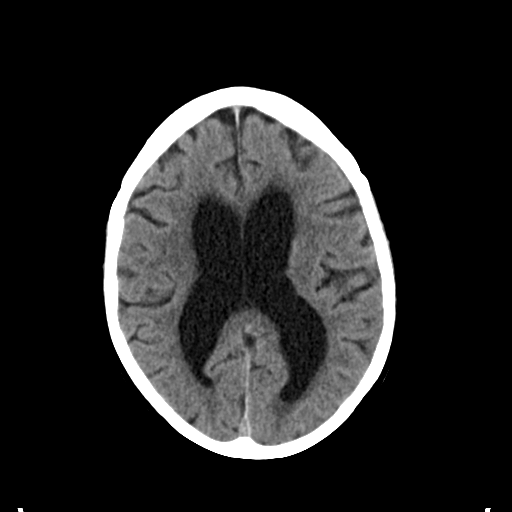
[im 15/29  bone]
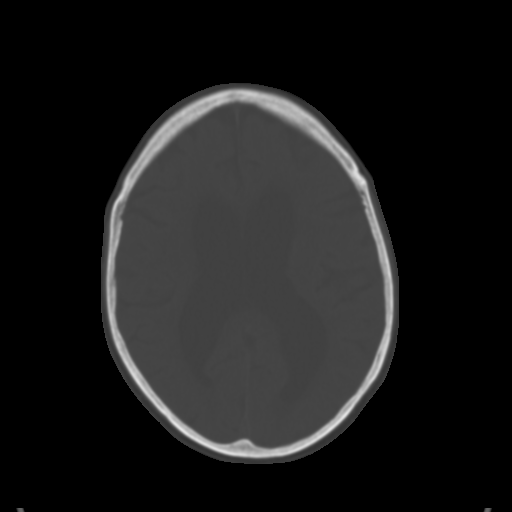
[im 18/29  brain]
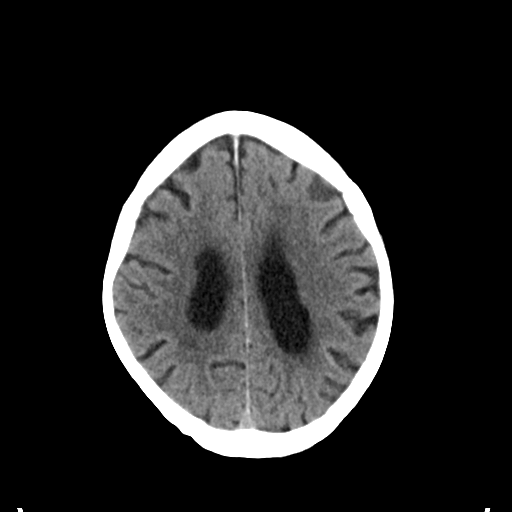
[im 21/29  brain]
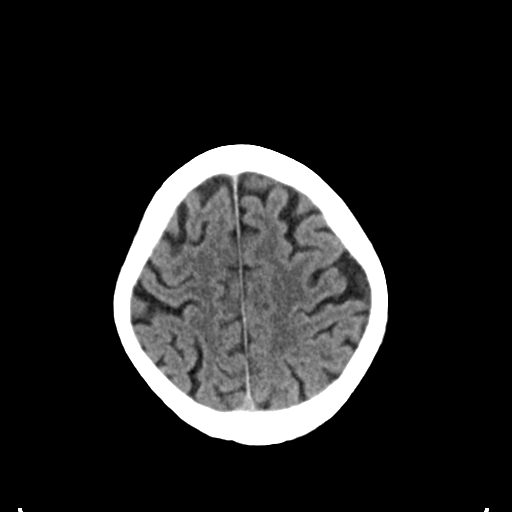
[im 24/29  brain]
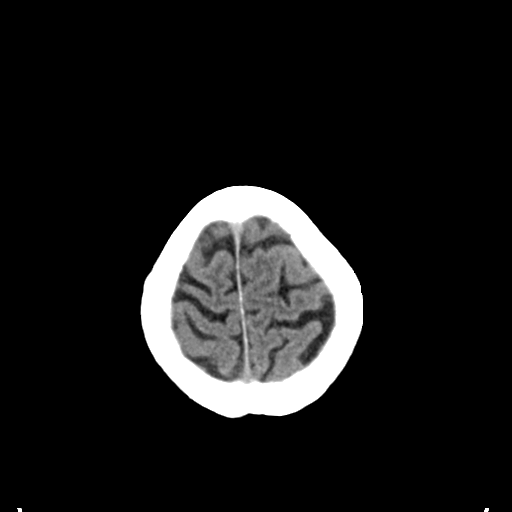
[im 27/29  brain]
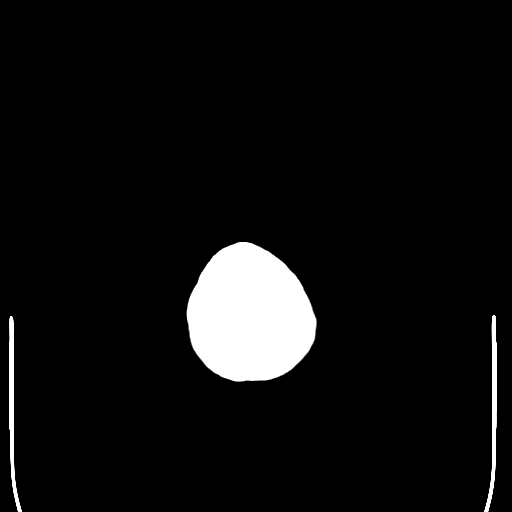
[im 27/29  bone]
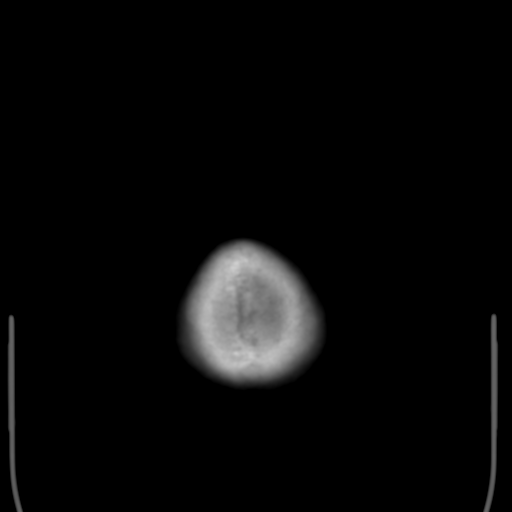

[Series 4: coronal soft tissue · coronal · 0.29mm/px · 3 of 59 slices shown]
[im 20/59  brain]
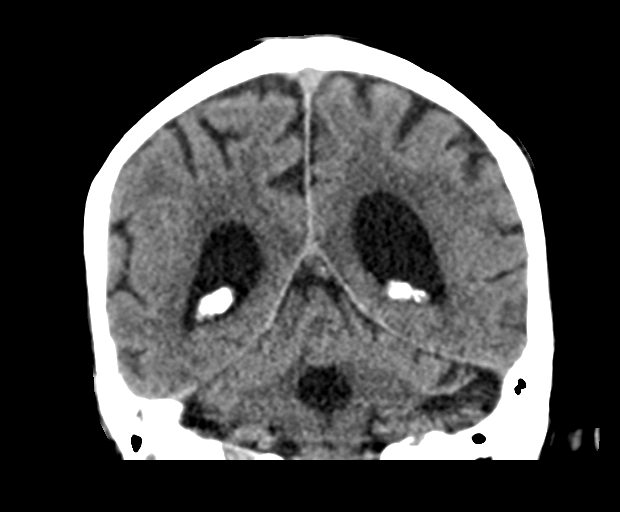
[im 26/59  brain]
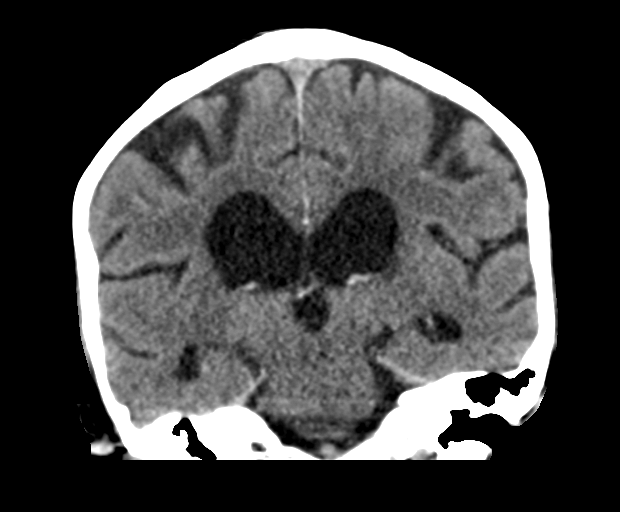
[im 33/59  brain]
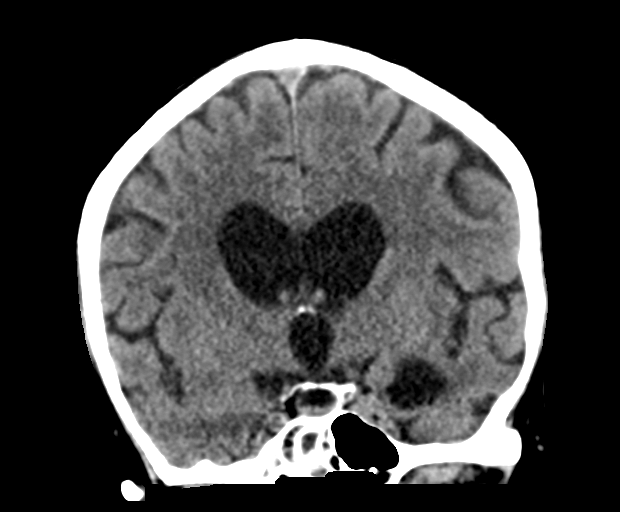

[Series 5: sagittal soft tissue · sagittal · 0.30mm/px · 3 of 46 slices shown]
[im 16/46  brain]
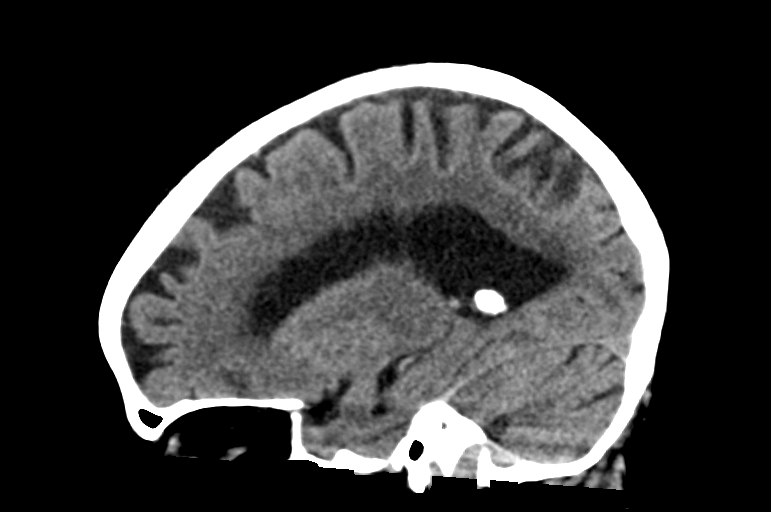
[im 23/46  brain]
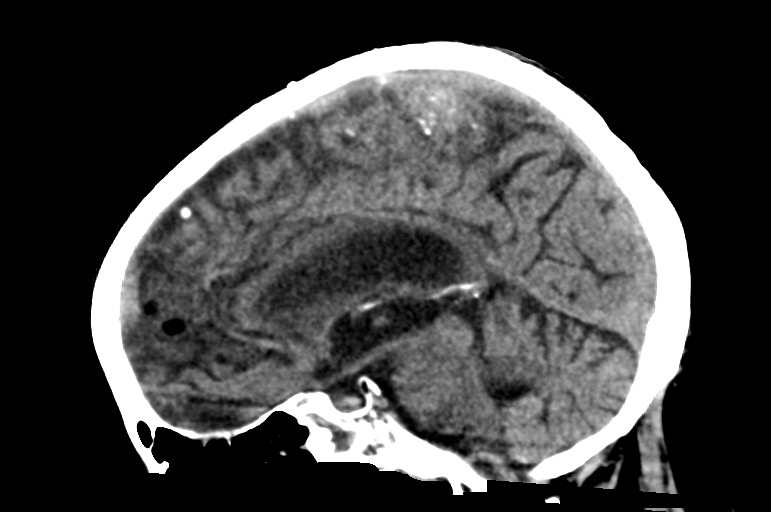
[im 31/46  brain]
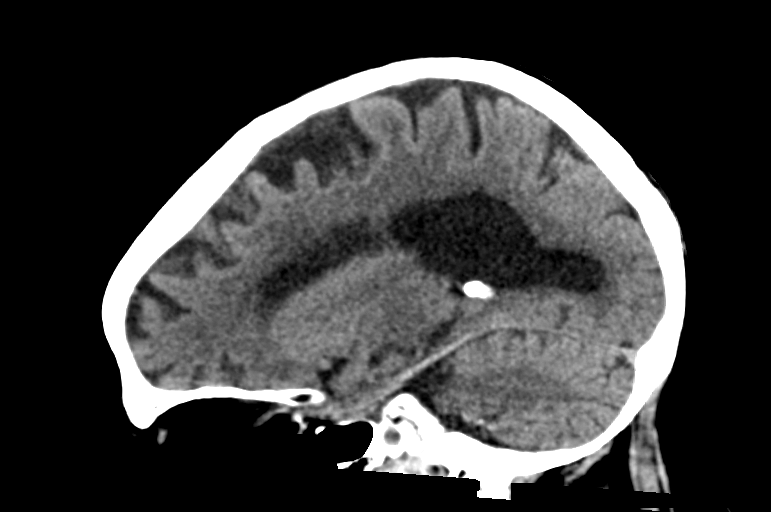

[15 of 46 positions shown; findings below may reference images not displayed]

FINDINGS: Brain: No evidence of acute infarction, hemorrhage, hydrocephalus,
extra-axial collection or mass lesion/mass effect.

Atrophy with ventriculomegaly. Gliosis in the left temporal pole and
inferior right frontal lobes, likely posttraumatic.

Vascular: No hyperdense vessel or unexpected calcification.

Skull: No acute finding

Sinuses/Orbits: No acute finding
IMPRESSION: 1. No acute finding.
2. Atrophy with ventriculomegaly.
3. Left temporal pole and inferior right frontal gliosis, likely
posttraumatic.

## 2018-01-19 DEATH — deceased

## 2018-03-30 NOTE — Progress Notes (Deleted)
04/02/2018 8:24 AM   Julia Alvarado August 14, 1941 [id number]  Referring provider: Valera Alvarado, Julia Alvarado, Julia Alvarado  No chief complaint on file.   HPI: 77 yo WF who presents today for a 12 month follow up for lower urinary tract symptoms, vaginal atrophy and incontinence with Julia Alvarado who provides transportation for [employer].  Julia Alvarado cannot give a history.  ***  Background history Patient is a 31 -year-old Caucasian female who is referred to [place] by, Dr. Kym Alvarado, for recurrent urinary tract infections who presents with her transportation, Julia Alvarado from [facility] care home.  Patient is a poor historian.  Patient states that she has had two to three urinary tract infections over the last year.  Her symptoms with a urinary tract infection consist of burning in the vaginal area and hurting in the bottom of her stomach.  She denies gross hematuria.  She has not had any recent fevers, chills, nausea or vomiting.  She does not have a history of nephrolithiasis, GU surgery or GU trauma.  Reviewing her records,  she has had one documented positive urine culture for E. Coli in 2016 with a variable resistance pattern.  She is not sexually active.  She is post menopausal.   She has a history of breast cancer.  She admits to constipation and/or diarrhea.   She does not engage in good perineal hygiene.  She wears pull ups at all time.  She does not take tub baths.   She has incontinence.  She has had a contrast CT on 11/24/2016 which noted no acute finding to explain abdominal pain.  Extensive colonic diverticulosis.  I have independently reviewed the films.  She is drinking 5 glasses of water daily.  Pepsi once in awhile.  She has cranberry juice when she has UTI symptoms.  Her baseline urinary symptoms consist of frequency, dysuria, incontinence, intermittency and hesitancy.  Her CATH UA was malordous, >30 WBC's and moderate bacteria.  Her PVR was 233 mL.   She is  complaining of burning in her vaginal area, headache and hurting in her lower stomach today.  Recurrent urinary tract infections Patient has had 3 documented positive urine cultures since she was last seen by [place] in January 2018.  Cultures have been positive for Escherichia coli with increasing resistance patterns.  Risk factors for recurrent urinary tract infections are dementia, vaginal atrophy and incontinence.  It is unknown if she has had any UTI's, since she was last seen by [place].  She is taking Vitamin C and probiotics.    Vaginal atrophy Patient does not a candidate for vaginal estrogen therapy due to her history of bladder cancer.  She is taking probiotics.    Incontinence She is wearing pads.  Her PVR today is 121 mL.        PMH: Past Medical History:  Diagnosis Date  . Arthritis   . Breast cancer ([place])   . Breast cancer ([place])   . GERD (gastroesophageal reflux disease)   . Hyperlipemia   . Hypertension   . Hypothyroidism   . Lewy body dementia without behavioral disturbance   . Sacral fracture ([place]) 1/15    Surgical History: Past Surgical History:  Procedure Laterality Date  . ABDOMINAL HYSTERECTOMY    . CATARACT EXTRACTION W/ INTRAOCULAR LENS IMPLANT Right   . CATARACT EXTRACTION W/PHACO Right 06/15/2016   Procedure: CATARACT EXTRACTION PHACO AND INTRAOCULAR LENS PLACEMENT (IOC);  Surgeon: Julia Koyanagi, MD;  Location: [facility];  Service: Ophthalmology;  Laterality: Right;  . FEMUR IM NAIL Left 03/04/2017   Procedure: INTRAMEDULLARY (IM) NAIL FEMORAL;  Surgeon: Julia Knows, MD;  Location: [facility] ORS;  Service: Orthopedics;  Laterality: Left;  Long Affixus, needs C-Arm  . INCISION AND DRAINAGE ABSCESS Bilateral 12/16/2017   Procedure: debridement multiple decubitus ulcers;  Surgeon: Julia Glen, MD;  Location: [facility] ORS;  Service: General;  Laterality: Bilateral;  . MASTECTOMY Right ~2005  . MASTECTOMY    . RETINAL DETACHMENT REPAIR W/ SCLERAL BUCKLE  LE      Home Medications:  Allergies as of 04/02/2018      Reactions   Cefdinir Other (See Comments)   Liquid form causes oral thrush   Codeine Nausea And Vomiting   nausea   Ibandronic Acid Nausea Only   Latex Hives   Nitrofurantoin    Other reaction(s): Vomiting   Raloxifene Other (See Comments)   Per pt, "eye doctor said I cannot take" Per pt, "eye doctor said I cannot take" Per pt, "eye doctor said I cannot take"      Medication List        Accurate as of 03/30/18  8:24 AM. Always use your most recent med list.          glycopyrrolate 0.2 MG/ML injection Commonly known as:  ROBINUL Inject 1 mL (0.2 mg total) into the skin every 4 (four) hours as needed (excessive secretions).   glycopyrrolate 1 MG tablet Commonly known as:  ROBINUL Take 1 tablet (1 mg total) by mouth every 4 (four) hours as needed (excessive secretions).   haloperidol 2 MG/ML solution Commonly known as:  HALDOL Place 0.3 mLs (0.6 mg total) under the tongue every 4 (four) hours as needed for agitation (or delirium).   LORazepam 2 MG/ML concentrated solution Commonly known as:  ATIVAN Place 0.5 mLs (1 mg total) under the tongue every 4 (four) hours as needed for anxiety.   morphine CONCENTRATE 10 MG/0.5ML Soln concentrated solution Place 0.25 mLs (5 mg total) under the tongue every 2 (two) hours as needed for moderate pain (or dyspnea).   Valproate Sodium 250 MG/5ML Soln solution Commonly known as:  DEPAKENE Take 5-10 mLs by mouth 3 (three) times daily. Take 10 ML in the morning, 10 ML at 2 pm and 5 ML bedtime.       Allergies:  Allergies  Allergen Reactions  . Cefdinir Other (See Comments)    Liquid form causes oral thrush  . Codeine Nausea And Vomiting    nausea  . Ibandronic Acid Nausea Only  . Latex Hives  . Nitrofurantoin     Other reaction(s): Vomiting  . Raloxifene Other (See Comments)    Per pt, "eye doctor said I cannot take" Per pt, "eye doctor said I cannot take" Per pt,  "eye doctor said I cannot take"    Family History: Family History  Problem Relation Age of Onset  . Heart disease Father   . Cancer Brother        all except 1 died of cancer    Social History:  reports that she has never smoked. She has never used smokeless tobacco. She reports that she does not drink alcohol or use drugs.  ROS: Unable to obtain due to dementia.  Physical Exam: There were no vitals taken for this visit.  Constitutional: Well nourished. Alert and oriented, No acute distress. HEENT: [place] AT, moist mucus membranes. Trachea midline, no masses. Cardiovascular: No clubbing, cyanosis, or edema. Respiratory:  Normal respiratory effort, no increased work of breathing. GI: Abdomen is soft, non tender, non distended, no abdominal masses. Liver and spleen not palpable.  No hernias appreciated.  Stool sample for occult testing is not indicated.   GU: No CVA tenderness.  No bladder fullness or masses.   Skin: No rashes, bruises or suspicious lesions. Lymph: No cervical or inguinal adenopathy. Neurologic: Grossly intact, no focal deficits, moving all 4 extremities. Psychiatric: Normal mood and affect.  Laboratory Data: Lab Results  Component Value Date   WBC 10.1 12/18/2017   HGB 7.1 (L) 12/18/2017   HCT 21.9 (L) 12/18/2017   MCV 101.3 (H) 12/18/2017   PLT 355 12/18/2017    Lab Results  Component Value Date   CREATININE 0.44 12/18/2017     Lab Results  Component Value Date   HGBA1C 5.3 03/04/2017    Lab Results  Component Value Date   TSH 47.074 (H) 12/16/2016     Lab Results  Component Value Date   AST 19 02/03/2017   Lab Results  Component Value Date   ALT 11 (L) 02/03/2017   I have reviewed the labs.  Pertinent Imaging:   Assessment & Plan:    1. Recurrent UTI's  - unknown if patient has had any UTI's since she was last seen with [place]  2. Vaginal atrophy  - Patient is not a candidate for vaginal estrogen cream due to her history of breast  cancer    - patient to continue probiotics   - likely the cause of her "burning"- advised the use of olive oil or coconut oil for discomfort  3. Incontinence  - managed with pull ups                          No follow-ups on file.  These notes generated with voice recognition software. I apologize for typographical errors.  Zara Council, [facility] [address] [place], [place] [address] [phone]

## 2018-04-02 ENCOUNTER — Ambulatory Visit: Payer: Medicare Other | Admitting: Urology

## 2018-04-30 ENCOUNTER — Ambulatory Visit: Payer: PRIVATE HEALTH INSURANCE | Admitting: Urology
